# Patient Record
Sex: Male | Born: 1940
Health system: Southern US, Community
[De-identification: ages and names within clinical notes are randomized; demographics above are authoritative.]

## PROBLEM LIST (undated history)

## (undated) DIAGNOSIS — I2510 Atherosclerotic heart disease of native coronary artery without angina pectoris: Secondary | ICD-10-CM

## (undated) DIAGNOSIS — I509 Heart failure, unspecified: Secondary | ICD-10-CM

## (undated) DIAGNOSIS — H35341 Macular cyst, hole, or pseudohole, right eye: Secondary | ICD-10-CM

## (undated) DIAGNOSIS — R519 Headache, unspecified: Secondary | ICD-10-CM

## (undated) DIAGNOSIS — H35342 Macular cyst, hole, or pseudohole, left eye: Secondary | ICD-10-CM

## (undated) DIAGNOSIS — R112 Nausea with vomiting, unspecified: Secondary | ICD-10-CM

## (undated) DIAGNOSIS — J301 Allergic rhinitis due to pollen: Secondary | ICD-10-CM

## (undated) DIAGNOSIS — J45909 Unspecified asthma, uncomplicated: Secondary | ICD-10-CM

## (undated) DIAGNOSIS — R51 Headache: Secondary | ICD-10-CM

## (undated) DIAGNOSIS — I10 Essential (primary) hypertension: Secondary | ICD-10-CM

## (undated) DIAGNOSIS — Z9889 Other specified postprocedural states: Secondary | ICD-10-CM

## (undated) DIAGNOSIS — H33001 Unspecified retinal detachment with retinal break, right eye: Secondary | ICD-10-CM

## (undated) DIAGNOSIS — K429 Umbilical hernia without obstruction or gangrene: Secondary | ICD-10-CM

## (undated) DIAGNOSIS — I219 Acute myocardial infarction, unspecified: Secondary | ICD-10-CM

## (undated) DIAGNOSIS — G373 Acute transverse myelitis in demyelinating disease of central nervous system: Secondary | ICD-10-CM

## (undated) DIAGNOSIS — Z95810 Presence of automatic (implantable) cardiac defibrillator: Secondary | ICD-10-CM

## (undated) DIAGNOSIS — G709 Myoneural disorder, unspecified: Secondary | ICD-10-CM

## (undated) HISTORY — DX: Macular cyst, hole, or pseudohole, right eye: H35.341

## (undated) HISTORY — PX: EYE SURGERY: SHX253

## (undated) HISTORY — DX: Unspecified retinal detachment with retinal break, right eye: H33.001

## (undated) HISTORY — DX: Macular cyst, hole, or pseudohole, left eye: H35.342

---

## 2007-07-06 DIAGNOSIS — G373 Acute transverse myelitis in demyelinating disease of central nervous system: Secondary | ICD-10-CM

## 2007-07-06 HISTORY — DX: Acute transverse myelitis in demyelinating disease of central nervous system: G37.3

## 2007-12-21 ENCOUNTER — Emergency Department (HOSPITAL_COMMUNITY): Admission: EM | Admit: 2007-12-21 | Discharge: 2007-12-21 | Payer: Self-pay | Admitting: Emergency Medicine

## 2008-06-12 ENCOUNTER — Encounter: Admission: RE | Admit: 2008-06-12 | Discharge: 2008-06-12 | Payer: Self-pay | Admitting: Neurology

## 2010-07-27 ENCOUNTER — Encounter: Payer: Self-pay | Admitting: Neurology

## 2011-04-01 LAB — DIFFERENTIAL
Basophils Absolute: 0.1
Basophils Relative: 1
Eosinophils Absolute: 0.3
Eosinophils Relative: 2
Lymphocytes Relative: 22
Lymphs Abs: 3.7
Monocytes Absolute: 1.2 — ABNORMAL HIGH
Monocytes Relative: 7
Neutro Abs: 11.6 — ABNORMAL HIGH
Neutrophils Relative %: 69

## 2011-04-01 LAB — CBC
HCT: 45.8
Hemoglobin: 15.9
MCHC: 34.8
MCV: 91.7
Platelets: 229
RBC: 4.99
RDW: 12.2
WBC: 17 — ABNORMAL HIGH

## 2011-04-01 LAB — POCT I-STAT, CHEM 8
BUN: 17
Calcium, Ion: 1.09 — ABNORMAL LOW
Chloride: 105
Creatinine, Ser: 1.1
Glucose, Bld: 100 — ABNORMAL HIGH
HCT: 47
Hemoglobin: 16
Potassium: 4.4
Sodium: 138
TCO2: 26

## 2011-04-01 LAB — URINALYSIS, ROUTINE W REFLEX MICROSCOPIC
Bilirubin Urine: NEGATIVE
Glucose, UA: NEGATIVE
Hgb urine dipstick: NEGATIVE
Ketones, ur: NEGATIVE
Nitrite: NEGATIVE
Protein, ur: NEGATIVE
Specific Gravity, Urine: 1.029
Urobilinogen, UA: 1
pH: 6

## 2011-04-01 LAB — URINE MICROSCOPIC-ADD ON

## 2011-04-01 LAB — URINE CULTURE: Colony Count: 4000

## 2011-12-22 ENCOUNTER — Encounter (INDEPENDENT_AMBULATORY_CARE_PROVIDER_SITE_OTHER): Payer: Medicare Other | Admitting: Ophthalmology

## 2011-12-22 DIAGNOSIS — H35379 Puckering of macula, unspecified eye: Secondary | ICD-10-CM

## 2011-12-22 DIAGNOSIS — H2510 Age-related nuclear cataract, unspecified eye: Secondary | ICD-10-CM

## 2011-12-22 DIAGNOSIS — H43819 Vitreous degeneration, unspecified eye: Secondary | ICD-10-CM

## 2011-12-22 DIAGNOSIS — H35349 Macular cyst, hole, or pseudohole, unspecified eye: Secondary | ICD-10-CM

## 2011-12-23 DIAGNOSIS — H35342 Macular cyst, hole, or pseudohole, left eye: Secondary | ICD-10-CM

## 2011-12-23 HISTORY — DX: Macular cyst, hole, or pseudohole, left eye: H35.342

## 2011-12-23 NOTE — H&P (Signed)
Robert [name] is an 71 y.o. male.   Chief Complaint:loss of vision left eye HPI: recent vision loss from macular hole  No past medical history on file.  No past surgical history on file.  No family history on file. Social History:  does not have a smoking history on file. He does not have any smokeless tobacco history on file. His alcohol and drug histories not on file.  Allergies: Allergies not on file  No prescriptions prior to admission    Review of systems otherwise negative  There were no vitals taken for this visit.  Physical exam: Mental status: oriented x3. Eyes: See eye exam associated with this date of surgery in media tab.  Scanned in by scanning center Ears, Nose, Throat: within normal limits Neck: Within Normal limits General: within normal limits Chest: Within normal limits Breast: deferred Heart: Within normal limits Abdomen: Within normal limits GU: deferred Extremities: within normal limits Skin: within normal limits  Assessment/Plan Macular hole type II b Plan: To [facility] for pars plana vitretcomy, laser treatment, gas injection, serum patch left eye  Robert [name] 12/23/2011, 8:25 AM

## 2012-01-05 ENCOUNTER — Encounter (HOSPITAL_COMMUNITY): Payer: Self-pay | Admitting: Pharmacy Technician

## 2012-01-14 ENCOUNTER — Encounter (HOSPITAL_COMMUNITY): Payer: Self-pay | Admitting: *Deleted

## 2012-01-17 MED ORDER — TROPICAMIDE 1 % OP SOLN
1.0000 [drp] | OPHTHALMIC | Status: AC | PRN
  Administered 2012-01-18 (×3): 1 [drp] via OPHTHALMIC
  Filled 2012-01-17: qty 3

## 2012-01-17 MED ORDER — GATIFLOXACIN 0.5 % OP SOLN
1.0000 [drp] | OPHTHALMIC | Status: AC | PRN
  Administered 2012-01-18 (×3): 1 [drp] via OPHTHALMIC
  Filled 2012-01-17: qty 2.5

## 2012-01-17 MED ORDER — CYCLOPENTOLATE HCL 1 % OP SOLN
1.0000 [drp] | OPHTHALMIC | Status: AC | PRN
  Administered 2012-01-18 (×3): 1 [drp] via OPHTHALMIC
  Filled 2012-01-17: qty 2

## 2012-01-17 MED ORDER — PHENYLEPHRINE HCL 2.5 % OP SOLN
1.0000 [drp] | OPHTHALMIC | Status: AC | PRN
  Administered 2012-01-18 (×3): 1 [drp] via OPHTHALMIC
  Filled 2012-01-17: qty 3

## 2012-01-17 MED ORDER — CEFAZOLIN SODIUM-DEXTROSE 2-3 GM-% IV SOLR
2.0000 g | INTRAVENOUS | Status: AC
  Administered 2012-01-18: 2 g via INTRAVENOUS
  Filled 2012-01-17: qty 50

## 2012-01-18 ENCOUNTER — Encounter (HOSPITAL_COMMUNITY): Payer: Self-pay | Admitting: Anesthesiology

## 2012-01-18 ENCOUNTER — Ambulatory Visit (HOSPITAL_COMMUNITY): Payer: Medicare Other | Admitting: Anesthesiology

## 2012-01-18 ENCOUNTER — Ambulatory Visit (HOSPITAL_COMMUNITY)
Admission: RE | Admit: 2012-01-18 | Discharge: 2012-01-19 | Disposition: A | Discharge status: Home / Self Care | Payer: Medicare Other | Source: Ambulatory Visit | Attending: Ophthalmology | Admitting: Ophthalmology

## 2012-01-18 ENCOUNTER — Encounter (HOSPITAL_COMMUNITY)
Admission: RE | Disposition: A | Discharge status: Home / Self Care | Payer: Self-pay | Source: Ambulatory Visit | Attending: Ophthalmology

## 2012-01-18 ENCOUNTER — Ambulatory Visit (HOSPITAL_COMMUNITY): Payer: Medicare Other

## 2012-01-18 ENCOUNTER — Encounter (HOSPITAL_COMMUNITY): Payer: Self-pay | Admitting: General Practice

## 2012-01-18 DIAGNOSIS — H35342 Macular cyst, hole, or pseudohole, left eye: Secondary | ICD-10-CM

## 2012-01-18 DIAGNOSIS — H35349 Macular cyst, hole, or pseudohole, unspecified eye: Secondary | ICD-10-CM | POA: Insufficient documentation

## 2012-01-18 DIAGNOSIS — F17200 Nicotine dependence, unspecified, uncomplicated: Secondary | ICD-10-CM | POA: Insufficient documentation

## 2012-01-18 DIAGNOSIS — K219 Gastro-esophageal reflux disease without esophagitis: Secondary | ICD-10-CM | POA: Insufficient documentation

## 2012-01-18 HISTORY — DX: Umbilical hernia without obstruction or gangrene: K42.9

## 2012-01-18 HISTORY — DX: Acute transverse myelitis in demyelinating disease of central nervous system: G37.3

## 2012-01-18 HISTORY — PX: PARS PLANA VITRECTOMY: SHX2166

## 2012-01-18 HISTORY — DX: Allergic rhinitis due to pollen: J30.1

## 2012-01-18 LAB — CBC
HCT: 41.6 % (ref 39.0–52.0)
Hemoglobin: 14.7 g/dL (ref 13.0–17.0)
MCH: 32.5 pg (ref 26.0–34.0)
MCHC: 35.3 g/dL (ref 30.0–36.0)
MCV: 91.8 fL (ref 78.0–100.0)
Platelets: 186 10*3/uL (ref 150–400)
RBC: 4.53 MIL/uL (ref 4.22–5.81)
RDW: 12.6 % (ref 11.5–15.5)
WBC: 13.4 10*3/uL — ABNORMAL HIGH (ref 4.0–10.5)

## 2012-01-18 LAB — SURGICAL PCR SCREEN
MRSA, PCR: NEGATIVE
Staphylococcus aureus: NEGATIVE

## 2012-01-18 LAB — AUTOLOGOUS SERUM PATCH PREP

## 2012-01-18 SURGERY — PARS PLANA VITRECTOMY WITH 25 GAUGE
Anesthesia: General | Site: Eye | Laterality: Left | Wound class: Clean

## 2012-01-18 MED ORDER — DEXAMETHASONE SODIUM PHOSPHATE 10 MG/ML IJ SOLN
INTRAMUSCULAR | Status: AC
  Filled 2012-01-18: qty 1

## 2012-01-18 MED ORDER — LACTATED RINGERS IV SOLN
INTRAVENOUS | Status: DC | PRN
  Administered 2012-01-18 (×2): via INTRAVENOUS

## 2012-01-18 MED ORDER — MUPIROCIN 2 % EX OINT
TOPICAL_OINTMENT | Freq: Two times a day (BID) | CUTANEOUS | Status: DC
  Filled 2012-01-18: qty 22

## 2012-01-18 MED ORDER — GENTAMICIN SULFATE 40 MG/ML IJ SOLN
INTRAMUSCULAR | Status: AC
  Filled 2012-01-18: qty 2

## 2012-01-18 MED ORDER — MAGNESIUM HYDROXIDE 400 MG/5ML PO SUSP: 15.0000 mL | Freq: Four times a day (QID) | ORAL | Status: DC | PRN

## 2012-01-18 MED ORDER — LATANOPROST 0.005 % OP SOLN
1.0000 [drp] | Freq: Every day | OPHTHALMIC | Status: DC
  Filled 2012-01-18: qty 2.5

## 2012-01-18 MED ORDER — HYDROMORPHONE HCL PF 1 MG/ML IJ SOLN
0.2500 mg | INTRAMUSCULAR | Status: DC | PRN
  Administered 2012-01-18: 0.5 mg via INTRAVENOUS

## 2012-01-18 MED ORDER — GATIFLOXACIN 0.5 % OP SOLN
1.0000 [drp] | Freq: Four times a day (QID) | OPHTHALMIC | Status: DC
  Filled 2012-01-18: qty 2.5

## 2012-01-18 MED ORDER — BRIMONIDINE TARTRATE 0.2 % OP SOLN
1.0000 [drp] | Freq: Two times a day (BID) | OPHTHALMIC | Status: DC
  Filled 2012-01-18: qty 5

## 2012-01-18 MED ORDER — PHENYLEPHRINE HCL 10 MG/ML IJ SOLN
INTRAMUSCULAR | Status: DC | PRN
  Administered 2012-01-18 (×2): 80 ug via INTRAVENOUS
  Administered 2012-01-18: 160 ug via INTRAVENOUS

## 2012-01-18 MED ORDER — MINERAL OIL LIGHT 100 % EX OIL
TOPICAL_OIL | CUTANEOUS | Status: AC
  Filled 2012-01-18: qty 25

## 2012-01-18 MED ORDER — PHENYLEPHRINE HCL 10 MG/ML IJ SOLN
20.0000 mg | INTRAVENOUS | Status: DC | PRN
  Administered 2012-01-18: 50 ug/min via INTRAVENOUS

## 2012-01-18 MED ORDER — FENTANYL CITRATE 0.05 MG/ML IJ SOLN
INTRAMUSCULAR | Status: DC | PRN
  Administered 2012-01-18: 100 ug via INTRAVENOUS

## 2012-01-18 MED ORDER — TEMAZEPAM 15 MG PO CAPS: 15.0000 mg | ORAL_CAPSULE | Freq: Every evening | ORAL | Status: DC | PRN

## 2012-01-18 MED ORDER — PROPOFOL 10 MG/ML IV EMUL
INTRAVENOUS | Status: DC | PRN
  Administered 2012-01-18: 170 mg via INTRAVENOUS

## 2012-01-18 MED ORDER — MIDAZOLAM HCL 2 MG/2ML IJ SOLN: 0.5000 mg | Freq: Once | INTRAMUSCULAR | Status: DC | PRN

## 2012-01-18 MED ORDER — LIDOCAINE HCL (CARDIAC) 20 MG/ML IV SOLN
INTRAVENOUS | Status: DC | PRN
  Administered 2012-01-18: 80 mg via INTRAVENOUS

## 2012-01-18 MED ORDER — BSS PLUS IO SOLN
INTRAOCULAR | Status: AC
  Filled 2012-01-18: qty 500

## 2012-01-18 MED ORDER — BSS PLUS IO SOLN
INTRAOCULAR | Status: DC | PRN
  Administered 2012-01-18: 1 via OPHTHALMIC

## 2012-01-18 MED ORDER — MIDAZOLAM HCL 5 MG/5ML IJ SOLN
INTRAMUSCULAR | Status: DC | PRN
  Administered 2012-01-18: 2 mg via INTRAVENOUS

## 2012-01-18 MED ORDER — PREDNISOLONE ACETATE 1 % OP SUSP
1.0000 [drp] | Freq: Four times a day (QID) | OPHTHALMIC | Status: DC
  Filled 2012-01-18: qty 1

## 2012-01-18 MED ORDER — BSS IO SOLN
INTRAOCULAR | Status: AC
  Filled 2012-01-18: qty 15

## 2012-01-18 MED ORDER — BACITRACIN-POLYMYXIN B 500-10000 UNIT/GM OP OINT
TOPICAL_OINTMENT | OPHTHALMIC | Status: DC | PRN
  Administered 2012-01-18: 1 via OPHTHALMIC

## 2012-01-18 MED ORDER — EPINEPHRINE HCL 1 MG/ML IJ SOLN
INTRAMUSCULAR | Status: AC
  Filled 2012-01-18: qty 1

## 2012-01-18 MED ORDER — ACETAZOLAMIDE SODIUM 500 MG IJ SOLR
INTRAMUSCULAR | Status: AC
  Filled 2012-01-18: qty 500

## 2012-01-18 MED ORDER — MEPERIDINE HCL 25 MG/ML IJ SOLN: 6.2500 mg | INTRAMUSCULAR | Status: DC | PRN

## 2012-01-18 MED ORDER — LIDOCAINE HCL 2 % IJ SOLN
INTRAMUSCULAR | Status: AC
  Filled 2012-01-18: qty 1

## 2012-01-18 MED ORDER — HEMOSTATIC AGENTS (NO CHARGE) OPTIME
TOPICAL | Status: DC | PRN
  Administered 2012-01-18: 1 via TOPICAL

## 2012-01-18 MED ORDER — ATROPINE SULFATE 1 % OP SOLN
OPHTHALMIC | Status: AC
  Filled 2012-01-18: qty 2

## 2012-01-18 MED ORDER — ACETAZOLAMIDE SODIUM 500 MG IJ SOLR
1000.0000 mg | Freq: Once | INTRAMUSCULAR | Status: AC
  Administered 2012-01-19: 1000 mg via INTRAVENOUS
  Filled 2012-01-18: qty 1000

## 2012-01-18 MED ORDER — ACETAMINOPHEN 10 MG/ML IV SOLN
INTRAVENOUS | Status: DC | PRN
  Administered 2012-01-18: 1000 mg via INTRAVENOUS

## 2012-01-18 MED ORDER — MORPHINE SULFATE 2 MG/ML IJ SOLN: 1.0000 mg | INTRAMUSCULAR | Status: DC | PRN

## 2012-01-18 MED ORDER — ATROPINE SULFATE 1 % OP SOLN
OPHTHALMIC | Status: DC | PRN
  Administered 2012-01-18: 2 [drp] via OPHTHALMIC

## 2012-01-18 MED ORDER — GENTAMICIN SULFATE 40 MG/ML IJ SOLN
INTRAMUSCULAR | Status: DC | PRN
  Administered 2012-01-18: 80 mg

## 2012-01-18 MED ORDER — PHENYLEPHRINE HCL 10 MG PO TABS: 10.0000 mg | ORAL_TABLET | ORAL | Status: DC | PRN

## 2012-01-18 MED ORDER — BUPIVACAINE HCL 0.75 % IJ SOLN
INTRAMUSCULAR | Status: AC
  Filled 2012-01-18: qty 10

## 2012-01-18 MED ORDER — GLYCOPYRROLATE 0.2 MG/ML IJ SOLN
INTRAMUSCULAR | Status: DC | PRN
  Administered 2012-01-18: 0.2 mg via INTRAVENOUS
  Administered 2012-01-18: 0.4 mg via INTRAVENOUS
  Administered 2012-01-18: 0.2 mg via INTRAVENOUS

## 2012-01-18 MED ORDER — BACITRACIN-POLYMYXIN B 500-10000 UNIT/GM OP OINT
1.0000 "application " | TOPICAL_OINTMENT | Freq: Four times a day (QID) | OPHTHALMIC | Status: DC
  Filled 2012-01-18: qty 3.5

## 2012-01-18 MED ORDER — ACETAMINOPHEN 325 MG PO TABS
325.0000 mg | ORAL_TABLET | ORAL | Status: DC | PRN
  Administered 2012-01-19: 650 mg via ORAL
  Filled 2012-01-18: qty 2

## 2012-01-18 MED ORDER — TETRACAINE HCL 0.5 % OP SOLN
2.0000 [drp] | Freq: Once | OPHTHALMIC | Status: DC
  Filled 2012-01-18: qty 2

## 2012-01-18 MED ORDER — SODIUM HYALURONATE 10 MG/ML IO SOLN
INTRAOCULAR | Status: DC | PRN
  Administered 2012-01-18: 0.85 mL via INTRAOCULAR

## 2012-01-18 MED ORDER — BACITRACIN-POLYMYXIN B 500-10000 UNIT/GM OP OINT
TOPICAL_OINTMENT | OPHTHALMIC | Status: AC
  Filled 2012-01-18: qty 3.5

## 2012-01-18 MED ORDER — POLYMYXIN B SULFATE 500000 UNITS IJ SOLR
INTRAMUSCULAR | Status: DC | PRN
  Administered 2012-01-18: 500000 [IU]

## 2012-01-18 MED ORDER — POLYMYXIN B SULFATE 500000 UNITS IJ SOLR
INTRAMUSCULAR | Status: AC
  Filled 2012-01-18: qty 1

## 2012-01-18 MED ORDER — PSEUDOEPHEDRINE HCL 30 MG/5ML PO SYRP
10.0000 mg | ORAL_SOLUTION | ORAL | Status: DC | PRN
  Filled 2012-01-18: qty 1.7

## 2012-01-18 MED ORDER — ROCURONIUM BROMIDE 100 MG/10ML IV SOLN
INTRAVENOUS | Status: DC | PRN
  Administered 2012-01-18: 50 mg via INTRAVENOUS

## 2012-01-18 MED ORDER — SODIUM CHLORIDE 0.9 % IJ SOLN
INTRAMUSCULAR | Status: DC | PRN
  Administered 2012-01-18: 10 mL

## 2012-01-18 MED ORDER — TRIAMCINOLONE ACETONIDE 40 MG/ML IJ SUSP
INTRAMUSCULAR | Status: AC
  Filled 2012-01-18: qty 1

## 2012-01-18 MED ORDER — OXYCODONE-ACETAMINOPHEN 5-325 MG PO TABS: 1.0000 | ORAL_TABLET | ORAL | Status: DC | PRN

## 2012-01-18 MED ORDER — DEXAMETHASONE SODIUM PHOSPHATE 10 MG/ML IJ SOLN
INTRAMUSCULAR | Status: DC | PRN
  Administered 2012-01-18: 10 mg via INTRAVENOUS

## 2012-01-18 MED ORDER — ACETAMINOPHEN 10 MG/ML IV SOLN
INTRAVENOUS | Status: AC
  Filled 2012-01-18: qty 100

## 2012-01-18 MED ORDER — DEXTROSE 5 % IV SOLN
INTRAVENOUS | Status: DC | PRN
  Administered 2012-01-18: 12:00:00 via INTRAVENOUS

## 2012-01-18 MED ORDER — BUPIVACAINE HCL 0.75 % IJ SOLN
INTRAMUSCULAR | Status: DC | PRN
  Administered 2012-01-18: 10 mL

## 2012-01-18 MED ORDER — MUPIROCIN 2 % EX OINT
TOPICAL_OINTMENT | CUTANEOUS | Status: AC
  Administered 2012-01-18: 10:00:00 via NASAL
  Filled 2012-01-18: qty 22

## 2012-01-18 MED ORDER — NEOSTIGMINE METHYLSULFATE 1 MG/ML IJ SOLN
INTRAMUSCULAR | Status: DC | PRN
  Administered 2012-01-18: 3 mg via INTRAVENOUS

## 2012-01-18 MED ORDER — SODIUM HYALURONATE 10 MG/ML IO SOLN
INTRAOCULAR | Status: AC
  Filled 2012-01-18: qty 0.85

## 2012-01-18 MED ORDER — HYPROMELLOSE (GONIOSCOPIC) 2.5 % OP SOLN
OPHTHALMIC | Status: AC
  Filled 2012-01-18: qty 15

## 2012-01-18 MED ORDER — DOCUSATE SODIUM 100 MG PO CAPS
100.0000 mg | ORAL_CAPSULE | Freq: Two times a day (BID) | ORAL | Status: DC
  Filled 2012-01-18: qty 1

## 2012-01-18 MED ORDER — SODIUM CHLORIDE 0.45 % IV SOLN
INTRAVENOUS | Status: DC
  Administered 2012-01-18: 1000 mL via INTRAVENOUS

## 2012-01-18 MED ORDER — ONDANSETRON HCL 4 MG/2ML IJ SOLN: 4.0000 mg | Freq: Four times a day (QID) | INTRAMUSCULAR | Status: DC | PRN

## 2012-01-18 MED ORDER — ONDANSETRON HCL 4 MG/2ML IJ SOLN
INTRAMUSCULAR | Status: DC | PRN
  Administered 2012-01-18: 4 mg via INTRAVENOUS

## 2012-01-18 MED ORDER — EPINEPHRINE HCL 1 MG/ML IJ SOLN
INTRAMUSCULAR | Status: DC | PRN
  Administered 2012-01-18: 1 mg

## 2012-01-18 MED ORDER — PROMETHAZINE HCL 25 MG/ML IJ SOLN: 6.2500 mg | INTRAMUSCULAR | Status: DC | PRN

## 2012-01-18 MED ORDER — HETASTARCH-ELECTROLYTES 6 % IV SOLN
INTRAVENOUS | Status: DC | PRN
  Administered 2012-01-18: 13:00:00 via INTRAVENOUS

## 2012-01-18 MED ORDER — 0.9 % SODIUM CHLORIDE (POUR BTL) OPTIME
TOPICAL | Status: DC | PRN
  Administered 2012-01-18: 1000 mL

## 2012-01-18 MED ORDER — HYDROMORPHONE HCL PF 1 MG/ML IJ SOLN
INTRAMUSCULAR | Status: AC
  Filled 2012-01-18: qty 1

## 2012-01-18 SURGICAL SUPPLY — 67 items
APPLICATOR DR MATTHEWS STRL (MISCELLANEOUS) IMPLANT
BLADE EYE CATARACT 19 1.4 BEAV (BLADE) IMPLANT
BLADE MVR KNIFE 19G (BLADE) IMPLANT
BLADE MVR KNIFE 20G (BLADE) ×2 IMPLANT
CANNULA DUAL BORE 23G (CANNULA) IMPLANT
CANNULA FLEX TIP 25G (CANNULA) ×2 IMPLANT
CLOTH BEACON ORANGE TIMEOUT ST (SAFETY) ×2 IMPLANT
CORDS BIPOLAR (ELECTRODE) ×2 IMPLANT
COTTONBALL LRG STERILE PKG (GAUZE/BANDAGES/DRESSINGS) ×6 IMPLANT
DRAPE INCISE 51X51 W/FILM STRL (DRAPES) ×2 IMPLANT
DRAPE OPHTHALMIC 77X100 STRL (CUSTOM PROCEDURE TRAY) ×2 IMPLANT
FILTER BLUE MILLIPORE (MISCELLANEOUS) ×4 IMPLANT
FILTER STRAW FLUID ASPIR (MISCELLANEOUS) ×2 IMPLANT
FORCEPS ECKARDT ILM 25G SERR (OPHTHALMIC RELATED) IMPLANT
GAS OPHTHALMIC (MISCELLANEOUS) ×2 IMPLANT
GLOVE SS BIOGEL STRL SZ 6.5 (GLOVE) ×1 IMPLANT
GLOVE SS BIOGEL STRL SZ 7 (GLOVE) ×1 IMPLANT
GLOVE SUPERSENSE BIOGEL SZ 6.5 (GLOVE) ×1
GLOVE SUPERSENSE BIOGEL SZ 7 (GLOVE) ×1
GLOVE SURG 8.5 LATEX PF (GLOVE) ×2 IMPLANT
GOWN STRL NON-REIN LRG LVL3 (GOWN DISPOSABLE) ×12 IMPLANT
ILLUMINATOR CHOW PICK 25GA (MISCELLANEOUS) ×4 IMPLANT
KIT BASIN OR (CUSTOM PROCEDURE TRAY) ×2 IMPLANT
KIT ROOM TURNOVER OR (KITS) ×2 IMPLANT
KNIFE CRESCENT 2.5 55 ANG (BLADE) IMPLANT
LENS BIOM SUPER VIEW SET DISP (OPHTHALMIC RELATED) IMPLANT
MARKER SKIN DUAL TIP RULER LAB (MISCELLANEOUS) IMPLANT
MASK EYE SHIELD (GAUZE/BANDAGES/DRESSINGS) ×2 IMPLANT
MICROPICK 25G (MISCELLANEOUS)
NEEDLE 18GX1X1/2 (RX/OR ONLY) (NEEDLE) ×2 IMPLANT
NEEDLE 25GX 5/8IN NON SAFETY (NEEDLE) ×2 IMPLANT
NEEDLE 27GAX1X1/2 (NEEDLE) IMPLANT
NEEDLE FILTER BLUNT 18X 1/2SAF (NEEDLE) ×1
NEEDLE FILTER BLUNT 18X1 1/2 (NEEDLE) ×1 IMPLANT
NEEDLE HYPO 30X.5 LL (NEEDLE) ×4 IMPLANT
NS IRRIG 1000ML POUR BTL (IV SOLUTION) ×2 IMPLANT
PACK VITRECTOMY CUSTOM (CUSTOM PROCEDURE TRAY) ×2 IMPLANT
PAD ARMBOARD 7.5X6 YLW CONV (MISCELLANEOUS) ×4 IMPLANT
PAD EYE OVAL STERILE LF (GAUZE/BANDAGES/DRESSINGS) ×2 IMPLANT
PAK VITRECTOMY PIK 25 GA (OPHTHALMIC RELATED) ×2 IMPLANT
PENCIL BIPOLAR 25GA STR DISP (OPHTHALMIC RELATED) IMPLANT
PICK MICROPICK 25G (MISCELLANEOUS) IMPLANT
PROBE DIRECTIONAL LASER (MISCELLANEOUS) IMPLANT
REPL STRA BRUSH NEEDLE (NEEDLE) ×2 IMPLANT
RESERVOIR BACK FLUSH (MISCELLANEOUS) ×2 IMPLANT
ROLLS DENTAL (MISCELLANEOUS) ×4 IMPLANT
SCRAPER DIAMOND 25GA (OPHTHALMIC RELATED) ×2 IMPLANT
SCRAPER DIAMOND DUST MEMBRANE (MISCELLANEOUS) IMPLANT
SPONGE SURGIFOAM ABS GEL 12-7 (HEMOSTASIS) ×2 IMPLANT
STOPCOCK 4 WAY LG BORE MALE ST (IV SETS) IMPLANT
SUT CHROMIC 7 0 TG140 8 (SUTURE) IMPLANT
SUT ETHILON 10 0 CS140 6 (SUTURE) IMPLANT
SUT ETHILON 9 0 TG140 8 (SUTURE) ×2 IMPLANT
SUT POLY NON ABSORB 10-0 8 STR (SUTURE) IMPLANT
SUT SILK 4 0 RB 1 (SUTURE) IMPLANT
SYR 20CC LL (SYRINGE) IMPLANT
SYR 50ML LL SCALE MARK (SYRINGE) ×2 IMPLANT
SYR 5ML LL (SYRINGE) IMPLANT
SYR BULB 3OZ (MISCELLANEOUS) ×2 IMPLANT
SYR TB 1ML LUER SLIP (SYRINGE) ×2 IMPLANT
SYRINGE 10CC LL (SYRINGE) ×4 IMPLANT
TAPE SURG TRANSPORE 1 IN (GAUZE/BANDAGES/DRESSINGS) ×1 IMPLANT
TAPE SURGICAL TRANSPORE 1 IN (GAUZE/BANDAGES/DRESSINGS) ×1
TOWEL OR 17X24 6PK STRL BLUE (TOWEL DISPOSABLE) ×6 IMPLANT
TROCAR CANNULA 25GA (CANNULA) IMPLANT
WATER STERILE IRR 1000ML POUR (IV SOLUTION) ×2 IMPLANT
WIPE INSTRUMENT VISIWIPE 73X73 (MISCELLANEOUS) ×2 IMPLANT

## 2012-01-18 NOTE — Brief Op Note (Signed)
Brief Operative note   Preoperative diagnosis:  Pre-Op Diagnosis Codes:    * Macular cyst, hole, or pseudohole of retina [362.54] Postoperative diagnosis  Post-Op Diagnosis Codes:    * Macular cyst, hole, or pseudohole of retina [362.54]  Procedures: Repair of macular hole with pars plana vitrectomy, laser, serum patch, gas injection, left eye  Surgeon:  Sherrie George, MD...  Assistant:  Rosalie Doctor SA   Anesthesia: General  Specimen: none  Estimated blood loss:  1cc  Complications: none  Patient sent to PACU in good condition  Composed by Sherrie George MD  Dictation number: [id number]

## 2012-01-18 NOTE — H&P (Signed)
I examined the patient today and there is no change in the medical status 

## 2012-01-18 NOTE — OR Nursing (Signed)
Gas bubble alert band to patients LEFT wrist and Alcon precaution card to PACU with patient.

## 2012-01-18 NOTE — Transfer of Care (Signed)
Immediate Anesthesia Transfer of Care Note  Patient: Robert [name]  Procedure(s) Performed: Procedure(s) (LRB): PARS PLANA VITRECTOMY WITH 25 GAUGE (Left)  Patient Location: PACU  Anesthesia Type: General  Level of Consciousness: sedated and patient cooperative  Airway & Oxygen Therapy: Patient Spontanous Breathing and Patient connected to nasal cannula oxygen  Post-op Assessment: Report given to PACU RN and Post -op Vital signs reviewed and stable  Post vital signs: Reviewed and stable  Complications: No apparent anesthesia complications

## 2012-01-18 NOTE — Anesthesia Postprocedure Evaluation (Signed)
  Anesthesia Post-op Note  Patient: Robert [name]  Procedure(s) Performed: Procedure(s) (LRB): PARS PLANA VITRECTOMY WITH 25 GAUGE (Left)  Patient Location: PACU  Anesthesia Type: General  Level of Consciousness: awake, alert  and oriented  Airway and Oxygen Therapy: Patient Spontanous Breathing  Post-op Pain: none  Post-op Assessment: Post-op Vital signs reviewed, Patient's Cardiovascular Status Stable, Respiratory Function Stable, Patent Airway, No signs of Nausea or vomiting and Pain level controlled  Post-op Vital Signs: Reviewed and stable  Complications: No apparent anesthesia complications

## 2012-01-18 NOTE — Preoperative (Signed)
Beta Blockers   Reason not to administer Beta Blockers:Not Applicable 

## 2012-01-18 NOTE — Anesthesia Preprocedure Evaluation (Addendum)
Anesthesia Evaluation  Patient identified by MRN, date of birth, ID band Patient awake    Reviewed: Allergy & Precautions, H&P , NPO status , Patient's Chart, lab work & pertinent test results  Airway Mallampati: II TM Distance: >3 FB Neck ROM: Full    Dental No notable dental hx. (+) Caps, Teeth Intact, Dental Advisory Given and Chipped,    Pulmonary Current Smoker,  breath sounds clear to auscultation  Pulmonary exam normal       Cardiovascular negative cardio ROS  Rhythm:Regular Rate:Normal     Neuro/Psych H/o transverse myelitis 2008 negative neurological ROS     GI/Hepatic Neg liver ROS, GERD-  Controlled,  Endo/Other  negative endocrine ROS  Renal/GU negative Renal ROS     Musculoskeletal   Abdominal   Peds  Hematology   Anesthesia Other Findings Results for Robert, [name] (MRN [id number]) as of 01/18/2012 08:23 09 12:33 Related encounter: Admission (Current) from 01/18/2012 in [facility]  Robert [name] is an 71 y.o. male.    Chief Complaint:loss of vision left eye HPI: recent vision loss from macular hole    Teeth with multiple crowns pre-molars, molars  A few chipped teeth  Results for Robert, [name] (MRN [id number]) as of 01/18/2012 11:23  01/18/2012 09:28 WBC: 13.4 (H) RBC: 4.53 Hemoglobin: 14.7 HCT: 41.6 MCV: 91.8 MCH: 32.5 MCHC: 35.3 RDW: 12.6 Platelets: 186   Reproductive/Obstetrics                       Anesthesia Physical Anesthesia Plan  ASA: II  Anesthesia Plan: General   Post-op Pain Management:    Induction: Intravenous  Airway Management Planned: Oral ETT  Additional Equipment:   Intra-op Plan:   Post-operative Plan: Extubation in OR  Informed Consent: I have reviewed the patients History and Physical, chart, labs and discussed the procedure including the risks, benefits and alternatives for the proposed anesthesia  with the patient or authorized representative who has indicated his/her understanding and acceptance.   Dental advisory given  Plan Discussed with: CRNA and Surgeon  Anesthesia Plan Comments: (Plan routine monitors, GETA)        Anesthesia Quick Evaluation

## 2012-01-19 ENCOUNTER — Encounter (HOSPITAL_COMMUNITY): Payer: Self-pay | Admitting: Ophthalmology

## 2012-01-19 ENCOUNTER — Encounter (INDEPENDENT_AMBULATORY_CARE_PROVIDER_SITE_OTHER): Payer: Medicare Other | Admitting: Ophthalmology

## 2012-01-19 MED ORDER — GATIFLOXACIN 0.5 % OP SOLN: 1.0000 [drp] | Freq: Four times a day (QID) | OPHTHALMIC | Status: DC

## 2012-01-19 MED ORDER — BRIMONIDINE TARTRATE 0.2 % OP SOLN: 1.0000 [drp] | Freq: Two times a day (BID) | OPHTHALMIC | Status: AC

## 2012-01-19 MED ORDER — LEVOBUNOLOL HCL 0.5 % OP SOLN
1.0000 [drp] | Freq: Two times a day (BID) | OPHTHALMIC | Status: DC
  Filled 2012-01-19: qty 5

## 2012-01-19 MED ORDER — LEVOBUNOLOL HCL 0.5 % OP SOLN: 1.0000 [drp] | Freq: Two times a day (BID) | OPHTHALMIC | Status: AC

## 2012-01-19 MED ORDER — PREDNISOLONE ACETATE 1 % OP SUSP: 1.0000 [drp] | Freq: Four times a day (QID) | OPHTHALMIC | Status: AC

## 2012-01-19 MED ORDER — LATANOPROST 0.005 % OP SOLN: 1.0000 [drp] | Freq: Every day | OPHTHALMIC | Status: AC

## 2012-01-19 MED ORDER — BACITRACIN-POLYMYXIN B 500-10000 UNIT/GM OP OINT: 1.0000 "application " | TOPICAL_OINTMENT | Freq: Four times a day (QID) | OPHTHALMIC | Status: AC

## 2012-01-19 NOTE — Op Note (Signed)
NAME:  MATHESON, VANDEHEI NO.:  [id number]  MEDICAL RECORD NO.:  [id number]  LOCATION:  6N27C                        FACILITY:  [facility]  PHYSICIAN:  Beulah Gandy. Ashley Royalty, M.D. DATE OF BIRTH:  Aug 27, 1940  DATE OF PROCEDURE:  01/18/2012 DATE OF DISCHARGE:                              OPERATIVE REPORT   ADMISSION DIAGNOSIS:  Macular hole, left eye.  PROCEDURES:  Retinal photocoagulation, pars plana vitrectomy, membrane peel, internal limiting membrane peel, serum patch, perfluoropropane injection, left eye.  SURGEON:  Beulah Gandy. Ashley Royalty, MD  ASSISTANT:  Rosalie Doctor, SA  ANESTHESIA:  General.  DETAILS:  After usual prep and drape, the indirect ophthalmoscope laser was moved into place.  Inspection of the periphery revealed several weak areas, 645 burns were placed around the retinal periphery in 2 rows, the power was 400 mW, 1000 microns each, and 0.1 seconds each.  Attention was then carried to the pars plana area where 25-gauge trocars were placed at 8 and 4 o'clock.  A 20-gauge MVR incision was made at 2 o'clock.  The contact lens ring was anchored into place at 6 and 12 o'clock.  Pars plana vitrectomy was begun just behind the crystalline lens.  The vitrectomy was carried posteriorly in a core fashion down to the macular surface.  Once the entire core vitreous was removed, the silicone tip suction line was drawn down to the macular region and the fish strike sign occurred.  The posterior hyaloid was lifted from the edges of the macular hole and the edge of the disk.  The cutter was used to engage and remove these particles.  The magnifying contact lens was placed on the eye and the diamond-dusted membrane scraper was used to peel the internal limiting membrane around the edge of the macular hole. Once this membrane was removed and peeled, the wide field 30-degree prismatic lens was brought onto the cornea.  The vitrectomy was carried into the mid vitreous and then  into the peripheral vitreous to remove all available vitreous.  Once this was accomplished, a total gas-fluid exchange was carried out.  Sufficient time was allowed for additional fluid to track down the walls of the eye and collect in the posterior segment.  Serum patch was prepared during this time, and the perfluoropropane 15% mixture was prepared during this time.  Additional fluid was then removed with the [employer] brush.  The serum patch was delivered.  The C3F8 15% was exchanged for intravitreal gas.  The instruments were removed from the eye.  A 9-0 nylon was used to close the sclerotomy site at 2 o'clock.  Wet-field cautery was used to close the conjunctiva.  Polymyxin and gentamicin were irrigated into [place] space.  Atropine solution was applied.  Marcaine was injected around the globe for postop pain.  Decadron 10 mg was injected into the lower subconjunctival space.  Closing pressure was 10 with a Barraquer tonometer.  Complications were none. Duration 1 hour. Polysporin, a patch and shield were placed.  The patient was awakened, taken to recovery in satisfactory condition.     Beulah Gandy. Ashley Royalty, M.D.     JDM/MEDQ  D:  01/18/2012  T:  01/19/2012  Job:  [id number]

## 2012-01-19 NOTE — Discharge Summary (Signed)
Discharge summary not needed on [facility] patients per medical records. 

## 2012-01-19 NOTE — Progress Notes (Signed)
01/19/2012, 6:56 AM  Mental Status:  Awake, Alert, Oriented  Anterior segment: Cornea  Clear    Anterior Chamber Clear    Lens:   Cataract  Intra Ocular Pressure 30 mmHg with Tonopen  Vitreous: Clear 95%gas bubble   Retina:  Attached Good laser reaction   Impression: Excellent result Retina attached   Final Diagnosis: Active Problems:  Macular hole of left eye   Plan: start post operative eye drops.  Discharge to home.  Give post operative instructions  Sherrie George 01/19/2012, 6:56 AM

## 2012-01-31 ENCOUNTER — Ambulatory Visit (INDEPENDENT_AMBULATORY_CARE_PROVIDER_SITE_OTHER): Payer: Medicare Other | Admitting: Ophthalmology

## 2012-01-31 DIAGNOSIS — S058X9A Other injuries of unspecified eye and orbit, initial encounter: Secondary | ICD-10-CM

## 2012-01-31 DIAGNOSIS — H35349 Macular cyst, hole, or pseudohole, unspecified eye: Secondary | ICD-10-CM

## 2012-01-31 DIAGNOSIS — H2100 Hyphema, unspecified eye: Secondary | ICD-10-CM

## 2012-02-01 ENCOUNTER — Ambulatory Visit (INDEPENDENT_AMBULATORY_CARE_PROVIDER_SITE_OTHER): Payer: Medicare Other | Admitting: Ophthalmology

## 2012-02-01 DIAGNOSIS — H35349 Macular cyst, hole, or pseudohole, unspecified eye: Secondary | ICD-10-CM

## 2012-02-01 DIAGNOSIS — H2100 Hyphema, unspecified eye: Secondary | ICD-10-CM

## 2012-02-02 ENCOUNTER — Ambulatory Visit (INDEPENDENT_AMBULATORY_CARE_PROVIDER_SITE_OTHER): Payer: Medicare Other | Admitting: Ophthalmology

## 2012-02-02 DIAGNOSIS — H35349 Macular cyst, hole, or pseudohole, unspecified eye: Secondary | ICD-10-CM

## 2012-02-02 DIAGNOSIS — H2100 Hyphema, unspecified eye: Secondary | ICD-10-CM

## 2012-02-04 ENCOUNTER — Encounter (INDEPENDENT_AMBULATORY_CARE_PROVIDER_SITE_OTHER): Payer: Medicare Other | Admitting: Ophthalmology

## 2012-02-04 DIAGNOSIS — H35349 Macular cyst, hole, or pseudohole, unspecified eye: Secondary | ICD-10-CM

## 2012-02-04 DIAGNOSIS — H2100 Hyphema, unspecified eye: Secondary | ICD-10-CM

## 2012-02-07 ENCOUNTER — Encounter (INDEPENDENT_AMBULATORY_CARE_PROVIDER_SITE_OTHER): Payer: Medicare Other | Admitting: Ophthalmology

## 2012-02-07 DIAGNOSIS — H2100 Hyphema, unspecified eye: Secondary | ICD-10-CM

## 2012-02-07 DIAGNOSIS — H35349 Macular cyst, hole, or pseudohole, unspecified eye: Secondary | ICD-10-CM

## 2012-02-09 ENCOUNTER — Encounter (INDEPENDENT_AMBULATORY_CARE_PROVIDER_SITE_OTHER): Payer: Medicare Other | Admitting: Ophthalmology

## 2012-02-09 DIAGNOSIS — H35349 Macular cyst, hole, or pseudohole, unspecified eye: Secondary | ICD-10-CM

## 2012-02-09 DIAGNOSIS — H2100 Hyphema, unspecified eye: Secondary | ICD-10-CM

## 2012-02-10 ENCOUNTER — Encounter (INDEPENDENT_AMBULATORY_CARE_PROVIDER_SITE_OTHER): Payer: Medicare Other | Admitting: Ophthalmology

## 2012-02-10 DIAGNOSIS — H35349 Macular cyst, hole, or pseudohole, unspecified eye: Secondary | ICD-10-CM

## 2012-02-10 DIAGNOSIS — H2100 Hyphema, unspecified eye: Secondary | ICD-10-CM

## 2012-02-14 ENCOUNTER — Encounter (INDEPENDENT_AMBULATORY_CARE_PROVIDER_SITE_OTHER): Payer: Medicare Other | Admitting: Ophthalmology

## 2012-02-14 DIAGNOSIS — H35349 Macular cyst, hole, or pseudohole, unspecified eye: Secondary | ICD-10-CM

## 2012-02-18 ENCOUNTER — Encounter (INDEPENDENT_AMBULATORY_CARE_PROVIDER_SITE_OTHER): Payer: Medicare Other | Admitting: Ophthalmology

## 2012-02-18 DIAGNOSIS — H35349 Macular cyst, hole, or pseudohole, unspecified eye: Secondary | ICD-10-CM

## 2012-02-18 DIAGNOSIS — H2100 Hyphema, unspecified eye: Secondary | ICD-10-CM

## 2012-02-24 ENCOUNTER — Encounter (INDEPENDENT_AMBULATORY_CARE_PROVIDER_SITE_OTHER): Payer: Medicare Other | Admitting: Ophthalmology

## 2012-02-24 DIAGNOSIS — H35349 Macular cyst, hole, or pseudohole, unspecified eye: Secondary | ICD-10-CM

## 2012-03-09 ENCOUNTER — Ambulatory Visit (INDEPENDENT_AMBULATORY_CARE_PROVIDER_SITE_OTHER): Payer: Medicare Other | Admitting: Ophthalmology

## 2012-03-09 DIAGNOSIS — H35349 Macular cyst, hole, or pseudohole, unspecified eye: Secondary | ICD-10-CM

## 2012-04-27 ENCOUNTER — Encounter (INDEPENDENT_AMBULATORY_CARE_PROVIDER_SITE_OTHER): Payer: Medicare Other | Admitting: Ophthalmology

## 2012-04-27 DIAGNOSIS — H33309 Unspecified retinal break, unspecified eye: Secondary | ICD-10-CM

## 2012-04-27 DIAGNOSIS — H35419 Lattice degeneration of retina, unspecified eye: Secondary | ICD-10-CM

## 2012-04-27 DIAGNOSIS — H35349 Macular cyst, hole, or pseudohole, unspecified eye: Secondary | ICD-10-CM

## 2012-04-27 DIAGNOSIS — H2510 Age-related nuclear cataract, unspecified eye: Secondary | ICD-10-CM

## 2012-04-27 DIAGNOSIS — H43819 Vitreous degeneration, unspecified eye: Secondary | ICD-10-CM

## 2012-05-04 ENCOUNTER — Ambulatory Visit (INDEPENDENT_AMBULATORY_CARE_PROVIDER_SITE_OTHER): Payer: Medicare Other | Admitting: Ophthalmology

## 2012-05-04 DIAGNOSIS — H33309 Unspecified retinal break, unspecified eye: Secondary | ICD-10-CM

## 2012-07-05 HISTORY — PX: CATARACT EXTRACTION W/ INTRAOCULAR LENS  IMPLANT, BILATERAL: SHX1307

## 2012-09-01 ENCOUNTER — Ambulatory Visit (INDEPENDENT_AMBULATORY_CARE_PROVIDER_SITE_OTHER): Payer: Medicare Other | Admitting: Ophthalmology

## 2012-09-01 DIAGNOSIS — H2510 Age-related nuclear cataract, unspecified eye: Secondary | ICD-10-CM

## 2012-09-01 DIAGNOSIS — H35349 Macular cyst, hole, or pseudohole, unspecified eye: Secondary | ICD-10-CM

## 2012-09-01 DIAGNOSIS — H33309 Unspecified retinal break, unspecified eye: Secondary | ICD-10-CM

## 2012-09-01 DIAGNOSIS — H43819 Vitreous degeneration, unspecified eye: Secondary | ICD-10-CM

## 2013-03-01 ENCOUNTER — Encounter (HOSPITAL_COMMUNITY): Payer: Self-pay | Admitting: Pharmacy Technician

## 2013-03-01 ENCOUNTER — Ambulatory Visit (INDEPENDENT_AMBULATORY_CARE_PROVIDER_SITE_OTHER): Payer: Medicare Other | Admitting: Ophthalmology

## 2013-03-01 DIAGNOSIS — H43819 Vitreous degeneration, unspecified eye: Secondary | ICD-10-CM

## 2013-03-01 DIAGNOSIS — H33309 Unspecified retinal break, unspecified eye: Secondary | ICD-10-CM

## 2013-03-01 DIAGNOSIS — H35349 Macular cyst, hole, or pseudohole, unspecified eye: Secondary | ICD-10-CM

## 2013-03-01 NOTE — H&P (Signed)
Robert [name] is an 72 y.o. male.   Chief Complaint:loss of vision after cataract surgery left eye HPI: History of macular hole closure.  Then cataract surgery. Now recurrence of macular hole left eye  Past Medical History  Diagnosis Date  . Asthma     as a child  . GERD (gastroesophageal reflux disease)   . Umbilical hernia   . Transverse myelitis     was treated by Dr Sandria Manly  . Hay fever     Past Surgical History  Procedure Laterality Date  . Pars plana vitrectomy w/ repair of macular hole  01/18/12    left  . Pars plana vitrectomy  01/18/2012    Procedure: PARS PLANA VITRECTOMY WITH 25 GAUGE;  Surgeon: Sherrie George, MD;  Location: [facility] OR;  Service: Ophthalmology;  Laterality: Left;  25g. ppv for macular hole ; Laser treatment;Serum patch and Gas Injection(C3F8)    No family history on file. Social History:  reports that he has been smoking Cigarettes.  He has a 96 pack-year smoking history. He has never used smokeless tobacco. He reports that he does not drink alcohol or use illicit drugs.  Allergies:  Allergies  Allergen Reactions  . Typhoid Vaccines Swelling  . Codeine     Puts me to sleep    No prescriptions prior to admission    Review of systems otherwise negative  There were no vitals taken for this visit.  Physical exam: Mental status: oriented x3. Eyes: See eye exam associated with this date of surgery in media tab.  Scanned in by scanning center Ears, Nose, Throat: within normal limits Neck: Within Normal limits General: within normal limits Chest: Within normal limits Breast: deferred Heart: Within normal limits Abdomen: Within normal limits GU: deferred Extremities: within normal limits Skin: within normal limits  Assessment/Plan Macular hole, recurrent after cataract surgery left eye Plan: To [facility] for Pars plana vitrectomy, membrane peel, serum patch left eye.  Sherrie George 03/01/2013, 12:30 PM

## 2013-03-07 ENCOUNTER — Encounter (HOSPITAL_COMMUNITY): Payer: Self-pay | Admitting: *Deleted

## 2013-03-07 ENCOUNTER — Other Ambulatory Visit (HOSPITAL_COMMUNITY): Payer: Self-pay | Admitting: *Deleted

## 2013-03-07 MED ORDER — DEXTROSE 5 % IV SOLN
2.0000 g | INTRAVENOUS | Status: AC
  Administered 2013-03-08: 2 g via INTRAVENOUS
  Filled 2013-03-07: qty 20

## 2013-03-08 ENCOUNTER — Ambulatory Visit (HOSPITAL_COMMUNITY): Payer: Medicare Other

## 2013-03-08 ENCOUNTER — Encounter (HOSPITAL_COMMUNITY): Payer: Self-pay | Admitting: Certified Registered"

## 2013-03-08 ENCOUNTER — Ambulatory Visit (HOSPITAL_COMMUNITY)
Admission: AD | Admit: 2013-03-08 | Discharge: 2013-03-09 | Disposition: A | Discharge status: Home / Self Care | Payer: Medicare Other | Source: Ambulatory Visit | Attending: Ophthalmology | Admitting: Ophthalmology

## 2013-03-08 ENCOUNTER — Encounter (HOSPITAL_COMMUNITY): Payer: Self-pay | Admitting: *Deleted

## 2013-03-08 ENCOUNTER — Ambulatory Visit (HOSPITAL_COMMUNITY): Payer: Medicare Other | Admitting: Certified Registered"

## 2013-03-08 ENCOUNTER — Encounter (HOSPITAL_COMMUNITY)
Admission: AD | Disposition: A | Discharge status: Home / Self Care | Payer: Self-pay | Source: Ambulatory Visit | Attending: Ophthalmology

## 2013-03-08 DIAGNOSIS — F17200 Nicotine dependence, unspecified, uncomplicated: Secondary | ICD-10-CM | POA: Insufficient documentation

## 2013-03-08 DIAGNOSIS — H35342 Macular cyst, hole, or pseudohole, left eye: Secondary | ICD-10-CM

## 2013-03-08 DIAGNOSIS — H35349 Macular cyst, hole, or pseudohole, unspecified eye: Secondary | ICD-10-CM

## 2013-03-08 DIAGNOSIS — H547 Unspecified visual loss: Secondary | ICD-10-CM | POA: Insufficient documentation

## 2013-03-08 HISTORY — PX: 25 GAUGE PARS PLANA VITRECTOMY WITH 20 GAUGE MVR PORT FOR MACULAR HOLE: SHX6096

## 2013-03-08 HISTORY — PX: GAS INSERTION: SHX5336

## 2013-03-08 HISTORY — DX: Myoneural disorder, unspecified: G70.9

## 2013-03-08 HISTORY — PX: PHOTOCOAGULATION WITH LASER: SHX6027

## 2013-03-08 LAB — CBC
HCT: 41.5 % (ref 39.0–52.0)
Hemoglobin: 15.2 g/dL (ref 13.0–17.0)
MCH: 33.2 pg (ref 26.0–34.0)
MCHC: 36.6 g/dL — ABNORMAL HIGH (ref 30.0–36.0)
MCV: 90.6 fL (ref 78.0–100.0)
Platelets: 153 10*3/uL (ref 150–400)
RBC: 4.58 MIL/uL (ref 4.22–5.81)
RDW: 12.7 % (ref 11.5–15.5)
WBC: 11.3 10*3/uL — ABNORMAL HIGH (ref 4.0–10.5)

## 2013-03-08 LAB — BASIC METABOLIC PANEL
BUN: 17 mg/dL (ref 6–23)
CO2: 23 mEq/L (ref 19–32)
Calcium: 8.9 mg/dL (ref 8.4–10.5)
Chloride: 104 mEq/L (ref 96–112)
Creatinine, Ser: 0.95 mg/dL (ref 0.50–1.35)
GFR calc Af Amer: >90 mL/min (ref >90)
GFR calc non Af Amer: 83 mL/min — ABNORMAL LOW (ref >90)
Glucose, Bld: 107 mg/dL — ABNORMAL HIGH (ref 70–99)
Potassium: 3.9 mEq/L (ref 3.5–5.1)
Sodium: 139 mEq/L (ref 135–145)

## 2013-03-08 LAB — AUTOLOGOUS SERUM PATCH PREP

## 2013-03-08 SURGERY — 25 GAUGE PARS PLANA VITRECTOMY WITH 20 GAUGE MVR PORT FOR MACULAR HOLE
Anesthesia: General | Site: Eye | Laterality: Left | Wound class: Clean

## 2013-03-08 MED ORDER — ONDANSETRON HCL 4 MG/2ML IJ SOLN
INTRAMUSCULAR | Status: DC | PRN
  Administered 2013-03-08: 4 mg via INTRAVENOUS

## 2013-03-08 MED ORDER — SODIUM CHLORIDE 0.9 % IV SOLN
INTRAVENOUS | Status: DC
  Administered 2013-03-08: 10:00:00 via INTRAVENOUS

## 2013-03-08 MED ORDER — PREDNISOLONE ACETATE 1 % OP SUSP
1.0000 [drp] | Freq: Four times a day (QID) | OPHTHALMIC | Status: DC
  Filled 2013-03-08: qty 1

## 2013-03-08 MED ORDER — MIDAZOLAM HCL 5 MG/5ML IJ SOLN
INTRAMUSCULAR | Status: DC | PRN
  Administered 2013-03-08: 2 mg via INTRAVENOUS

## 2013-03-08 MED ORDER — NEOSTIGMINE METHYLSULFATE 1 MG/ML IJ SOLN
INTRAMUSCULAR | Status: DC | PRN
  Administered 2013-03-08: 4 mg via INTRAVENOUS

## 2013-03-08 MED ORDER — FENTANYL CITRATE 0.05 MG/ML IJ SOLN
INTRAMUSCULAR | Status: DC | PRN
  Administered 2013-03-08: 100 ug via INTRAVENOUS

## 2013-03-08 MED ORDER — ACETAZOLAMIDE SODIUM 500 MG IJ SOLR
500.0000 mg | Freq: Once | INTRAMUSCULAR | Status: AC
  Administered 2013-03-09: 500 mg via INTRAVENOUS
  Filled 2013-03-08: qty 500

## 2013-03-08 MED ORDER — HEMOSTATIC AGENTS (NO CHARGE) OPTIME
TOPICAL | Status: DC | PRN
  Administered 2013-03-08: 1

## 2013-03-08 MED ORDER — ROCURONIUM BROMIDE 100 MG/10ML IV SOLN
INTRAVENOUS | Status: DC | PRN
  Administered 2013-03-08: 50 mg via INTRAVENOUS

## 2013-03-08 MED ORDER — SODIUM CHLORIDE 0.9 % IV SOLN
10.0000 mg | INTRAVENOUS | Status: DC | PRN
  Administered 2013-03-08: 5 ug/min via INTRAVENOUS

## 2013-03-08 MED ORDER — SODIUM CHLORIDE 0.9 % IJ SOLN
INTRAMUSCULAR | Status: DC | PRN
  Administered 2013-03-08: 15:00:00

## 2013-03-08 MED ORDER — HYDROCODONE-ACETAMINOPHEN 5-325 MG PO TABS: 1.0000 | ORAL_TABLET | ORAL | Status: DC | PRN

## 2013-03-08 MED ORDER — BRIMONIDINE TARTRATE 0.2 % OP SOLN
1.0000 [drp] | Freq: Two times a day (BID) | OPHTHALMIC | Status: DC
  Filled 2013-03-08: qty 5

## 2013-03-08 MED ORDER — SODIUM HYALURONATE 10 MG/ML IO SOLN
INTRAOCULAR | Status: DC | PRN
  Administered 2013-03-08: 0.85 mL via INTRAOCULAR

## 2013-03-08 MED ORDER — TRIAMCINOLONE ACETONIDE 40 MG/ML IJ SUSP
INTRAMUSCULAR | Status: DC | PRN
  Administered 2013-03-08: 40 mg

## 2013-03-08 MED ORDER — BACITRACIN-POLYMYXIN B 500-10000 UNIT/GM OP OINT
TOPICAL_OINTMENT | OPHTHALMIC | Status: DC | PRN
  Administered 2013-03-08: 1 via OPHTHALMIC

## 2013-03-08 MED ORDER — GATIFLOXACIN 0.5 % OP SOLN
1.0000 [drp] | OPHTHALMIC | Status: AC
  Administered 2013-03-08 (×3): 1 [drp] via OPHTHALMIC
  Filled 2013-03-08: qty 2.5

## 2013-03-08 MED ORDER — HYDROMORPHONE HCL PF 1 MG/ML IJ SOLN: 0.2500 mg | INTRAMUSCULAR | Status: DC | PRN

## 2013-03-08 MED ORDER — DOCUSATE SODIUM 100 MG PO CAPS
100.0000 mg | ORAL_CAPSULE | Freq: Two times a day (BID) | ORAL | Status: DC
Start: 2013-03-08 — End: 2013-03-09
  Administered 2013-03-08: 100 mg via ORAL
  Filled 2013-03-08: qty 1

## 2013-03-08 MED ORDER — SODIUM CHLORIDE 0.45 % IV SOLN
INTRAVENOUS | Status: DC
  Administered 2013-03-08: 17:00:00 via INTRAVENOUS

## 2013-03-08 MED ORDER — EPINEPHRINE HCL 1 MG/ML IJ SOLN
INTRAOCULAR | Status: DC | PRN
  Administered 2013-03-08: 15:00:00

## 2013-03-08 MED ORDER — BACITRACIN-POLYMYXIN B 500-10000 UNIT/GM OP OINT
1.0000 "application " | TOPICAL_OINTMENT | Freq: Four times a day (QID) | OPHTHALMIC | Status: DC
  Filled 2013-03-08: qty 3.5

## 2013-03-08 MED ORDER — OXYCODONE HCL 5 MG/5ML PO SOLN: 5.0000 mg | Freq: Once | ORAL | Status: DC | PRN

## 2013-03-08 MED ORDER — CYCLOPENTOLATE HCL 1 % OP SOLN
1.0000 [drp] | OPHTHALMIC | Status: AC
  Administered 2013-03-08 (×3): 1 [drp] via OPHTHALMIC
  Filled 2013-03-08: qty 2

## 2013-03-08 MED ORDER — BSS IO SOLN
INTRAOCULAR | Status: DC | PRN
  Administered 2013-03-08: 15 mL via INTRAOCULAR

## 2013-03-08 MED ORDER — MAGNESIUM HYDROXIDE 400 MG/5ML PO SUSP: 15.0000 mL | Freq: Four times a day (QID) | ORAL | Status: DC | PRN

## 2013-03-08 MED ORDER — LACTATED RINGERS IV SOLN
INTRAVENOUS | Status: DC | PRN
  Administered 2013-03-08 (×2): via INTRAVENOUS

## 2013-03-08 MED ORDER — PHENYLEPHRINE HCL 2.5 % OP SOLN
1.0000 [drp] | OPHTHALMIC | Status: AC | PRN
  Administered 2013-03-08 (×3): 1 [drp] via OPHTHALMIC
  Filled 2013-03-08: qty 2

## 2013-03-08 MED ORDER — ACETAMINOPHEN 325 MG PO TABS: 325.0000 mg | ORAL_TABLET | ORAL | Status: DC | PRN

## 2013-03-08 MED ORDER — GATIFLOXACIN 0.5 % OP SOLN
1.0000 [drp] | Freq: Four times a day (QID) | OPHTHALMIC | Status: DC
  Filled 2013-03-08: qty 2.5

## 2013-03-08 MED ORDER — GLYCOPYRROLATE 0.2 MG/ML IJ SOLN
INTRAMUSCULAR | Status: DC | PRN
  Administered 2013-03-08: .8 mg via INTRAVENOUS

## 2013-03-08 MED ORDER — ONDANSETRON HCL 4 MG/2ML IJ SOLN: 4.0000 mg | Freq: Four times a day (QID) | INTRAMUSCULAR | Status: DC | PRN

## 2013-03-08 MED ORDER — TETRACAINE HCL 0.5 % OP SOLN
2.0000 [drp] | Freq: Once | OPHTHALMIC | Status: DC
  Filled 2013-03-08: qty 2

## 2013-03-08 MED ORDER — METOCLOPRAMIDE HCL 5 MG/ML IJ SOLN: 10.0000 mg | Freq: Once | INTRAMUSCULAR | Status: DC | PRN

## 2013-03-08 MED ORDER — BUPIVACAINE HCL (PF) 0.75 % IJ SOLN
INTRAMUSCULAR | Status: DC | PRN
  Administered 2013-03-08: 10 mL

## 2013-03-08 MED ORDER — TEMAZEPAM 15 MG PO CAPS: 15.0000 mg | ORAL_CAPSULE | Freq: Every evening | ORAL | Status: DC | PRN

## 2013-03-08 MED ORDER — OXYCODONE HCL 5 MG PO TABS: 5.0000 mg | ORAL_TABLET | Freq: Once | ORAL | Status: DC | PRN

## 2013-03-08 MED ORDER — TROPICAMIDE 1 % OP SOLN
1.0000 [drp] | OPHTHALMIC | Status: AC
  Administered 2013-03-08 (×3): 1 [drp] via OPHTHALMIC
  Filled 2013-03-08: qty 3

## 2013-03-08 MED ORDER — DEXAMETHASONE SODIUM PHOSPHATE 10 MG/ML IJ SOLN
INTRAMUSCULAR | Status: DC | PRN
  Administered 2013-03-08: 10 mg

## 2013-03-08 MED ORDER — HYPROMELLOSE (GONIOSCOPIC) 2.5 % OP SOLN
OPHTHALMIC | Status: DC | PRN
  Administered 2013-03-08: 2 [drp] via OPHTHALMIC

## 2013-03-08 MED ORDER — MORPHINE SULFATE 2 MG/ML IJ SOLN: 1.0000 mg | INTRAMUSCULAR | Status: DC | PRN

## 2013-03-08 MED ORDER — LIDOCAINE HCL (CARDIAC) 20 MG/ML IV SOLN
INTRAVENOUS | Status: DC | PRN
  Administered 2013-03-08: 40 mg via INTRAVENOUS

## 2013-03-08 MED ORDER — PROPOFOL 10 MG/ML IV BOLUS
INTRAVENOUS | Status: DC | PRN
  Administered 2013-03-08: 130 mg via INTRAVENOUS

## 2013-03-08 MED ORDER — LATANOPROST 0.005 % OP SOLN
1.0000 [drp] | Freq: Every day | OPHTHALMIC | Status: DC
  Filled 2013-03-08: qty 2.5

## 2013-03-08 SURGICAL SUPPLY — 71 items
APPLICATOR DR MATTHEWS STRL (MISCELLANEOUS) IMPLANT
BLADE EYE CATARACT 19 1.4 BEAV (BLADE) IMPLANT
BLADE MVR KNIFE 19G (BLADE) IMPLANT
BLADE MVR KNIFE 20G (BLADE) ×2 IMPLANT
CANNULA ANT CHAM MAIN (OPHTHALMIC RELATED) IMPLANT
CANNULA FLEX TIP 25G (CANNULA) ×2 IMPLANT
CANNULA VLV SOFT TIP 25GA (OPHTHALMIC) ×2 IMPLANT
CLOTH BEACON ORANGE TIMEOUT ST (SAFETY) ×2 IMPLANT
CORDS BIPOLAR (ELECTRODE) ×2 IMPLANT
COTTONBALL LRG STERILE PKG (GAUZE/BANDAGES/DRESSINGS) ×6 IMPLANT
COVER MAYO STAND STRL (DRAPES) ×2 IMPLANT
DRAPE INCISE 51X51 W/FILM STRL (DRAPES) IMPLANT
DRAPE OPHTHALMIC 77X100 STRL (CUSTOM PROCEDURE TRAY) ×2 IMPLANT
EAGLE VIT/RET MICRO PIC 168 25 (MISCELLANEOUS) ×2 IMPLANT
ERASER HMR WETFIELD 23G BP (MISCELLANEOUS) IMPLANT
FILTER BLUE MILLIPORE (MISCELLANEOUS) IMPLANT
FILTER STRAW FLUID ASPIR (MISCELLANEOUS) ×2 IMPLANT
GAS AUTO FILL CONSTEL (OPHTHALMIC)
GAS AUTO FILL CONSTELLATION (OPHTHALMIC) IMPLANT
GAS OPHTHALMIC (MISCELLANEOUS) ×2 IMPLANT
GLOVE SS BIOGEL STRL SZ 6.5 (GLOVE) ×1 IMPLANT
GLOVE SS BIOGEL STRL SZ 7 (GLOVE) ×1 IMPLANT
GLOVE SUPERSENSE BIOGEL SZ 6.5 (GLOVE) ×1
GLOVE SUPERSENSE BIOGEL SZ 7 (GLOVE) ×1
GLOVE SURG 8.5 LATEX PF (GLOVE) ×2 IMPLANT
GLOVE SURG SS PI 6.0 STRL IVOR (GLOVE) ×2 IMPLANT
GOWN STRL NON-REIN LRG LVL3 (GOWN DISPOSABLE) ×8 IMPLANT
HANDLE PNEUMATIC FOR CONSTEL (OPHTHALMIC) IMPLANT
KIT BASIN OR (CUSTOM PROCEDURE TRAY) ×2 IMPLANT
KIT ROOM TURNOVER OR (KITS) ×2 IMPLANT
KNIFE CRESCENT 1.75 EDGEAHEAD (BLADE) IMPLANT
KNIFE GRIESHABER SHARP 2.5MM (MISCELLANEOUS) ×2 IMPLANT
MASK EYE SHIELD (GAUZE/BANDAGES/DRESSINGS) ×2 IMPLANT
MICROPICK 25G (MISCELLANEOUS)
NEEDLE 18GX1X1/2 (RX/OR ONLY) (NEEDLE) ×2 IMPLANT
NEEDLE 25GX 5/8IN NON SAFETY (NEEDLE) ×2 IMPLANT
NEEDLE 27GAX1X1/2 (NEEDLE) IMPLANT
NEEDLE FILTER BLUNT 18X 1/2SAF (NEEDLE) ×1
NEEDLE FILTER BLUNT 18X1 1/2 (NEEDLE) ×1 IMPLANT
NEEDLE HYPO 30X.5 LL (NEEDLE) ×6 IMPLANT
NS IRRIG 1000ML POUR BTL (IV SOLUTION) ×2 IMPLANT
PACK FRAGMATOME (OPHTHALMIC) IMPLANT
PACK VITRECTOMY CUSTOM (CUSTOM PROCEDURE TRAY) ×2 IMPLANT
PAD ARMBOARD 7.5X6 YLW CONV (MISCELLANEOUS) ×4 IMPLANT
PAD EYE OVAL STERILE LF (GAUZE/BANDAGES/DRESSINGS) ×2 IMPLANT
PAK PIK VITRECTOMY CVS 25GA (OPHTHALMIC) ×2 IMPLANT
PIC ILLUMINATED 25G (OPHTHALMIC) ×2
PICK MICROPICK 25G (MISCELLANEOUS) IMPLANT
PIK ILLUMINATED 25G (OPHTHALMIC) ×1 IMPLANT
PROBE LASER ILLUM FLEX CVD 25G (OPHTHALMIC) IMPLANT
REPL STRA BRUSH NEEDLE (NEEDLE) ×2 IMPLANT
RESERVOIR BACK FLUSH (MISCELLANEOUS) ×2 IMPLANT
ROLLS DENTAL (MISCELLANEOUS) ×4 IMPLANT
SCRAPER DIAMOND 25GA (OPHTHALMIC RELATED) ×2 IMPLANT
SCRAPER DIAMOND DUST MEMBRANE (MISCELLANEOUS) ×2 IMPLANT
SPONGE SURGIFOAM ABS GEL 12-7 (HEMOSTASIS) ×2 IMPLANT
STOPCOCK 4 WAY LG BORE MALE ST (IV SETS) ×2 IMPLANT
SUT CHROMIC 7 0 TG140 8 (SUTURE) IMPLANT
SUT ETHILON 9 0 TG140 8 (SUTURE) ×2 IMPLANT
SUT POLY NON ABSORB 10-0 8 STR (SUTURE) IMPLANT
SUT SILK 4 0 RB 1 (SUTURE) IMPLANT
SYR 20CC LL (SYRINGE) ×2 IMPLANT
SYR 5ML LL (SYRINGE) IMPLANT
SYR BULB 3OZ (MISCELLANEOUS) ×2 IMPLANT
SYR TB 1ML LUER SLIP (SYRINGE) ×2 IMPLANT
SYRINGE 10CC LL (SYRINGE) ×4 IMPLANT
TAPE SURG TRANSPORE 1 IN (GAUZE/BANDAGES/DRESSINGS) ×1 IMPLANT
TAPE SURGICAL TRANSPORE 1 IN (GAUZE/BANDAGES/DRESSINGS) ×1
TOWEL OR 17X24 6PK STRL BLUE (TOWEL DISPOSABLE) ×6 IMPLANT
WATER STERILE IRR 1000ML POUR (IV SOLUTION) ×2 IMPLANT
WIPE INSTRUMENT VISIWIPE 73X73 (MISCELLANEOUS) ×2 IMPLANT

## 2013-03-08 NOTE — Brief Op Note (Signed)
03/08/2013  3:05 PM  PATIENT:  Robert [name]  72 y.o. male  PRE-OPERATIVE DIAGNOSIS:  Recurrent Macular Hole Left Eye  POST-OPERATIVE DIAGNOSIS:  Recurrent Macular Hole Left Eye  PROCEDURE:  Procedure(s) with comments: 25 GAUGE PARS PLANA VITRECTOMY WITH 20 GAUGE MVR PORT FOR MACULAR HOLE (Left) INSERTION OF GAS (Left) - C3F8 PHOTOCOAGULATION WITH LASER (Left) - Headscope   SURGEON:  Surgeon(s) and Role:    * Sherrie George, MD - Primary  Brief Operative note   Preoperative diagnosis:  Pre-Op Diagnosis Codes:    * Macular cyst, hole, or pseudohole of retina [362.54] Postoperative diagnosis  Post-Op Diagnosis Codes:    * Macular cyst, hole, or pseudohole of retina [362.54]  Procedures:  Pars plana vitrectomy, laser, serum patch, gas injection  Left eye  Surgeon:  Sherrie George, MD...  Assistant:  Rosalie Doctor SA    Anesthesia: General  Specimen: none  Estimated blood loss:  1cc  Complications: none  Patient sent to PACU in good condition  Composed by Sherrie George MD  Dictation number: [id number]

## 2013-03-08 NOTE — Anesthesia Preprocedure Evaluation (Addendum)
Anesthesia Evaluation  Patient identified by MRN, date of birth, ID band Patient awake    Reviewed: Allergy & Precautions, H&P , NPO status , Patient's Chart, lab work & pertinent test results  Airway Mallampati: II      Dental  (+) Teeth Intact and Dental Advisory Given   Pulmonary  breath sounds clear to auscultation        Cardiovascular Rhythm:Regular Rate:Normal     Neuro/Psych    GI/Hepatic   Endo/Other    Renal/GU      Musculoskeletal   Abdominal (+) + obese,   Peds  Hematology   Anesthesia Other Findings   Reproductive/Obstetrics                           Anesthesia Physical Anesthesia Plan  ASA: II  Anesthesia Plan: General   Post-op Pain Management:    Induction: Intravenous  Airway Management Planned: Oral ETT  Additional Equipment:   Intra-op Plan:   Post-operative Plan: Extubation in OR  Informed Consent: I have reviewed the patients History and Physical, chart, labs and discussed the procedure including the risks, benefits and alternatives for the proposed anesthesia with the patient or authorized representative who has indicated his/her understanding and acceptance.   Dental advisory given  Plan Discussed with: CRNA and Anesthesiologist  Anesthesia Plan Comments: (L. Macular Hole Smoker/COPD GERD  Plan GA with oral ETT  Kipp Brood, MD)        Anesthesia Quick Evaluation

## 2013-03-08 NOTE — H&P (Signed)
I examined the patient today and there is no change in the medical status 

## 2013-03-08 NOTE — Anesthesia Procedure Notes (Signed)
Procedure Name: Intubation Date/Time: 03/08/2013 1:51 PM Performed by: Armandina Gemma Pre-anesthesia Checklist: Patient identified, Timeout performed, Emergency Drugs available, Suction available and Patient being monitored Patient Re-evaluated:Patient Re-evaluated prior to inductionOxygen Delivery Method: Circle system utilized Preoxygenation: Pre-oxygenation with 100% oxygen Intubation Type: IV induction Ventilation: Mask ventilation without difficulty and Oral airway inserted - appropriate to patient size Laryngoscope Size: Miller and 2 Tube type: Oral Tube size: 7.5 mm Number of attempts: 1 Airway Equipment and Method: LTA kit utilized and Stylet Placement Confirmation: positive ETCO2,  breath sounds checked- equal and bilateral and ETT inserted through vocal cords under direct vision Secured at: 22 cm Tube secured with: Tape Dental Injury: Teeth and Oropharynx as per pre-operative assessment  Comments: IV induction Fitzgerald- intubation AM CRNA- atraumatic teeth and mouth as preop

## 2013-03-08 NOTE — OR Nursing (Signed)
Relocated to recliner and placed in face down posn

## 2013-03-08 NOTE — Preoperative (Signed)
Beta Blockers   Reason not to administer Beta Blockers:Not Applicable 

## 2013-03-08 NOTE — Anesthesia Postprocedure Evaluation (Signed)
  Anesthesia Post-op Note  Patient: Robert [name]  Procedure(s) Performed: Procedure(s) with comments: 25 GAUGE PARS PLANA VITRECTOMY WITH 20 GAUGE MVR PORT FOR MACULAR HOLE (Left) INSERTION OF GAS (Left) - C3F8 PHOTOCOAGULATION WITH LASER (Left) - Headscope   Patient Location: PACU  Anesthesia Type:General  Level of Consciousness: awake, alert  and oriented  Airway and Oxygen Therapy: Patient Spontanous Breathing and Patient connected to nasal cannula oxygen  Post-op Pain: mild  Post-op Assessment: Post-op Vital signs reviewed, Patient's Cardiovascular Status Stable, Respiratory Function Stable, Patent Airway and Pain level controlled  Post-op Vital Signs: stable  Complications: No apparent anesthesia complications

## 2013-03-08 NOTE — Transfer of Care (Signed)
Immediate Anesthesia Transfer of Care Note  Patient: Robert [name]  Procedure(s) Performed: Procedure(s) with comments: 25 GAUGE PARS PLANA VITRECTOMY WITH 20 GAUGE MVR PORT FOR MACULAR HOLE (Left) INSERTION OF GAS (Left) - C3F8 PHOTOCOAGULATION WITH LASER (Left) - Headscope   Patient Location: PACU  Anesthesia Type:General  Level of Consciousness: awake  Airway & Oxygen Therapy: Patient Spontanous Breathing and Patient connected to nasal cannula oxygen  Post-op Assessment: Report given to PACU RN and Post -op Vital signs reviewed and stable  Post vital signs: Reviewed and stable  Complications: No apparent anesthesia complications

## 2013-03-09 ENCOUNTER — Encounter (HOSPITAL_COMMUNITY): Payer: Self-pay | Admitting: Ophthalmology

## 2013-03-09 MED ORDER — PREDNISOLONE ACETATE 1 % OP SUSP: 1.0000 [drp] | Freq: Four times a day (QID) | OPHTHALMIC | Status: DC

## 2013-03-09 MED ORDER — BRIMONIDINE TARTRATE 0.2 % OP SOLN: 1.0000 [drp] | Freq: Two times a day (BID) | OPHTHALMIC | Status: DC

## 2013-03-09 MED ORDER — GATIFLOXACIN 0.5 % OP SOLN: 1.0000 [drp] | Freq: Four times a day (QID) | OPHTHALMIC | Status: DC

## 2013-03-09 MED ORDER — BACITRACIN-POLYMYXIN B 500-10000 UNIT/GM OP OINT: 1.0000 "application " | TOPICAL_OINTMENT | Freq: Four times a day (QID) | OPHTHALMIC | Status: DC

## 2013-03-09 MED ORDER — LATANOPROST 0.005 % OP SOLN: 1.0000 [drp] | Freq: Every day | OPHTHALMIC | Status: DC

## 2013-03-09 NOTE — Discharge Summary (Signed)
Discharge summary not needed on [facility] patients per medical records. 

## 2013-03-09 NOTE — Op Note (Signed)
NAME:  Robert [name], Robert [name] NO.:  [id number]  MEDICAL RECORD NO.:  [id number]  LOCATION:  6N17C                        FACILITY:  [facility]  PHYSICIAN:  Beulah Gandy. Ashley Royalty, M.D. DATE OF BIRTH:  1941-01-21  DATE OF PROCEDURE:  03/08/2013 DATE OF DISCHARGE:                              OPERATIVE REPORT   ADMISSION DIAGNOSIS:  Recurrent macular hole, left eye.  PROCEDURES:  Pars plana vitrectomy, membrane peel, retinal photocoagulation, iridectomy, gas-fluid exchange, serum patch, all in the left eye.  SURGEON:  Beulah Gandy. Ashley Royalty, M.D.  ASSISTANT:  Rosalie Doctor, SA.  ANESTHESIA:  General.  DETAILS:  Usual prep and drape, the indirect ophthalmoscope laser was moved into place, 845 burns placed around the retinal periphery and weak areas of the retina.  The power was 460 mW, 1000 microns each and 0.1 seconds each.  The pars plana vitrectomy was begun just behind the pupillary axis and some membranes and vitreous strands were encountered. These were carefully removed under low suction and rapid cutting down to the macular surface.  A core vitrectomy was finished.  The diamond- dusted membrane scraper was used to attack the surface of the retina and the internal limiting membrane.  The membrane was peeled from around the edges of the macular hole toward the center of the hole.  The edges of the membrane lifted up and were removed with the vitreous cutter. Kenalog was injected over the posterior pole to mark the internal limiting membrane.  This allowed additional dissection of the internal limiting membrane with the diamond-dusted membrane scraper.  All of the adherent to Kenalog was removed from the retinal surface along with the internal limiting membrane.  The edges of the hole was massaged with the diamond-dusted membrane scraper.  Once this was accomplished, the vitrectomy was carried into the midperiphery where additional membranes were removed and into the far  periphery down to the vitreous base where it was trimmed.  A total gas-fluid exchange was carried out at this point.  Sufficient time was allowed to pass for fluid to track down the walls of the eye and collect in the posterior segment.  The serum patch and the C3F8 17% concentration was prepared.  Additional fluid was removed with a [employer] brush.  The serum patch was delivered. Additional fluid and excess serum patch were removed with a [employer] brush.  C3F8 17% was exchanged for intravitreal gas. Peripheral iridectomy at 12 o'clock was made rather large to accommodate free flow of fluid and gas into the anterior chamber because there was an anterior chamber intraocular lens in place.  The instruments were removed from the eye.  A sclerotomy site at 2 o'clock was closed with 9- 0 nylon.  25-gauge trocars were removed and the wounds were tested, they were found to be secure.  Wet-field cautery was used for conjunctival closure at 2 o'clock.  Polymyxin and gentamicin were irrigated into Tenon's space.  Atropine solution was applied.  Marcaine was injected around the globe for postop pain.  Decadron 10 mg was injected to the lower subconjunctival space.  Closing pressure was 10 with a Barraquer tonometer.  COMPLICATIONS:  None.  DURATION:  1 hour.  Polysporin ophthalmic ointment, and patch  and shield were placed.  The patient was awakened and taken to recovery in satisfactory condition.     Beulah Gandy. Ashley Royalty, M.D.     JDM/MEDQ  D:  03/08/2013  T:  03/09/2013  Job:  [id number]

## 2013-03-09 NOTE — Progress Notes (Signed)
03/09/2013, 6:39 AM  Mental Status:  Awake, Alert, Oriented  Anterior segment: Cornea  Clear    Anterior Chamber Clear    Lens:   IOL  Intra Ocular Pressure 25 mmHg with Tonopen  Vitreous: Clear 90%gas bubble   Retina:  Attached Good laser reaction   Impression: Excellent result Retina attached   Final Diagnosis: Active Problems: Macular hole left eye   Plan: start post operative eye drops.  Discharge to home.  Give post operative instructions  Robert [name] 03/09/2013, 6:39 AM

## 2013-03-15 ENCOUNTER — Ambulatory Visit (INDEPENDENT_AMBULATORY_CARE_PROVIDER_SITE_OTHER): Payer: Medicare Other | Admitting: Ophthalmology

## 2013-03-15 DIAGNOSIS — H35349 Macular cyst, hole, or pseudohole, unspecified eye: Secondary | ICD-10-CM

## 2013-04-06 ENCOUNTER — Encounter (INDEPENDENT_AMBULATORY_CARE_PROVIDER_SITE_OTHER): Payer: Medicare Other | Admitting: Ophthalmology

## 2013-04-06 DIAGNOSIS — H35349 Macular cyst, hole, or pseudohole, unspecified eye: Secondary | ICD-10-CM

## 2013-06-15 ENCOUNTER — Encounter (INDEPENDENT_AMBULATORY_CARE_PROVIDER_SITE_OTHER): Payer: Medicare Other | Admitting: Ophthalmology

## 2013-06-15 DIAGNOSIS — H43819 Vitreous degeneration, unspecified eye: Secondary | ICD-10-CM

## 2013-06-15 DIAGNOSIS — H33309 Unspecified retinal break, unspecified eye: Secondary | ICD-10-CM

## 2013-06-15 DIAGNOSIS — H35349 Macular cyst, hole, or pseudohole, unspecified eye: Secondary | ICD-10-CM

## 2013-10-15 ENCOUNTER — Ambulatory Visit (INDEPENDENT_AMBULATORY_CARE_PROVIDER_SITE_OTHER): Payer: Medicare Other | Admitting: Ophthalmology

## 2013-10-15 DIAGNOSIS — H33309 Unspecified retinal break, unspecified eye: Secondary | ICD-10-CM

## 2013-10-15 DIAGNOSIS — H43819 Vitreous degeneration, unspecified eye: Secondary | ICD-10-CM

## 2013-10-15 DIAGNOSIS — H35349 Macular cyst, hole, or pseudohole, unspecified eye: Secondary | ICD-10-CM

## 2014-02-14 ENCOUNTER — Ambulatory Visit (INDEPENDENT_AMBULATORY_CARE_PROVIDER_SITE_OTHER): Payer: Medicare Other | Admitting: Ophthalmology

## 2014-02-14 DIAGNOSIS — H33309 Unspecified retinal break, unspecified eye: Secondary | ICD-10-CM

## 2014-02-14 DIAGNOSIS — H43819 Vitreous degeneration, unspecified eye: Secondary | ICD-10-CM

## 2014-02-14 DIAGNOSIS — H35349 Macular cyst, hole, or pseudohole, unspecified eye: Secondary | ICD-10-CM

## 2014-03-05 DIAGNOSIS — H35341 Macular cyst, hole, or pseudohole, right eye: Secondary | ICD-10-CM

## 2014-03-05 HISTORY — DX: Macular cyst, hole, or pseudohole, right eye: H35.341

## 2014-03-05 NOTE — H&P (Signed)
ZAYLON BOSSIER is an 73 y.o. male.   Chief Complaint:Loss of vision over 4 months  Right eye HPI: History of macular hole left eye, repaired, now has macular hole right eye  Past Medical History  Diagnosis Date  . Asthma     as a child  . Umbilical hernia   . Transverse myelitis     was treated by Dr Erling Cruz  . Hay fever   . Neuromuscular disorder     numbness in feet    Past Surgical History  Procedure Laterality Date  . Pars plana vitrectomy  01/18/2012    Procedure: PARS PLANA VITRECTOMY WITH 25 GAUGE;  Surgeon: Hayden Pedro, MD;  Location: [place];  Service: Ophthalmology;  Laterality: Left;  25g. ppv for macular hole ; Laser treatment;Serum patch and Gas Injection(C3F8)  . Pars plana vitrectomy w/ repair of macular hole Left 03/08/2013  . Eye surgery    . Cataract extraction w/ intraocular lens  implant, bilateral Bilateral 2014  . 25 gauge pars plana vitrectomy with 20 gauge mvr port for macular hole Left 03/08/2013    Procedure: 21 GAUGE PARS PLANA VITRECTOMY WITH 20 GAUGE MVR PORT FOR MACULAR HOLE;  Surgeon: Hayden Pedro, MD;  Location: [place];  Service: Ophthalmology;  Laterality: Left;  . Gas insertion Left 03/08/2013    Procedure: INSERTION OF GAS;  Surgeon: Hayden Pedro, MD;  Location: [place];  Service: Ophthalmology;  Laterality: Left;  C3F8  . Photocoagulation with laser Left 03/08/2013    Procedure: PHOTOCOAGULATION WITH LASER;  Surgeon: Hayden Pedro, MD;  Location: [place];  Service: Ophthalmology;  Laterality: Left;  Headscope     No family history on file. Social History:  reports that he has been smoking Cigarettes.  He has a 100 pack-year smoking history. He has never used smokeless tobacco. He reports that he does not drink alcohol or use illicit drugs.  Allergies:  Allergies  Allergen Reactions  . Typhoid Vaccines Swelling  . Codeine     Puts me to sleep  . Ivp Dye [Iodinated Diagnostic Agents] Itching    Face got splotchy.      No prescriptions  prior to admission    Review of systems otherwise negative  There were no vitals taken for this visit.  Physical exam: Mental status: oriented x3. Eyes: See eye exam associated with this date of surgery in media tab.  Scanned in by scanning center Ears, Nose, Throat: within normal limits Neck: Within Normal limits General: within normal limits Chest: Within normal limits Breast: deferred Heart: Within normal limits Abdomen: Within normal limits GU: deferred Extremities: within normal limits Skin: within normal limits  Assessment/Plan Macular hole right eye Plan: To [facility] for Pars plana vitrectomy, membrane peel, serum patch, gas injection, laser right eye  Hayden Pedro 03/05/2014, 7:34 AM

## 2014-04-01 ENCOUNTER — Encounter (HOSPITAL_COMMUNITY): Payer: Self-pay | Admitting: *Deleted

## 2014-04-01 ENCOUNTER — Encounter (HOSPITAL_COMMUNITY): Payer: Self-pay | Admitting: Pharmacy Technician

## 2014-04-01 MED ORDER — CEFAZOLIN SODIUM-DEXTROSE 2-3 GM-% IV SOLR
2.0000 g | INTRAVENOUS | Status: AC
  Administered 2014-04-02: 2 g via INTRAVENOUS
  Filled 2014-04-01: qty 50

## 2014-04-01 MED ORDER — CYCLOPENTOLATE HCL 1 % OP SOLN
1.0000 [drp] | OPHTHALMIC | Status: AC | PRN
  Administered 2014-04-02 (×3): 1 [drp] via OPHTHALMIC
  Filled 2014-04-01: qty 2

## 2014-04-01 MED ORDER — GATIFLOXACIN 0.5 % OP SOLN
1.0000 [drp] | OPHTHALMIC | Status: AC | PRN
  Administered 2014-04-02 (×3): 1 [drp] via OPHTHALMIC
  Filled 2014-04-01: qty 2.5

## 2014-04-01 MED ORDER — TROPICAMIDE 1 % OP SOLN
1.0000 [drp] | OPHTHALMIC | Status: AC | PRN
Start: 2014-04-01 — End: 2014-04-02
  Administered 2014-04-02 (×3): 1 [drp] via OPHTHALMIC
  Filled 2014-04-01: qty 3

## 2014-04-01 MED ORDER — PHENYLEPHRINE HCL 2.5 % OP SOLN
1.0000 [drp] | OPHTHALMIC | Status: AC | PRN
Start: 2014-04-01 — End: 2014-04-02
  Administered 2014-04-02 (×3): 1 [drp] via OPHTHALMIC
  Filled 2014-04-01: qty 2

## 2014-04-02 ENCOUNTER — Encounter (HOSPITAL_COMMUNITY): Payer: Medicare Other | Admitting: Anesthesiology

## 2014-04-02 ENCOUNTER — Ambulatory Visit (HOSPITAL_COMMUNITY): Payer: Medicare Other

## 2014-04-02 ENCOUNTER — Ambulatory Visit (HOSPITAL_COMMUNITY): Payer: Medicare Other | Admitting: Anesthesiology

## 2014-04-02 ENCOUNTER — Encounter (INDEPENDENT_AMBULATORY_CARE_PROVIDER_SITE_OTHER): Payer: Medicare Other | Admitting: Ophthalmology

## 2014-04-02 ENCOUNTER — Encounter (HOSPITAL_COMMUNITY)
Admission: RE | Disposition: A | Discharge status: Home / Self Care | Payer: Self-pay | Source: Ambulatory Visit | Attending: Ophthalmology

## 2014-04-02 ENCOUNTER — Encounter (HOSPITAL_COMMUNITY): Payer: Self-pay | Admitting: *Deleted

## 2014-04-02 ENCOUNTER — Ambulatory Visit (HOSPITAL_COMMUNITY)
Admission: RE | Admit: 2014-04-02 | Discharge: 2014-04-03 | Disposition: A | Discharge status: Home / Self Care | Payer: Medicare Other | Source: Ambulatory Visit | Attending: Ophthalmology | Admitting: Ophthalmology

## 2014-04-02 DIAGNOSIS — H43819 Vitreous degeneration, unspecified eye: Secondary | ICD-10-CM

## 2014-04-02 DIAGNOSIS — F17200 Nicotine dependence, unspecified, uncomplicated: Secondary | ICD-10-CM | POA: Diagnosis not present

## 2014-04-02 DIAGNOSIS — J45909 Unspecified asthma, uncomplicated: Secondary | ICD-10-CM | POA: Insufficient documentation

## 2014-04-02 DIAGNOSIS — H35341 Macular cyst, hole, or pseudohole, right eye: Secondary | ICD-10-CM | POA: Diagnosis present

## 2014-04-02 DIAGNOSIS — H35349 Macular cyst, hole, or pseudohole, unspecified eye: Secondary | ICD-10-CM | POA: Diagnosis not present

## 2014-04-02 DIAGNOSIS — H35419 Lattice degeneration of retina, unspecified eye: Secondary | ICD-10-CM | POA: Diagnosis not present

## 2014-04-02 HISTORY — PX: PARS PLANA VITRECTOMY W/ REPAIR OF MACULAR HOLE: SHX2170

## 2014-04-02 HISTORY — PX: 25 GAUGE PARS PLANA VITRECTOMY WITH 20 GAUGE MVR PORT FOR MACULAR HOLE: SHX6096

## 2014-04-02 HISTORY — DX: Headache, unspecified: R51.9

## 2014-04-02 HISTORY — DX: Other specified postprocedural states: R11.2

## 2014-04-02 HISTORY — DX: Unspecified asthma, uncomplicated: J45.909

## 2014-04-02 HISTORY — DX: Headache: R51

## 2014-04-02 HISTORY — DX: Other specified postprocedural states: Z98.890

## 2014-04-02 LAB — CBC
HCT: 43.8 % (ref 39.0–52.0)
Hemoglobin: 15.4 g/dL (ref 13.0–17.0)
MCH: 31.6 pg (ref 26.0–34.0)
MCHC: 35.2 g/dL (ref 30.0–36.0)
MCV: 89.9 fL (ref 78.0–100.0)
Platelets: 179 10*3/uL (ref 150–400)
RBC: 4.87 MIL/uL (ref 4.22–5.81)
RDW: 12.7 % (ref 11.5–15.5)
WBC: 11.6 10*3/uL — ABNORMAL HIGH (ref 4.0–10.5)

## 2014-04-02 LAB — AUTOLOGOUS SERUM PATCH PREP

## 2014-04-02 SURGERY — 25 GAUGE PARS PLANA VITRECTOMY WITH 20 GAUGE MVR PORT FOR MACULAR HOLE
Anesthesia: General | Site: Eye | Laterality: Right

## 2014-04-02 MED ORDER — ROCURONIUM BROMIDE 50 MG/5ML IV SOLN
INTRAVENOUS | Status: AC
  Filled 2014-04-02: qty 1

## 2014-04-02 MED ORDER — SODIUM HYALURONATE 10 MG/ML IO SOLN
INTRAOCULAR | Status: AC
  Filled 2014-04-02: qty 0.85

## 2014-04-02 MED ORDER — PHENYLEPHRINE 40 MCG/ML (10ML) SYRINGE FOR IV PUSH (FOR BLOOD PRESSURE SUPPORT)
PREFILLED_SYRINGE | INTRAVENOUS | Status: AC
  Filled 2014-04-02: qty 10

## 2014-04-02 MED ORDER — BSS IO SOLN
INTRAOCULAR | Status: AC
  Filled 2014-04-02: qty 15

## 2014-04-02 MED ORDER — EPHEDRINE SULFATE 50 MG/ML IJ SOLN
INTRAMUSCULAR | Status: DC | PRN
  Administered 2014-04-02 (×2): 15 mg via INTRAVENOUS
  Administered 2014-04-02 (×2): 10 mg via INTRAVENOUS

## 2014-04-02 MED ORDER — MIDAZOLAM HCL 2 MG/2ML IJ SOLN
INTRAMUSCULAR | Status: AC
  Filled 2014-04-02: qty 2

## 2014-04-02 MED ORDER — BACITRACIN-POLYMYXIN B 500-10000 UNIT/GM OP OINT
TOPICAL_OINTMENT | OPHTHALMIC | Status: AC
  Filled 2014-04-02: qty 3.5

## 2014-04-02 MED ORDER — DOCUSATE SODIUM 100 MG PO CAPS
100.0000 mg | ORAL_CAPSULE | Freq: Two times a day (BID) | ORAL | Status: DC
  Administered 2014-04-02: 100 mg via ORAL
  Filled 2014-04-02: qty 1

## 2014-04-02 MED ORDER — PREDNISOLONE ACETATE 1 % OP SUSP
1.0000 [drp] | Freq: Four times a day (QID) | OPHTHALMIC | Status: DC
  Filled 2014-04-02: qty 1

## 2014-04-02 MED ORDER — MAGNESIUM HYDROXIDE 400 MG/5ML PO SUSP: 15.0000 mL | Freq: Four times a day (QID) | ORAL | Status: DC | PRN

## 2014-04-02 MED ORDER — PROPOFOL 10 MG/ML IV BOLUS
INTRAVENOUS | Status: AC
  Filled 2014-04-02: qty 20

## 2014-04-02 MED ORDER — BACITRACIN-POLYMYXIN B 500-10000 UNIT/GM OP OINT
TOPICAL_OINTMENT | OPHTHALMIC | Status: DC | PRN
  Administered 2014-04-02: 1 via OPHTHALMIC

## 2014-04-02 MED ORDER — NEOSTIGMINE METHYLSULFATE 10 MG/10ML IV SOLN
INTRAVENOUS | Status: AC
  Filled 2014-04-02: qty 1

## 2014-04-02 MED ORDER — SUCCINYLCHOLINE CHLORIDE 20 MG/ML IJ SOLN
INTRAMUSCULAR | Status: DC | PRN
  Administered 2014-04-02: 12 mg via INTRAVENOUS

## 2014-04-02 MED ORDER — TRIAMCINOLONE ACETONIDE 40 MG/ML IJ SUSP
INTRAMUSCULAR | Status: AC
  Filled 2014-04-02: qty 5

## 2014-04-02 MED ORDER — LATANOPROST 0.005 % OP SOLN
1.0000 [drp] | Freq: Every day | OPHTHALMIC | Status: DC
  Filled 2014-04-02: qty 2.5

## 2014-04-02 MED ORDER — BRIMONIDINE TARTRATE 0.2 % OP SOLN
1.0000 [drp] | Freq: Two times a day (BID) | OPHTHALMIC | Status: DC
  Administered 2014-04-02: 1 [drp] via OPHTHALMIC
  Filled 2014-04-02: qty 5

## 2014-04-02 MED ORDER — TETRACAINE HCL 0.5 % OP SOLN
2.0000 [drp] | Freq: Once | OPHTHALMIC | Status: DC
  Filled 2014-04-02: qty 2

## 2014-04-02 MED ORDER — GATIFLOXACIN 0.5 % OP SOLN
1.0000 [drp] | Freq: Four times a day (QID) | OPHTHALMIC | Status: DC
  Filled 2014-04-02: qty 2.5

## 2014-04-02 MED ORDER — BUPIVACAINE HCL (PF) 0.75 % IJ SOLN
INTRAMUSCULAR | Status: AC
  Filled 2014-04-02: qty 10

## 2014-04-02 MED ORDER — BUPIVACAINE HCL (PF) 0.75 % IJ SOLN
INTRAMUSCULAR | Status: DC | PRN
  Administered 2014-04-02: 10 mL

## 2014-04-02 MED ORDER — PROPOFOL 10 MG/ML IV BOLUS
INTRAVENOUS | Status: DC | PRN
  Administered 2014-04-02: 200 mg via INTRAVENOUS

## 2014-04-02 MED ORDER — MORPHINE SULFATE 2 MG/ML IJ SOLN: 1.0000 mg | INTRAMUSCULAR | Status: DC | PRN

## 2014-04-02 MED ORDER — LIDOCAINE HCL (CARDIAC) 20 MG/ML IV SOLN
INTRAVENOUS | Status: AC
  Filled 2014-04-02: qty 5

## 2014-04-02 MED ORDER — FENTANYL CITRATE 0.05 MG/ML IJ SOLN
INTRAMUSCULAR | Status: AC
  Filled 2014-04-02: qty 5

## 2014-04-02 MED ORDER — SODIUM CHLORIDE 0.9 % IV SOLN
INTRAVENOUS | Status: DC
  Administered 2014-04-02 (×2): via INTRAVENOUS

## 2014-04-02 MED ORDER — PHENYLEPHRINE HCL 10 MG/ML IJ SOLN
INTRAMUSCULAR | Status: DC | PRN
  Administered 2014-04-02: 160 ug via INTRAVENOUS
  Administered 2014-04-02: 80 ug via INTRAVENOUS
  Administered 2014-04-02: 160 ug via INTRAVENOUS
  Administered 2014-04-02 (×3): 80 ug via INTRAVENOUS

## 2014-04-02 MED ORDER — BACITRACIN-POLYMYXIN B 500-10000 UNIT/GM OP OINT
1.0000 "application " | TOPICAL_OINTMENT | Freq: Four times a day (QID) | OPHTHALMIC | Status: DC
  Filled 2014-04-02: qty 3.5

## 2014-04-02 MED ORDER — STERILE WATER FOR IRRIGATION IR SOLN
Status: DC | PRN
  Administered 2014-04-02: 200 mL

## 2014-04-02 MED ORDER — ONDANSETRON HCL 4 MG/2ML IJ SOLN
INTRAMUSCULAR | Status: AC
  Filled 2014-04-02: qty 2

## 2014-04-02 MED ORDER — NEOSTIGMINE METHYLSULFATE 10 MG/10ML IV SOLN
INTRAVENOUS | Status: DC | PRN
  Administered 2014-04-02: 5 mg via INTRAVENOUS

## 2014-04-02 MED ORDER — SODIUM CHLORIDE 0.45 % IV SOLN
INTRAVENOUS | Status: DC
  Administered 2014-04-02: 16:00:00 via INTRAVENOUS

## 2014-04-02 MED ORDER — DEXAMETHASONE SODIUM PHOSPHATE 10 MG/ML IJ SOLN
INTRAMUSCULAR | Status: AC
  Filled 2014-04-02: qty 1

## 2014-04-02 MED ORDER — HYDROCODONE-ACETAMINOPHEN 5-325 MG PO TABS: 1.0000 | ORAL_TABLET | ORAL | Status: DC | PRN

## 2014-04-02 MED ORDER — ATROPINE SULFATE 1 % OP OINT
TOPICAL_OINTMENT | OPHTHALMIC | Status: DC | PRN
  Administered 2014-04-02: 1 via OPHTHALMIC

## 2014-04-02 MED ORDER — KETOROLAC TROMETHAMINE 0.5 % OP SOLN
1.0000 [drp] | Freq: Four times a day (QID) | OPHTHALMIC | Status: DC
Start: 2014-04-02 — End: 2014-04-03
  Administered 2014-04-02: 1 [drp] via OPHTHALMIC
  Filled 2014-04-02: qty 3

## 2014-04-02 MED ORDER — ONDANSETRON HCL 4 MG/2ML IJ SOLN: 4.0000 mg | Freq: Four times a day (QID) | INTRAMUSCULAR | Status: DC | PRN

## 2014-04-02 MED ORDER — GLYCOPYRROLATE 0.2 MG/ML IJ SOLN
INTRAMUSCULAR | Status: AC
  Filled 2014-04-02: qty 4

## 2014-04-02 MED ORDER — ACETAZOLAMIDE SODIUM 500 MG IJ SOLR
500.0000 mg | Freq: Once | INTRAMUSCULAR | Status: AC
  Administered 2014-04-03: 500 mg via INTRAVENOUS
  Filled 2014-04-02: qty 500

## 2014-04-02 MED ORDER — HEMOSTATIC AGENTS (NO CHARGE) OPTIME
TOPICAL | Status: DC | PRN
  Administered 2014-04-02: 1 via TOPICAL

## 2014-04-02 MED ORDER — POLYMYXIN B SULFATE 500000 UNITS IJ SOLR
INTRAMUSCULAR | Status: DC | PRN
  Administered 2014-04-02: 13:00:00

## 2014-04-02 MED ORDER — ONDANSETRON HCL 4 MG/2ML IJ SOLN
INTRAMUSCULAR | Status: DC | PRN
  Administered 2014-04-02: 4 mg via INTRAVENOUS

## 2014-04-02 MED ORDER — EPINEPHRINE HCL 1 MG/ML IJ SOLN
INTRAMUSCULAR | Status: AC
  Filled 2014-04-02: qty 1

## 2014-04-02 MED ORDER — SODIUM CHLORIDE 0.9 % IJ SOLN
INTRAMUSCULAR | Status: AC
  Filled 2014-04-02: qty 10

## 2014-04-02 MED ORDER — GLYCOPYRROLATE 0.2 MG/ML IJ SOLN
INTRAMUSCULAR | Status: DC | PRN
  Administered 2014-04-02: .8 mg via INTRAVENOUS

## 2014-04-02 MED ORDER — EPINEPHRINE HCL 1 MG/ML IJ SOLN
INTRAMUSCULAR | Status: DC | PRN
  Administered 2014-04-02: 13:00:00

## 2014-04-02 MED ORDER — SODIUM HYALURONATE 10 MG/ML IO SOLN
INTRAOCULAR | Status: DC | PRN
  Administered 2014-04-02: 0.85 mL via INTRAOCULAR

## 2014-04-02 MED ORDER — BRIMONIDINE TARTRATE 0.2 % OP SOLN: 1.0000 [drp] | Freq: Two times a day (BID) | OPHTHALMIC | Status: DC

## 2014-04-02 MED ORDER — ATROPINE SULFATE 1 % OP SOLN
OPHTHALMIC | Status: AC
  Filled 2014-04-02: qty 2

## 2014-04-02 MED ORDER — BSS PLUS IO SOLN
INTRAOCULAR | Status: AC
  Filled 2014-04-02: qty 500

## 2014-04-02 MED ORDER — LIDOCAINE HCL (CARDIAC) 20 MG/ML IV SOLN
INTRAVENOUS | Status: DC | PRN
  Administered 2014-04-02: 80 mg via INTRAVENOUS

## 2014-04-02 MED ORDER — ROCURONIUM BROMIDE 100 MG/10ML IV SOLN
INTRAVENOUS | Status: DC | PRN
  Administered 2014-04-02: 50 mg via INTRAVENOUS

## 2014-04-02 MED ORDER — ARTIFICIAL TEARS OP OINT
TOPICAL_OINTMENT | OPHTHALMIC | Status: AC
  Filled 2014-04-02: qty 3.5

## 2014-04-02 MED ORDER — ACETAMINOPHEN 325 MG PO TABS: 325.0000 mg | ORAL_TABLET | ORAL | Status: DC | PRN

## 2014-04-02 MED ORDER — TEMAZEPAM 15 MG PO CAPS
15.0000 mg | ORAL_CAPSULE | Freq: Every evening | ORAL | Status: DC | PRN
Start: 2014-04-02 — End: 2014-04-03

## 2014-04-02 MED ORDER — FENTANYL CITRATE 0.05 MG/ML IJ SOLN
INTRAMUSCULAR | Status: DC | PRN
  Administered 2014-04-02: 100 ug via INTRAVENOUS

## 2014-04-02 MED ORDER — GENTAMICIN SULFATE 40 MG/ML IJ SOLN
INTRAMUSCULAR | Status: AC
  Filled 2014-04-02: qty 2

## 2014-04-02 MED ORDER — HYPROMELLOSE (GONIOSCOPIC) 2.5 % OP SOLN
OPHTHALMIC | Status: AC
  Filled 2014-04-02: qty 15

## 2014-04-02 MED ORDER — DEXAMETHASONE SODIUM PHOSPHATE 10 MG/ML IJ SOLN
INTRAMUSCULAR | Status: DC | PRN
  Administered 2014-04-02: 10 mg

## 2014-04-02 MED ORDER — MIDAZOLAM HCL 5 MG/5ML IJ SOLN
INTRAMUSCULAR | Status: DC | PRN
  Administered 2014-04-02: 2 mg via INTRAVENOUS

## 2014-04-02 MED ORDER — 0.9 % SODIUM CHLORIDE (POUR BTL) OPTIME
TOPICAL | Status: DC | PRN
  Administered 2014-04-02: 200 mL

## 2014-04-02 MED ORDER — POLYMYXIN B SULFATE 500000 UNITS IJ SOLR
INTRAMUSCULAR | Status: AC
  Filled 2014-04-02: qty 1

## 2014-04-02 SURGICAL SUPPLY — 64 items
BLADE EYE CATARACT 19 1.4 BEAV (BLADE) IMPLANT
BLADE MVR KNIFE 19G (BLADE) IMPLANT
BLADE MVR KNIFE 20G (BLADE) ×2 IMPLANT
CANNULA ANT CHAM MAIN (OPHTHALMIC RELATED) IMPLANT
CANNULA VLV SOFT TIP 25GA (OPHTHALMIC) ×2 IMPLANT
CORDS BIPOLAR (ELECTRODE) ×2 IMPLANT
COTTONBALL LRG STERILE PKG (GAUZE/BANDAGES/DRESSINGS) ×6 IMPLANT
COVER MAYO STAND STRL (DRAPES) ×2 IMPLANT
DRAPE INCISE 51X51 W/FILM STRL (DRAPES) ×2 IMPLANT
DRAPE OPHTHALMIC 77X100 STRL (CUSTOM PROCEDURE TRAY) ×2 IMPLANT
ERASER HMR WETFIELD 23G BP (MISCELLANEOUS) IMPLANT
FILTER BLUE MILLIPORE (MISCELLANEOUS) IMPLANT
FILTER STRAW FLUID ASPIR (MISCELLANEOUS) ×2 IMPLANT
FORCEPS ILM 25G DSP TIP (MISCELLANEOUS) IMPLANT
GAS AUTO FILL CONSTEL (OPHTHALMIC) ×2
GAS AUTO FILL CONSTELLATION (OPHTHALMIC) ×1 IMPLANT
GAS OPHTHALMIC (MISCELLANEOUS) ×2 IMPLANT
GLOVE BIO SURGEON STRL SZ7 (GLOVE) ×2 IMPLANT
GLOVE SS BIOGEL STRL SZ 6.5 (GLOVE) ×1 IMPLANT
GLOVE SS BIOGEL STRL SZ 7 (GLOVE) ×1 IMPLANT
GLOVE SUPERSENSE BIOGEL SZ 6.5 (GLOVE) ×1
GLOVE SUPERSENSE BIOGEL SZ 7 (GLOVE) ×1
GLOVE SURG 8.5 LATEX PF (GLOVE) ×2 IMPLANT
GOWN STRL REUS W/ TWL LRG LVL3 (GOWN DISPOSABLE) ×3 IMPLANT
GOWN STRL REUS W/TWL LRG LVL3 (GOWN DISPOSABLE) ×3
HANDLE PNEUMATIC FOR CONSTEL (OPHTHALMIC) IMPLANT
KIT BASIN OR (CUSTOM PROCEDURE TRAY) ×2 IMPLANT
KIT ROOM TURNOVER OR (KITS) ×2 IMPLANT
KNIFE GRIESHABER SHARP 2.5MM (MISCELLANEOUS) IMPLANT
MICROPICK 25G (MISCELLANEOUS)
NEEDLE 18GX1X1/2 (RX/OR ONLY) (NEEDLE) ×2 IMPLANT
NEEDLE 25GX 5/8IN NON SAFETY (NEEDLE) ×2 IMPLANT
NEEDLE 27GAX1X1/2 (NEEDLE) IMPLANT
NEEDLE FILTER BLUNT 18X 1/2SAF (NEEDLE) ×1
NEEDLE FILTER BLUNT 18X1 1/2 (NEEDLE) ×1 IMPLANT
NEEDLE HYPO 30X.5 LL (NEEDLE) ×6 IMPLANT
NS IRRIG 1000ML POUR BTL (IV SOLUTION) ×2 IMPLANT
PACK FRAGMATOME (OPHTHALMIC) IMPLANT
PACK VITRECTOMY CUSTOM (CUSTOM PROCEDURE TRAY) ×2 IMPLANT
PAD ARMBOARD 7.5X6 YLW CONV (MISCELLANEOUS) ×4 IMPLANT
PAK PIK VITRECTOMY CVS 25GA (OPHTHALMIC) ×2 IMPLANT
PIC ILLUMINATED 25G (OPHTHALMIC) ×2
PICK MICROPICK 25G (MISCELLANEOUS) IMPLANT
PIK ILLUMINATED 25G (OPHTHALMIC) ×1 IMPLANT
PROBE LASER ILLUM FLEX CVD 25G (OPHTHALMIC) ×2 IMPLANT
REPL STRA BRUSH NEEDLE (NEEDLE) ×4 IMPLANT
RESERVOIR BACK FLUSH (MISCELLANEOUS) ×4 IMPLANT
ROLLS DENTAL (MISCELLANEOUS) ×4 IMPLANT
SCRAPER DIAMOND 25GA (OPHTHALMIC RELATED) IMPLANT
SCRAPER DIAMOND DUST MEMBRANE (MISCELLANEOUS) ×2 IMPLANT
SPONGE SURGIFOAM ABS GEL 12-7 (HEMOSTASIS) ×2 IMPLANT
STOPCOCK 4 WAY LG BORE MALE ST (IV SETS) IMPLANT
SUT CHROMIC 7 0 TG140 8 (SUTURE) IMPLANT
SUT ETHILON 9 0 TG140 8 (SUTURE) ×2 IMPLANT
SUT POLY NON ABSORB 10-0 8 STR (SUTURE) IMPLANT
SUT SILK 4 0 RB 1 (SUTURE) IMPLANT
SYR 20CC LL (SYRINGE) ×2 IMPLANT
SYR 5ML LL (SYRINGE) IMPLANT
SYR BULB 3OZ (MISCELLANEOUS) ×2 IMPLANT
SYR TB 1ML LUER SLIP (SYRINGE) ×2 IMPLANT
SYRINGE 10CC LL (SYRINGE) ×2 IMPLANT
TOWEL OR 17X24 6PK STRL BLUE (TOWEL DISPOSABLE) ×6 IMPLANT
WATER STERILE IRR 1000ML POUR (IV SOLUTION) ×2 IMPLANT
WIPE INSTRUMENT VISIWIPE 73X73 (MISCELLANEOUS) ×2 IMPLANT

## 2014-04-02 NOTE — Anesthesia Preprocedure Evaluation (Addendum)
Anesthesia Evaluation  Patient identified by MRN, date of birth, ID band Patient awake    Reviewed: Allergy & Precautions, H&P , NPO status   History of Anesthesia Complications (+) PONV  Airway Mallampati: II  Neck ROM: Full  Mouth opening: Limited Mouth Opening  Dental  (+) Teeth Intact, Poor Dentition   Pulmonary asthma , Current Smoker,  breath sounds clear to auscultation        Cardiovascular negative cardio ROS  Rhythm:Regular Rate:Normal     Neuro/Psych    GI/Hepatic Neg liver ROS,   Endo/Other  negative endocrine ROS  Renal/GU negative Renal ROS     Musculoskeletal   Abdominal   Peds  Hematology negative hematology ROS (+)   Anesthesia Other Findings   Reproductive/Obstetrics                          Anesthesia Physical Anesthesia Plan  ASA: II  Anesthesia Plan: General   Post-op Pain Management:    Induction: Intravenous  Airway Management Planned: Oral ETT and Video Laryngoscope Planned  Additional Equipment:   Intra-op Plan:   Post-operative Plan: Extubation in OR  Informed Consent: I have reviewed the patients History and Physical, chart, labs and discussed the procedure including the risks, benefits and alternatives for the proposed anesthesia with the patient or authorized representative who has indicated his/her understanding and acceptance.   Dental advisory given  Plan Discussed with: CRNA and Surgeon  Anesthesia Plan Comments:        Anesthesia Quick Evaluation

## 2014-04-02 NOTE — H&P (Signed)
I examined the patient today and there is no change in the medical status 

## 2014-04-02 NOTE — OR Nursing (Signed)
1435 : Green gas alert bracelet applied to LEFT wrist.

## 2014-04-02 NOTE — Progress Notes (Signed)
Report given to angel rn as caregiver 

## 2014-04-02 NOTE — Op Note (Signed)
NAME:  Robert [name], MARKIE NO.:  [id number]  MEDICAL RECORD NO.:  [id number]  LOCATION:  6N05C                        FACILITY:  [facility]  PHYSICIAN:  Chrystie Nose. Zigmund Daniel, M.D. DATE OF BIRTH:  [birth year]  DATE OF PROCEDURE:  04/02/2014 DATE OF DISCHARGE:                              OPERATIVE REPORT   ADMISSION DIAGNOSIS:  Macular hole, right eye.  PROCEDURE:  Pars plana vitrectomy, right eye; retinal photocoagulation, right eye; membrane peel, right eye; gas fluid exchange, right eye; serum patch, right eye.  SURGEON:  Chrystie Nose. Zigmund Daniel, M.D.  ASSISTANT:  Deatra Ina, SA.  ANESTHESIA:  General.  DETAILS:  Usual prep and drape, indirect ophthalmoscopy with the indirect ophthalmoscope laser was moved into place.  The retinal break was seen at 12 o'clock.  This was reinforced with laser as a power was 400 mW 1000 microns each and 0.1 seconds each.  A 750 burns were placed around the retinal break and around the retinal periphery with 2 rows of laser.  Once this was accomplished, the attention was carried to the pars plana area 25-gauge trocars at 8 and 10 o'clock.  A 20-gauge MVR 3 layered scleral incision at 2 o'clock.  The contact lens ring was anchored into place at 6 and 12 o'clock.  Pars plana vitrectomy was begun just behind the pseudophakias.  The vitrectomy was carried posteriorly and white membranes were encountered.  The vitrectomy was carried into the mid peripheral vitreous space and vitreous was removed for 360 degrees.  The silicone tip suction line was drawn down to the macular surface and the fish strike sign occurred several times.  The lighted pick and the suction line were used to engage posterior vitreous and pull it from its attachments to the macula and the edge of the macular hole.  Once this was accomplished, the 30-degree prismatic lens was moved into place.  The vitrectomy was carried out into the mid periphery and the far periphery down to  the vitreous base.  The magnifying contact lens was then placed into the contact lens ring for a maximum viewing of the macular region.  The internal limiting membrane was removed with the 20-gauge diamond dusted membrane scraper and the vitreous cutter.  The edges of the macular hole were freed and allowed to lie flat.  The double wide viewing system was moved into place with the double wide lens.  Gas fluid exchange was carried out.  Additional fluid was removed from the vitreous cavity, so until it was totally dry. Sufficient time was allowed for additional fluid to track down the walls of the eye in the posterior segment.  Serum patch was prepared during this time, and C3 F8 was prepared at a 14% concentration.  Additional fluid was removed with a [place] [facility] brush and the silicone tip suction line.  Serum patch was delivered.  Excess serum was then removed and C3 F8 14% was exchanged for intravitreal gas.  The instruments were removed from the eye and the 25-gauge trocars were removed.  The scleral wound was closed with 190 nylon suture.  The conjunctiva was closed with wet-field cautery.  Polymyxin and gentamicin were irrigated into tenon space.  Atropine solution was applied.  The closing tension was 10 with a Barraquer tonometer.  A 10 mg of Decadron was injected into the lower subconjunctival space.  Polysporin ophthalmic ointment a patch and shield were placed.  The patient was awakened and taken to recovery in satisfactory condition.     Chrystie Nose. Zigmund Daniel, M.D.     JDM/MEDQ  D:  04/02/2014  T:  04/02/2014  Job:  [id number]

## 2014-04-02 NOTE — Transfer of Care (Signed)
Immediate Anesthesia Transfer of Care Note  Patient: Robert [name]  Procedure(s) Performed: Procedure(s): 20-25 GAUGE PARS PLANA VITRECTOMY ; MEMBRANE PEEL; HEADSCOPE LASER; SERUM PATCH; GAS/FLUID EXCHANGE ;C3F8 (Right)  Patient Location: PACU  Anesthesia Type:General  Level of Consciousness: lethargic and responds to stimulation  Airway & Oxygen Therapy: Patient Spontanous Breathing and Patient connected to nasal cannula oxygen  Post-op Assessment: Report given to PACU RN  Post vital signs: Reviewed and stable  Complications: No apparent anesthesia complications

## 2014-04-02 NOTE — Anesthesia Procedure Notes (Signed)
Procedure Name: Intubation Date/Time: 04/02/2014 1:28 PM Performed by: Sampson Si E Pre-anesthesia Checklist: Patient identified, Emergency Drugs available, Suction available and Patient being monitored Patient Re-evaluated:Patient Re-evaluated prior to inductionOxygen Delivery Method: Circle system utilized Preoxygenation: Pre-oxygenation with 100% oxygen Intubation Type: IV induction and Rapid sequence Ventilation: Mask ventilation with difficulty, Two handed mask ventilation required and Oral airway inserted - appropriate to patient size Grade View: Grade I Tube size: 7.5 mm Number of attempts: 1 Airway Equipment and Method: Stylet and Video-laryngoscopy Placement Confirmation: ETT inserted through vocal cords under direct vision,  positive ETCO2 and breath sounds checked- equal and bilateral Secured at: 22 cm Tube secured with: Tape Dental Injury: Teeth and Oropharynx as per pre-operative assessment

## 2014-04-02 NOTE — Brief Op Note (Signed)
04/02/2014  2:43 PM  PATIENT:  Robert [name]  73 y.o. male  PRE-OPERATIVE DIAGNOSIS:  macular hole right eye  POST-OPERATIVE DIAGNOSIS:  Macular hole right eye  PROCEDURE:  Procedure(s): 20-25 GAUGE PARS PLANA VITRECTOMY ; MEMBRANE PEEL; HEADSCOPE LASER; SERUM PATCH; GAS/FLUID EXCHANGE ;C3F8 (Right)  SURGEON:  Surgeon(s) and Role:    * Hayden Pedro, MD - Primary  Brief Operative note   Preoperative diagnosis:  macular hole right eye Postoperative diagnosis  Post-Op Diagnosis Codes:    * Macular cyst, hole, or pseudohole of retina [362.54]   Surgeon:  Hayden Pedro, MD...  Assistant:  Deatra Ina SA    Anesthesia: General  Specimen: none  Estimated blood loss:  1cc  Complications: none  Patient sent to PACU in good condition  Composed by Hayden Pedro MD  Dictation number: [phone]

## 2014-04-03 ENCOUNTER — Encounter (HOSPITAL_COMMUNITY): Payer: Self-pay | Admitting: Ophthalmology

## 2014-04-03 DIAGNOSIS — H35349 Macular cyst, hole, or pseudohole, unspecified eye: Secondary | ICD-10-CM | POA: Diagnosis not present

## 2014-04-03 MED ORDER — PREDNISOLONE ACETATE 1 % OP SUSP: 1.0000 [drp] | Freq: Four times a day (QID) | OPHTHALMIC | Status: DC

## 2014-04-03 MED ORDER — LATANOPROST 0.005 % OP SOLN: 1.0000 [drp] | Freq: Every day | OPHTHALMIC | Status: DC

## 2014-04-03 MED ORDER — BACITRACIN-POLYMYXIN B 500-10000 UNIT/GM OP OINT: 1.0000 "application " | TOPICAL_OINTMENT | Freq: Four times a day (QID) | OPHTHALMIC | Status: DC

## 2014-04-03 MED ORDER — GATIFLOXACIN 0.5 % OP SOLN: 1.0000 [drp] | Freq: Four times a day (QID) | OPHTHALMIC | Status: DC

## 2014-04-03 NOTE — Progress Notes (Signed)
04/03/2014, 6:33 AM  Mental Status:  Awake, Alert, Oriented  Anterior segment: Cornea  Clear    Anterior Chamber Clear    Lens:    IOL  Intra Ocular Pressure 15 mmHg with Tonopen  Vitreous: Clear 95%gas bubble   Retina:  Attached Good laser reaction   Impression: Excellent result Retina attached   Final Diagnosis: Principal Problem:   Macular hole, right eye   Plan: start post operative eye drops.  Discharge to home.  Give post operative instructions  Robert [name] 04/03/2014, 6:33 AM

## 2014-04-03 NOTE — Discharge Summary (Signed)
Discharge summary not needed on [facility] patients per medical records. 

## 2014-04-03 NOTE — Progress Notes (Signed)
Discharge home. Home discharge instruction given, no questions verbalized. 

## 2014-04-04 NOTE — Anesthesia Postprocedure Evaluation (Signed)
  Anesthesia Post-op Note  Patient: Robert [name]  Procedure(s) Performed: Procedure(s): 20-25 GAUGE PARS PLANA VITRECTOMY ; MEMBRANE PEEL; HEADSCOPE LASER; SERUM PATCH; GAS/FLUID EXCHANGE ;C3F8 (Right)  Patient Location: PACU  Anesthesia Type:MAC  Level of Consciousness: awake and alert   Airway and Oxygen Therapy: Patient Spontanous Breathing  Post-op Pain: mild  Post-op Assessment: Post-op Vital signs reviewed  Post-op Vital Signs: stable  Last Vitals:  Filed Vitals:   04/03/14 0654  BP: 133/84  Pulse: 79  Temp: 36.6 C  Resp: 17    Complications: No apparent anesthesia complications

## 2014-04-09 ENCOUNTER — Inpatient Hospital Stay (INDEPENDENT_AMBULATORY_CARE_PROVIDER_SITE_OTHER): Payer: Medicare Other | Admitting: Ophthalmology

## 2014-04-09 DIAGNOSIS — H35341 Macular cyst, hole, or pseudohole, right eye: Secondary | ICD-10-CM

## 2014-05-01 ENCOUNTER — Encounter (INDEPENDENT_AMBULATORY_CARE_PROVIDER_SITE_OTHER): Payer: Medicare Other | Admitting: Ophthalmology

## 2014-05-01 ENCOUNTER — Encounter (HOSPITAL_COMMUNITY): Payer: Self-pay | Admitting: Pharmacy Technician

## 2014-05-01 DIAGNOSIS — H33021 Retinal detachment with multiple breaks, right eye: Secondary | ICD-10-CM

## 2014-05-01 DIAGNOSIS — H35341 Macular cyst, hole, or pseudohole, right eye: Secondary | ICD-10-CM

## 2014-05-02 DIAGNOSIS — H33001 Unspecified retinal detachment with retinal break, right eye: Secondary | ICD-10-CM | POA: Diagnosis present

## 2014-05-02 HISTORY — DX: Unspecified retinal detachment with retinal break, right eye: H33.001

## 2014-05-02 NOTE — H&P (Signed)
Robert [name] is an 73 y.o. male.   Chief Complaint:loss of vision right eye HPI: Patient had macular hole surgery one week ago.  Now Rhegmatogenous retinal detachment right eye  Past Medical History  Diagnosis Date  . Umbilical hernia   . Transverse myelitis 2009    was treated by Dr Erling Cruz  . Hay fever   . Neuromuscular disorder     numbness in feet  . PONV (postoperative nausea and vomiting)   . Childhood asthma   . Sinus headache     "hay fever season; not that often"    Past Surgical History  Procedure Laterality Date  . Pars plana vitrectomy  01/18/2012    Procedure: PARS PLANA VITRECTOMY WITH 25 GAUGE;  Surgeon: Hayden Pedro, MD;  Location: [facility];  Service: Ophthalmology;  Laterality: Left;  25g. ppv for macular hole ; Laser treatment;Serum patch and Gas Injection(C3F8)  . Eye surgery    . Cataract extraction w/ intraocular lens  implant, bilateral Bilateral 2014  . 25 gauge pars plana vitrectomy with 20 gauge mvr port for macular hole Left 03/08/2013    Procedure: 63 GAUGE PARS PLANA VITRECTOMY WITH 20 GAUGE MVR PORT FOR MACULAR HOLE;  Surgeon: Hayden Pedro, MD;  Location: [place];  Service: Ophthalmology;  Laterality: Left;  . Gas insertion Left 03/08/2013    Procedure: INSERTION OF GAS;  Surgeon: Hayden Pedro, MD;  Location: [place];  Service: Ophthalmology;  Laterality: Left;  C3F8  . Photocoagulation with laser Left 03/08/2013    Procedure: PHOTOCOAGULATION WITH LASER;  Surgeon: Hayden Pedro, MD;  Location: [place];  Service: Ophthalmology;  Laterality: Left;  Headscope   . Pars plana vitrectomy w/ repair of macular hole Right 04/02/2014  . 25 gauge pars plana vitrectomy with 20 gauge mvr port for macular hole Right 04/02/2014    Procedure: 20-25 GAUGE PARS PLANA VITRECTOMY ; MEMBRANE PEEL; HEADSCOPE LASER; SERUM PATCH; GAS/FLUID EXCHANGE ;C3F8;  Surgeon: Hayden Pedro, MD;  Location: [facility];  Service: Ophthalmology;  Laterality: Right;    Family History  Problem  Relation Age of Onset  . Heart disease Mother   . Cancer Mother   . Heart attack Father    Social History:  reports that he has been smoking Cigarettes.  He has a 104 pack-year smoking history. He has never used smokeless tobacco. He reports that he does not drink alcohol or use illicit drugs.  Allergies:  Allergies  Allergen Reactions  . Typhoid Vaccines Swelling  . Codeine     Puts me to sleep  . Ivp Dye [Iodinated Diagnostic Agents] Hives and Itching    Face got splotchy.      No prescriptions prior to admission    Review of systems otherwise negative  There were no vitals taken for this visit.  Physical exam: Mental status: oriented x3. Eyes: See eye exam associated with this date of surgery in media tab.  Scanned in by scanning center Ears, Nose, Throat: within normal limits Neck: Within Normal limits General: within normal limits Chest: Within normal limits Breast: deferred Heart: Within normal limits Abdomen: Within normal limits GU: deferred Extremities: within normal limits Skin: within normal limits  Assessment/Plan Rhegmatogenous retinal detachment right eye Plan: To [facility] for Scleral buckle, possible pars plana vitrectomy, laser, gas injection right eye  Hayden Pedro 05/02/2014, 7:33 AM

## 2014-05-06 ENCOUNTER — Encounter (HOSPITAL_COMMUNITY): Payer: Self-pay | Admitting: *Deleted

## 2014-05-06 MED ORDER — PHENYLEPHRINE HCL 2.5 % OP SOLN
1.0000 [drp] | OPHTHALMIC | Status: AC | PRN
  Administered 2014-05-07: 1 [drp] via OPHTHALMIC
  Filled 2014-05-06: qty 2

## 2014-05-06 MED ORDER — CYCLOPENTOLATE HCL 1 % OP SOLN
1.0000 [drp] | OPHTHALMIC | Status: AC | PRN
  Administered 2014-05-07: 1 [drp] via OPHTHALMIC
  Filled 2014-05-06: qty 2

## 2014-05-06 MED ORDER — CEFAZOLIN SODIUM-DEXTROSE 2-3 GM-% IV SOLR
2.0000 g | INTRAVENOUS | Status: DC
  Filled 2014-05-06: qty 50

## 2014-05-06 MED ORDER — TROPICAMIDE 1 % OP SOLN
1.0000 [drp] | OPHTHALMIC | Status: AC | PRN
  Administered 2014-05-07: 1 [drp] via OPHTHALMIC
  Filled 2014-05-06: qty 3

## 2014-05-06 MED ORDER — GATIFLOXACIN 0.5 % OP SOLN
1.0000 [drp] | OPHTHALMIC | Status: AC | PRN
  Administered 2014-05-07: 1 [drp] via OPHTHALMIC
  Filled 2014-05-06: qty 2.5

## 2014-05-07 ENCOUNTER — Encounter (HOSPITAL_COMMUNITY): Payer: Self-pay | Admitting: *Deleted

## 2014-05-07 ENCOUNTER — Encounter (HOSPITAL_COMMUNITY)
Admission: RE | Disposition: A | Discharge status: Home / Self Care | Payer: Self-pay | Source: Ambulatory Visit | Attending: Ophthalmology

## 2014-05-07 ENCOUNTER — Ambulatory Visit (HOSPITAL_COMMUNITY): Payer: Medicare Other | Admitting: Certified Registered Nurse Anesthetist

## 2014-05-07 ENCOUNTER — Ambulatory Visit (HOSPITAL_COMMUNITY)
Admission: RE | Admit: 2014-05-07 | Discharge: 2014-05-08 | Disposition: A | Discharge status: Home / Self Care | Payer: Medicare Other | Source: Ambulatory Visit | Attending: Ophthalmology | Admitting: Ophthalmology

## 2014-05-07 DIAGNOSIS — J449 Chronic obstructive pulmonary disease, unspecified: Secondary | ICD-10-CM | POA: Insufficient documentation

## 2014-05-07 DIAGNOSIS — H35341 Macular cyst, hole, or pseudohole, right eye: Secondary | ICD-10-CM

## 2014-05-07 DIAGNOSIS — H33001 Unspecified retinal detachment with retinal break, right eye: Secondary | ICD-10-CM | POA: Diagnosis not present

## 2014-05-07 DIAGNOSIS — F17210 Nicotine dependence, cigarettes, uncomplicated: Secondary | ICD-10-CM | POA: Diagnosis not present

## 2014-05-07 HISTORY — PX: PARS PLANA VITRECTOMY: SHX2166

## 2014-05-07 HISTORY — PX: GAS/FLUID EXCHANGE: SHX5334

## 2014-05-07 HISTORY — PX: SCLERAL BUCKLE: SHX5340

## 2014-05-07 HISTORY — PX: REPAIR OF COMPLEX TRACTION RETINAL DETACHMENT: SHX6217

## 2014-05-07 HISTORY — PX: GAS INSERTION: SHX5336

## 2014-05-07 HISTORY — PX: LASER PHOTO ABLATION: SHX5942

## 2014-05-07 LAB — CBC
HCT: 44.1 % (ref 39.0–52.0)
Hemoglobin: 15.9 g/dL (ref 13.0–17.0)
MCH: 31.9 pg (ref 26.0–34.0)
MCHC: 36.1 g/dL — ABNORMAL HIGH (ref 30.0–36.0)
MCV: 88.4 fL (ref 78.0–100.0)
Platelets: 175 10*3/uL (ref 150–400)
RBC: 4.99 MIL/uL (ref 4.22–5.81)
RDW: 12.3 % (ref 11.5–15.5)
WBC: 13.5 10*3/uL — ABNORMAL HIGH (ref 4.0–10.5)

## 2014-05-07 SURGERY — SCLERAL BUCKLE
Anesthesia: General | Site: Eye | Laterality: Right

## 2014-05-07 MED ORDER — FENTANYL CITRATE 0.05 MG/ML IJ SOLN
INTRAMUSCULAR | Status: AC
  Filled 2014-05-07: qty 2

## 2014-05-07 MED ORDER — GATIFLOXACIN 0.5 % OP SOLN
1.0000 [drp] | OPHTHALMIC | Status: AC | PRN
  Administered 2014-05-07 (×2): 1 [drp] via OPHTHALMIC

## 2014-05-07 MED ORDER — PROMETHAZINE HCL 25 MG/ML IJ SOLN: 6.2500 mg | INTRAMUSCULAR | Status: DC | PRN

## 2014-05-07 MED ORDER — KETOROLAC TROMETHAMINE 0.5 % OP SOLN
1.0000 [drp] | Freq: Four times a day (QID) | OPHTHALMIC | Status: DC
  Filled 2014-05-07: qty 3

## 2014-05-07 MED ORDER — DEXAMETHASONE SODIUM PHOSPHATE 4 MG/ML IJ SOLN
INTRAMUSCULAR | Status: DC | PRN
  Administered 2014-05-07: 8 mg via INTRAVENOUS

## 2014-05-07 MED ORDER — MIDAZOLAM HCL 2 MG/2ML IJ SOLN: 0.5000 mg | Freq: Once | INTRAMUSCULAR | Status: DC | PRN

## 2014-05-07 MED ORDER — LIDOCAINE HCL (CARDIAC) 20 MG/ML IV SOLN
INTRAVENOUS | Status: AC
  Filled 2014-05-07: qty 5

## 2014-05-07 MED ORDER — FENTANYL CITRATE 0.05 MG/ML IJ SOLN
INTRAMUSCULAR | Status: AC
  Filled 2014-05-07: qty 5

## 2014-05-07 MED ORDER — PHENYLEPHRINE HCL 10 MG/ML IJ SOLN
INTRAMUSCULAR | Status: DC | PRN
  Administered 2014-05-07: 80 ug via INTRAVENOUS
  Administered 2014-05-07: 40 ug via INTRAVENOUS
  Administered 2014-05-07: 80 ug via INTRAVENOUS
  Administered 2014-05-07: 120 ug via INTRAVENOUS
  Administered 2014-05-07 (×2): 80 ug via INTRAVENOUS
  Administered 2014-05-07: 120 ug via INTRAVENOUS
  Administered 2014-05-07: 80 ug via INTRAVENOUS
  Administered 2014-05-07: 40 ug via INTRAVENOUS
  Administered 2014-05-07: 80 ug via INTRAVENOUS

## 2014-05-07 MED ORDER — ONDANSETRON HCL 4 MG/2ML IJ SOLN
INTRAMUSCULAR | Status: DC | PRN
  Administered 2014-05-07: 4 mg via INTRAVENOUS

## 2014-05-07 MED ORDER — ONDANSETRON HCL 4 MG/2ML IJ SOLN: 4.0000 mg | Freq: Four times a day (QID) | INTRAMUSCULAR | Status: DC | PRN

## 2014-05-07 MED ORDER — DOCUSATE SODIUM 100 MG PO CAPS
100.0000 mg | ORAL_CAPSULE | Freq: Two times a day (BID) | ORAL | Status: DC
  Administered 2014-05-07: 100 mg via ORAL
  Filled 2014-05-07: qty 1

## 2014-05-07 MED ORDER — TEMAZEPAM 15 MG PO CAPS: 15.0000 mg | ORAL_CAPSULE | Freq: Every evening | ORAL | Status: DC | PRN

## 2014-05-07 MED ORDER — NEOSTIGMINE METHYLSULFATE 10 MG/10ML IV SOLN
INTRAVENOUS | Status: DC | PRN
  Administered 2014-05-07: 1 mg via INTRAVENOUS
  Administered 2014-05-07: 3 mg via INTRAVENOUS

## 2014-05-07 MED ORDER — BUPIVACAINE HCL (PF) 0.75 % IJ SOLN
INTRAMUSCULAR | Status: DC | PRN
  Administered 2014-05-07: 10 mL

## 2014-05-07 MED ORDER — SODIUM HYALURONATE 10 MG/ML IO SOLN
INTRAOCULAR | Status: DC | PRN
  Administered 2014-05-07: 0.85 mL via INTRAOCULAR

## 2014-05-07 MED ORDER — TETRACAINE HCL 0.5 % OP SOLN
2.0000 [drp] | Freq: Once | OPHTHALMIC | Status: DC
  Filled 2014-05-07: qty 2

## 2014-05-07 MED ORDER — MIDAZOLAM HCL 2 MG/2ML IJ SOLN
INTRAMUSCULAR | Status: AC
  Filled 2014-05-07: qty 2

## 2014-05-07 MED ORDER — PHENYLEPHRINE HCL 2.5 % OP SOLN
1.0000 [drp] | OPHTHALMIC | Status: AC | PRN
  Administered 2014-05-07 (×2): 1 [drp] via OPHTHALMIC

## 2014-05-07 MED ORDER — BRIMONIDINE TARTRATE 0.2 % OP SOLN
1.0000 [drp] | Freq: Two times a day (BID) | OPHTHALMIC | Status: DC
  Filled 2014-05-07: qty 5

## 2014-05-07 MED ORDER — PREDNISOLONE ACETATE 1 % OP SUSP
1.0000 [drp] | Freq: Four times a day (QID) | OPHTHALMIC | Status: DC
  Filled 2014-05-07: qty 1

## 2014-05-07 MED ORDER — HYDROCODONE-ACETAMINOPHEN 5-325 MG PO TABS
ORAL_TABLET | ORAL | Status: AC
  Filled 2014-05-07: qty 2

## 2014-05-07 MED ORDER — HYDROCODONE-ACETAMINOPHEN 5-325 MG PO TABS
1.0000 | ORAL_TABLET | ORAL | Status: DC | PRN
  Administered 2014-05-07: 2 via ORAL

## 2014-05-07 MED ORDER — ZOLPIDEM TARTRATE 5 MG PO TABS: 5.0000 mg | ORAL_TABLET | Freq: Every evening | ORAL | Status: DC | PRN

## 2014-05-07 MED ORDER — GENTAMICIN SULFATE 40 MG/ML IJ SOLN
INTRAMUSCULAR | Status: AC
  Filled 2014-05-07: qty 2

## 2014-05-07 MED ORDER — FENTANYL CITRATE 0.05 MG/ML IJ SOLN
INTRAMUSCULAR | Status: DC | PRN
  Administered 2014-05-07: 100 ug via INTRAVENOUS

## 2014-05-07 MED ORDER — EPHEDRINE SULFATE 50 MG/ML IJ SOLN
INTRAMUSCULAR | Status: DC | PRN
  Administered 2014-05-07: 5 mg via INTRAVENOUS
  Administered 2014-05-07: 10 mg via INTRAVENOUS

## 2014-05-07 MED ORDER — LATANOPROST 0.005 % OP SOLN
1.0000 [drp] | Freq: Every day | OPHTHALMIC | Status: DC
  Filled 2014-05-07: qty 2.5

## 2014-05-07 MED ORDER — GLYCOPYRROLATE 0.2 MG/ML IJ SOLN
INTRAMUSCULAR | Status: AC
  Filled 2014-05-07: qty 5

## 2014-05-07 MED ORDER — BSS PLUS IO SOLN
INTRAOCULAR | Status: AC
  Filled 2014-05-07: qty 500

## 2014-05-07 MED ORDER — ROCURONIUM BROMIDE 50 MG/5ML IV SOLN
INTRAVENOUS | Status: AC
  Filled 2014-05-07: qty 1

## 2014-05-07 MED ORDER — EPINEPHRINE HCL 1 MG/ML IJ SOLN
INTRAOCULAR | Status: DC | PRN
  Administered 2014-05-07: 16:00:00

## 2014-05-07 MED ORDER — PREDNISOLONE ACETATE 1 % OP SUSP
1.0000 [drp] | Freq: Four times a day (QID) | OPHTHALMIC | Status: DC
  Filled 2014-05-07: qty 5
  Filled 2014-05-07: qty 1

## 2014-05-07 MED ORDER — MORPHINE SULFATE 2 MG/ML IJ SOLN
1.0000 mg | INTRAMUSCULAR | Status: DC | PRN
  Administered 2014-05-07: 2 mg via INTRAVENOUS
  Filled 2014-05-07: qty 1

## 2014-05-07 MED ORDER — DEXAMETHASONE SODIUM PHOSPHATE 10 MG/ML IJ SOLN
INTRAMUSCULAR | Status: DC | PRN
  Administered 2014-05-07: 1 mL

## 2014-05-07 MED ORDER — MIDAZOLAM HCL 5 MG/5ML IJ SOLN
INTRAMUSCULAR | Status: DC | PRN
  Administered 2014-05-07: 1 mg via INTRAVENOUS

## 2014-05-07 MED ORDER — MEPERIDINE HCL 25 MG/ML IJ SOLN: 6.2500 mg | INTRAMUSCULAR | Status: DC | PRN

## 2014-05-07 MED ORDER — DEXAMETHASONE SODIUM PHOSPHATE 4 MG/ML IJ SOLN
INTRAMUSCULAR | Status: AC
  Filled 2014-05-07: qty 2

## 2014-05-07 MED ORDER — GATIFLOXACIN 0.5 % OP SOLN
1.0000 [drp] | Freq: Four times a day (QID) | OPHTHALMIC | Status: DC
  Filled 2014-05-07: qty 2.5

## 2014-05-07 MED ORDER — SODIUM HYALURONATE 10 MG/ML IO SOLN
INTRAOCULAR | Status: AC
  Filled 2014-05-07: qty 0.85

## 2014-05-07 MED ORDER — OXYCODONE HCL 5 MG/5ML PO SOLN: 5.0000 mg | Freq: Once | ORAL | Status: DC | PRN

## 2014-05-07 MED ORDER — FENTANYL CITRATE 0.05 MG/ML IJ SOLN
25.0000 ug | INTRAMUSCULAR | Status: DC | PRN
  Administered 2014-05-07: 50 ug via INTRAVENOUS

## 2014-05-07 MED ORDER — CEFAZOLIN SODIUM-DEXTROSE 2-3 GM-% IV SOLR
INTRAVENOUS | Status: DC | PRN
  Administered 2014-05-07: 2 g via INTRAVENOUS

## 2014-05-07 MED ORDER — EPINEPHRINE HCL 1 MG/ML IJ SOLN
INTRAMUSCULAR | Status: AC
  Filled 2014-05-07: qty 1

## 2014-05-07 MED ORDER — PHENYLEPHRINE 40 MCG/ML (10ML) SYRINGE FOR IV PUSH (FOR BLOOD PRESSURE SUPPORT)
PREFILLED_SYRINGE | INTRAVENOUS | Status: AC
  Filled 2014-05-07: qty 20

## 2014-05-07 MED ORDER — BUPIVACAINE HCL (PF) 0.75 % IJ SOLN
INTRAMUSCULAR | Status: AC
  Filled 2014-05-07: qty 10

## 2014-05-07 MED ORDER — OXYCODONE HCL 5 MG PO TABS: 5.0000 mg | ORAL_TABLET | Freq: Once | ORAL | Status: DC | PRN

## 2014-05-07 MED ORDER — PROPOFOL 10 MG/ML IV BOLUS
INTRAVENOUS | Status: DC | PRN
  Administered 2014-05-07: 200 mg via INTRAVENOUS

## 2014-05-07 MED ORDER — GLYCOPYRROLATE 0.2 MG/ML IJ SOLN
INTRAMUSCULAR | Status: DC | PRN
  Administered 2014-05-07: 0.2 mg via INTRAVENOUS
  Administered 2014-05-07: 0.4 mg via INTRAVENOUS

## 2014-05-07 MED ORDER — LACTATED RINGERS IV SOLN
INTRAVENOUS | Status: DC | PRN
  Administered 2014-05-07: 14:00:00 via INTRAVENOUS

## 2014-05-07 MED ORDER — CYCLOPENTOLATE HCL 1 % OP SOLN
1.0000 [drp] | OPHTHALMIC | Status: AC | PRN
  Administered 2014-05-07 (×2): 1 [drp] via OPHTHALMIC

## 2014-05-07 MED ORDER — BACITRACIN-POLYMYXIN B 500-10000 UNIT/GM OP OINT
TOPICAL_OINTMENT | OPHTHALMIC | Status: DC | PRN
  Administered 2014-05-07: 1 via OPHTHALMIC

## 2014-05-07 MED ORDER — DEXAMETHASONE SODIUM PHOSPHATE 10 MG/ML IJ SOLN
INTRAMUSCULAR | Status: AC
  Filled 2014-05-07: qty 1

## 2014-05-07 MED ORDER — ONDANSETRON HCL 4 MG/2ML IJ SOLN
INTRAMUSCULAR | Status: AC
  Filled 2014-05-07: qty 4

## 2014-05-07 MED ORDER — LATANOPROST 0.005 % OP SOLN
1.0000 [drp] | Freq: Every day | OPHTHALMIC | Status: DC
Start: 2014-05-07 — End: 2014-05-07
  Filled 2014-05-07: qty 2.5

## 2014-05-07 MED ORDER — ROCURONIUM BROMIDE 100 MG/10ML IV SOLN
INTRAVENOUS | Status: DC | PRN
  Administered 2014-05-07: 10 mg via INTRAVENOUS
  Administered 2014-05-07: 40 mg via INTRAVENOUS

## 2014-05-07 MED ORDER — PREDNISOLONE ACETATE 1 % OP SUSP
1.0000 [drp] | Freq: Four times a day (QID) | OPHTHALMIC | Status: DC
  Filled 2014-05-07 (×2): qty 5

## 2014-05-07 MED ORDER — PROPOFOL 10 MG/ML IV BOLUS
INTRAVENOUS | Status: AC
  Filled 2014-05-07: qty 20

## 2014-05-07 MED ORDER — TROPICAMIDE 1 % OP SOLN
1.0000 [drp] | OPHTHALMIC | Status: AC | PRN
  Administered 2014-05-07 (×2): 1 [drp] via OPHTHALMIC

## 2014-05-07 MED ORDER — ACETAZOLAMIDE SODIUM 500 MG IJ SOLR
500.0000 mg | Freq: Once | INTRAMUSCULAR | Status: AC
  Administered 2014-05-08: 500 mg via INTRAVENOUS
  Filled 2014-05-07: qty 500

## 2014-05-07 MED ORDER — ACETAMINOPHEN 325 MG PO TABS: 325.0000 mg | ORAL_TABLET | ORAL | Status: DC | PRN

## 2014-05-07 MED ORDER — SODIUM CHLORIDE 0.9 % IJ SOLN
INTRAMUSCULAR | Status: DC | PRN
  Administered 2014-05-07: 14:00:00

## 2014-05-07 MED ORDER — MAGNESIUM HYDROXIDE 400 MG/5ML PO SUSP: 15.0000 mL | Freq: Four times a day (QID) | ORAL | Status: DC | PRN

## 2014-05-07 MED ORDER — BACITRACIN-POLYMYXIN B 500-10000 UNIT/GM OP OINT
TOPICAL_OINTMENT | OPHTHALMIC | Status: AC
  Filled 2014-05-07: qty 3.5

## 2014-05-07 MED ORDER — SODIUM CHLORIDE 0.45 % IV SOLN
INTRAVENOUS | Status: DC
  Administered 2014-05-07: 17:00:00 via INTRAVENOUS

## 2014-05-07 MED ORDER — SODIUM CHLORIDE 0.9 % IJ SOLN
INTRAMUSCULAR | Status: AC
  Filled 2014-05-07: qty 10

## 2014-05-07 MED ORDER — BACITRACIN-POLYMYXIN B 500-10000 UNIT/GM OP OINT
1.0000 "application " | TOPICAL_OINTMENT | Freq: Four times a day (QID) | OPHTHALMIC | Status: DC
  Filled 2014-05-07: qty 3.5

## 2014-05-07 MED ORDER — SODIUM CHLORIDE 0.9 % IV SOLN
INTRAVENOUS | Status: DC
  Administered 2014-05-07: 11:00:00 via INTRAVENOUS

## 2014-05-07 MED ORDER — POLYMYXIN B SULFATE 500000 UNITS IJ SOLR
INTRAMUSCULAR | Status: AC
  Filled 2014-05-07: qty 1

## 2014-05-07 SURGICAL SUPPLY — 67 items
APPLICATOR DR MATTHEWS STRL (MISCELLANEOUS) ×12 IMPLANT
BLADE EYE CATARACT 19 1.4 BEAV (BLADE) IMPLANT
BLADE MVR KNIFE 19G (BLADE) IMPLANT
CANNULA ANT CHAM MAIN (OPHTHALMIC RELATED) IMPLANT
CANNULA DUAL BORE 23G (CANNULA) IMPLANT
CORDS BIPOLAR (ELECTRODE) IMPLANT
COTTONBALL LRG STERILE PKG (GAUZE/BANDAGES/DRESSINGS) ×6 IMPLANT
COVER MAYO STAND STRL (DRAPES) ×2 IMPLANT
COVER SURGICAL LIGHT HANDLE (MISCELLANEOUS) ×2 IMPLANT
DRAPE INCISE 51X51 W/FILM STRL (DRAPES) ×2 IMPLANT
DRAPE OPHTHALMIC 77X100 STRL (CUSTOM PROCEDURE TRAY) ×2 IMPLANT
ERASER HMR WETFIELD 23G BP (MISCELLANEOUS) IMPLANT
FILTER BLUE MILLIPORE (MISCELLANEOUS) IMPLANT
FILTER STRAW FLUID ASPIR (MISCELLANEOUS) IMPLANT
GAS AUTO FILL CONSTEL (OPHTHALMIC) ×2
GAS AUTO FILL CONSTELLATION (OPHTHALMIC) ×1 IMPLANT
GLOVE SS BIOGEL STRL SZ 6.5 (GLOVE) ×1 IMPLANT
GLOVE SS BIOGEL STRL SZ 7 (GLOVE) ×1 IMPLANT
GLOVE SUPERSENSE BIOGEL SZ 6.5 (GLOVE) ×1
GLOVE SUPERSENSE BIOGEL SZ 7 (GLOVE) ×1
GLOVE SURG 8.5 LATEX PF (GLOVE) ×2 IMPLANT
GLOVE SURG SS PI 6.5 STRL IVOR (GLOVE) ×2 IMPLANT
GOWN STRL REUS W/ TWL LRG LVL3 (GOWN DISPOSABLE) ×3 IMPLANT
GOWN STRL REUS W/TWL LRG LVL3 (GOWN DISPOSABLE) ×3
IMPL SILICONE (Ophthalmic Related) ×1 IMPLANT
IMPLANT SILICONE (Ophthalmic Related) ×3 IMPLANT
KIT BASIN OR (CUSTOM PROCEDURE TRAY) ×2 IMPLANT
KIT PERFLUORON PROCEDURE 5ML (MISCELLANEOUS) IMPLANT
KIT ROOM TURNOVER OR (KITS) ×2 IMPLANT
KNIFE CRESCENT 1.75 EDGEAHEAD (BLADE) IMPLANT
KNIFE GRIESHABER SHARP 2.5MM (MISCELLANEOUS) ×8 IMPLANT
MICROPICK 25G (MISCELLANEOUS)
NEEDLE 18GX1X1/2 (RX/OR ONLY) (NEEDLE) ×2 IMPLANT
NEEDLE 25GX 5/8IN NON SAFETY (NEEDLE) IMPLANT
NEEDLE 27GAX1X1/2 (NEEDLE) ×2 IMPLANT
NEEDLE HYPO 30X.5 LL (NEEDLE) ×4 IMPLANT
NS IRRIG 1000ML POUR BTL (IV SOLUTION) ×2 IMPLANT
PACK VITRECTOMY CUSTOM (CUSTOM PROCEDURE TRAY) ×2 IMPLANT
PAD ARMBOARD 7.5X6 YLW CONV (MISCELLANEOUS) ×4 IMPLANT
PAK PIK VITRECTOMY CVS 25GA (OPHTHALMIC) ×2 IMPLANT
PIC ILLUMINATED 25G (OPHTHALMIC)
PICK MICROPICK 25G (MISCELLANEOUS) IMPLANT
PIK ILLUMINATED 25G (OPHTHALMIC) IMPLANT
REPL STRA BRUSH NEEDLE (NEEDLE) IMPLANT
RESERVOIR BACK FLUSH (MISCELLANEOUS) IMPLANT
ROLLS DENTAL (MISCELLANEOUS) ×4 IMPLANT
SLEEVE SCLERAL TYPE 270 (Ophthalmic Related) ×2 IMPLANT
SPEAR EYE SURG WECK-CEL (MISCELLANEOUS) ×8 IMPLANT
SPONGE SURGIFOAM ABS GEL 12-7 (HEMOSTASIS) ×2 IMPLANT
STOPCOCK 4 WAY LG BORE MALE ST (IV SETS) IMPLANT
SUT CHROMIC 7 0 TG140 8 (SUTURE) ×2 IMPLANT
SUT ETHILON 9 0 TG140 8 (SUTURE) IMPLANT
SUT MERSILENE 4 0 RV 2 (SUTURE) ×6 IMPLANT
SUT SILK 2 0 (SUTURE) ×1
SUT SILK 2-0 18XBRD TIE 12 (SUTURE) ×1 IMPLANT
SUT SILK 4 0 RB 1 (SUTURE) ×2 IMPLANT
SYR 20CC LL (SYRINGE) ×2 IMPLANT
SYR 50ML LL SCALE MARK (SYRINGE) ×2 IMPLANT
SYR 5ML LL (SYRINGE) ×2 IMPLANT
SYR BULB 3OZ (MISCELLANEOUS) ×2 IMPLANT
SYR TB 1ML LUER SLIP (SYRINGE) ×2 IMPLANT
SYRINGE 10CC LL (SYRINGE) ×2 IMPLANT
TIRE RTNL 2.5XGRV CNCV 9X (Ophthalmic Related) ×1 IMPLANT
TOWEL OR 17X24 6PK STRL BLUE (TOWEL DISPOSABLE) ×6 IMPLANT
TUBING ART PRESS 12 MALE/MALE (MISCELLANEOUS) IMPLANT
WATER STERILE IRR 1000ML POUR (IV SOLUTION) ×2 IMPLANT
WIPE INSTRUMENT VISIWIPE 73X73 (MISCELLANEOUS) ×2 IMPLANT

## 2014-05-07 NOTE — Anesthesia Procedure Notes (Signed)
Procedure Name: Intubation Date/Time: 05/07/2014 1:59 PM Performed by: Shirlyn Goltz Pre-anesthesia Checklist: Patient identified, Emergency Drugs available, Suction available and Patient being monitored Patient Re-evaluated:Patient Re-evaluated prior to inductionOxygen Delivery Method: Circle system utilized Preoxygenation: Pre-oxygenation with 100% oxygen Intubation Type: IV induction Ventilation: Mask ventilation without difficulty and Oral airway inserted - appropriate to patient size Laryngoscope size: elective glidescope due to previous difficult intubation/ventilation comments. Grade View: Grade I Tube type: Oral Tube size: 7.0 mm Number of attempts: 1 Airway Equipment and Method: Video-laryngoscopy and Stylet Placement Confirmation: ETT inserted through vocal cords under direct vision,  positive ETCO2 and breath sounds checked- equal and bilateral Secured at: 22 cm Tube secured with: Tape Dental Injury: Teeth and Oropharynx as per pre-operative assessment

## 2014-05-07 NOTE — Brief Op Note (Signed)
Brief Operative note   Preoperative diagnosis:  retinal detachment right eye Postoperative diagnosis  Rhegmatogenous retinal detachment with macular hole right eye  Procedures: Repair of complex retinal detachment with pars plana vitrectomy, scleral buckle, gas fluid exchange, laser right eye  Surgeon:  Hayden Pedro, MD...  Assistant:  Deatra Ina SA    Anesthesia: General  Specimen: none  Estimated blood loss:  1cc  Complications: none  Patient sent to PACU in good condition  Composed by Hayden Pedro MD  Dictation number: [id number]

## 2014-05-07 NOTE — Transfer of Care (Signed)
Immediate Anesthesia Transfer of Care Note  Patient: Robert [name]  Procedure(s) Performed: Procedure(s): SCLERAL BUCKLE (Right) INSERTION OF GAS (C3F8) (Right) LASER PHOTO ABLATION (Right) PARS PLANA VITRECTOMY WITH 25 GAUGE (Right) REPAIR OF COMPLEX TRACTION RETINAL DETACHMENT (Right) GAS/FLUID EXCHANGE (Right)  Patient Location: PACU  Anesthesia Type:General  Level of Consciousness: patient cooperative and responds to stimulation  Airway & Oxygen Therapy: Patient Spontanous Breathing and Patient connected to nasal cannula oxygen  Post-op Assessment: Report given to PACU RN, Post -op Vital signs reviewed and stable and Patient moving all extremities X 4  Post vital signs: Reviewed and stable  Complications: No apparent anesthesia complications

## 2014-05-07 NOTE — H&P (Signed)
I examined the patient today and there is no change in the medical status 

## 2014-05-07 NOTE — Anesthesia Preprocedure Evaluation (Addendum)
Anesthesia Evaluation  Patient identified by MRN, date of birth, ID band Patient awake    Reviewed: Allergy & Precautions, H&P , NPO status , Patient's Chart, lab work & pertinent test results  History of Anesthesia Complications (+) PONV and history of anesthetic complications  Airway Mallampati: II  TM Distance: >3 FB Neck ROM: Full    Dental  (+) Dental Advisory Given, Poor Dentition   Pulmonary COPDCurrent Smoker,  + rhonchi         Cardiovascular - anginanegative cardio ROS  Rhythm:Regular Rate:Normal     Neuro/Psych H/o transverse myelitis    GI/Hepatic negative GI ROS, Neg liver ROS,   Endo/Other  negative endocrine ROS  Renal/GU negative Renal ROS     Musculoskeletal   Abdominal   Peds  Hematology negative hematology ROS (+)   Anesthesia Other Findings   Reproductive/Obstetrics                            Anesthesia Physical Anesthesia Plan  ASA: III  Anesthesia Plan: General   Post-op Pain Management:    Induction: Intravenous  Airway Management Planned: Oral ETT and Video Laryngoscope Planned  Additional Equipment:   Intra-op Plan:   Post-operative Plan: Extubation in OR  Informed Consent: I have reviewed the patients History and Physical, chart, labs and discussed the procedure including the risks, benefits and alternatives for the proposed anesthesia with the patient or authorized representative who has indicated his/her understanding and acceptance.   Dental advisory given  Plan Discussed with: Surgeon and CRNA  Anesthesia Plan Comments: (Plan routine monitors, GETA with VideoGlide intubation )        Anesthesia Quick Evaluation

## 2014-05-07 NOTE — Anesthesia Postprocedure Evaluation (Signed)
  Anesthesia Post-op Note  Patient: Robert [name]  Procedure(s) Performed: Procedure(s): SCLERAL BUCKLE (Right) INSERTION OF GAS (C3F8) (Right) LASER PHOTO ABLATION (Right) PARS PLANA VITRECTOMY WITH 25 GAUGE (Right) REPAIR OF COMPLEX TRACTION RETINAL DETACHMENT (Right) GAS/FLUID EXCHANGE (Right)  Patient Location: PACU  Anesthesia Type:General  Level of Consciousness: awake, alert  and oriented  Airway and Oxygen Therapy: Patient Spontanous Breathing  Post-op Pain: none  Post-op Assessment: Post-op Vital signs reviewed  Post-op Vital Signs: Reviewed  Last Vitals:  Filed Vitals:   05/07/14 1711  BP: 157/72  Pulse: 74  Temp: 36.6 C  Resp: 16    Complications: No apparent anesthesia complications

## 2014-05-08 ENCOUNTER — Encounter (HOSPITAL_COMMUNITY): Payer: Self-pay | Admitting: Ophthalmology

## 2014-05-08 DIAGNOSIS — H33001 Unspecified retinal detachment with retinal break, right eye: Secondary | ICD-10-CM | POA: Diagnosis not present

## 2014-05-08 MED ORDER — PREDNISOLONE ACETATE 1 % OP SUSP: 1.0000 [drp] | Freq: Four times a day (QID) | OPHTHALMIC | Status: DC

## 2014-05-08 MED ORDER — ACETAZOLAMIDE ER 500 MG PO CP12: 500.0000 mg | ORAL_CAPSULE | Freq: Two times a day (BID) | ORAL | Status: DC

## 2014-05-08 MED ORDER — GATIFLOXACIN 0.5 % OP SOLN: 1.0000 [drp] | Freq: Four times a day (QID) | OPHTHALMIC | Status: DC

## 2014-05-08 MED ORDER — LATANOPROST 0.005 % OP SOLN: 1.0000 [drp] | Freq: Every day | OPHTHALMIC | Status: DC

## 2014-05-08 MED ORDER — HYDROCODONE-ACETAMINOPHEN 5-325 MG PO TABS: 1.0000 | ORAL_TABLET | ORAL | Status: DC | PRN

## 2014-05-08 MED ORDER — BACITRACIN-POLYMYXIN B 500-10000 UNIT/GM OP OINT: 1.0000 "application " | TOPICAL_OINTMENT | Freq: Three times a day (TID) | OPHTHALMIC | Status: DC

## 2014-05-08 MED ORDER — BRIMONIDINE TARTRATE 0.2 % OP SOLN: 1.0000 [drp] | Freq: Two times a day (BID) | OPHTHALMIC | Status: DC

## 2014-05-08 NOTE — Op Note (Signed)
NAME:  GASTON, DASE NO.:  [id number]  MEDICAL RECORD NO.:  [id number]  LOCATION:  6N06C                        [facility]  PHYSICIAN:  Chrystie Nose. Zigmund Daniel, M.D. DATE OF BIRTH:  09-Oct-1940  DATE OF PROCEDURE:  05/07/2014 DATE OF DISCHARGE:                              OPERATIVE REPORT   ADMISSION DIAGNOSIS:  Rhegmatogenous retinal detachment in the right eye.  PROCEDURES:  Repair of complex retinal detachment with scleral buckle, pars plana vitrectomy, retinal photocoagulation, gas-fluid exchange in the right eye.  SURGEON:  Chrystie Nose. Zigmund Daniel, M.D.  ASSISTANT:  Deatra Ina, SA.  ANESTHESIA:  General.  DESCRIPTION OF PROCEDURE:  Usual prep and drape, 360 degree limbal peritomy isolation of 4 rectus muscles on 2-0 silk, scleral dissection for 360 degrees to admit a #279 intrascleral implant.  Diathermy placed in the bed.  279 implant was placed around the globe with 1-mm trim from the posterior edge between 3 o'clock and 9 o'clock.  A 240 band was placed around the eye with 270 sleeve at 2 o'clock.  The joint of the 279 band was placed at 2 o'clock.  Perforation site chosen at 7 o'clock. A large amount of clear colorless subretinal fluid came forth in a controlled fashion with the external drainage.  These scleral flaps were closed as the fluid egressed.  The band was adjusted and trimmed.  The buckle was adjusted and trimmed.  The indirect ophthalmoscopy showed the retina to be lying nicely in this place on the scleral buckle with some subretinal fluid remaining and vitreous fluid remaining.  The decision was made for vitrectomy.  25-gauge trocars were placed at 8, 10, and 2 o'clock.  Contact lens ring was anchored into place at 6 and 12 o'clock. Provisc was placed on the corneal surface and the double wide contact lens was placed.  Pars plana vitrectomy was begun and gas-fluid exchange was performed quickly.  Vitreous fluid was removed with the  vitreous cutter and with the silicone tip suction line.  Sufficient time was allowed for additional fluid to track down the walls of the eye and collect.  Additional fluid was removed with silicone tip suction line. C3F8 in a 14% concentration was exchanged for intravitreal gas.  Minimal subretinal fluid was remaining and no vitreous fluid was remaining.  The instruments were removed from the eye and the 25-gauge trocars were removed from the eye.  The buckle was adjusted again and trimmed.  The band was adjusted again and trimmed.  The scleral sutures were knotted and the free ends removed.  The indirect ophthalmoscope laser was moved into place.  950 burns were placed around the retinal periphery.  The power of 700 mW, 1000 microns each and 0.1 seconds each.  The conjunctiva was reposited with 7-0 chromic suture.  Polymyxin and gentamicin were irrigated into tenon space.  Decadron 10 mg was injected into the lower subconjunctival space.  Polysporin ophthalmic ointment patch and shield were placed.  The patient was awakened and taken to recovery in satisfactory condition.  COMPLICATIONS:  None.  DURATION:  2 hours.     Chrystie Nose. Zigmund Daniel, M.D.     JDM/MEDQ  D:  05/07/2014  T:  05/08/2014  Job:  [id number] 

## 2014-05-08 NOTE — Progress Notes (Signed)
0653 Patient given discharge instructions and wife and patient verbalize understanding of in instructions. Patient via w/c to car

## 2014-05-08 NOTE — Discharge Summary (Signed)
Discharge summary not needed on [facility] patients per medical records. 

## 2014-05-08 NOTE — Plan of Care (Signed)
Problem: Consults Goal: Skin Care Protocol Initiated - if Braden Score 18 or less If consults are not indicated, leave blank or document N/A Outcome: Not Applicable Date Met:  84/03/97 Goal: Nutrition Consult-if indicated Outcome: Not Applicable Date Met:  95/36/92 Goal: Diabetes Guidelines if Diabetic/Glucose > 140 If diabetic or lab glucose is > 140 mg/dl - Initiate Diabetes/Hyperglycemia Guidelines & Document Interventions  Outcome: Not Applicable Date Met:  23/00/97

## 2014-05-08 NOTE — Plan of Care (Signed)
Problem: Discharge Progression Outcomes Goal: Discharge plan in place and appropriate Outcome: Completed/Met Date Met:  05/08/14 Goal: Pain controlled with appropriate interventions Outcome: Completed/Met Date Met:  05/08/14 Goal: Hemodynamically stable Outcome: Completed/Met Date Met:  05/08/14 Goal: Tolerating diet Outcome: Completed/Met Date Met:  05/08/14 Goal: Activity appropriate for discharge plan Outcome: Completed/Met Date Met:  05/08/14

## 2014-05-08 NOTE — Progress Notes (Signed)
05/08/2014, 6:26 AM  Mental Status:  Awake, Alert, Oriented  Anterior segment: Cornea  Clear    Anterior Chamber Clear    Lens:    IOL  Intra Ocular Pressure 35 mmHg with Tonopen  Vitreous: Clear 100%gas bubble   Retina:  Attached Good laser reaction   Impression: Excellent result Retina attached   Final Diagnosis: Principal Problem:   Rhegmatogenous retinal detachment of right eye Macular hole right eye  Plan: start post operative eye drops.   Add alphagan, xalatan and diamox.  Discharge to home. Return to office in 24 hours.  Give post operative instructions  Robert [name] 05/08/2014, 6:26 AM

## 2014-05-09 ENCOUNTER — Encounter (INDEPENDENT_AMBULATORY_CARE_PROVIDER_SITE_OTHER): Payer: Medicare Other | Admitting: Ophthalmology

## 2014-05-09 DIAGNOSIS — H338 Other retinal detachments: Secondary | ICD-10-CM

## 2014-05-13 ENCOUNTER — Inpatient Hospital Stay (INDEPENDENT_AMBULATORY_CARE_PROVIDER_SITE_OTHER): Payer: Medicare Other | Admitting: Ophthalmology

## 2014-05-13 DIAGNOSIS — H338 Other retinal detachments: Secondary | ICD-10-CM

## 2014-05-24 ENCOUNTER — Encounter (INDEPENDENT_AMBULATORY_CARE_PROVIDER_SITE_OTHER): Payer: Medicare Other | Admitting: Ophthalmology

## 2014-05-24 DIAGNOSIS — H338 Other retinal detachments: Secondary | ICD-10-CM

## 2014-06-14 ENCOUNTER — Encounter (INDEPENDENT_AMBULATORY_CARE_PROVIDER_SITE_OTHER): Payer: Medicare Other | Admitting: Ophthalmology

## 2014-06-14 DIAGNOSIS — H33051 Total retinal detachment, right eye: Secondary | ICD-10-CM

## 2014-08-26 ENCOUNTER — Encounter (INDEPENDENT_AMBULATORY_CARE_PROVIDER_SITE_OTHER): Payer: Medicare Other | Admitting: Ophthalmology

## 2014-08-26 DIAGNOSIS — H35343 Macular cyst, hole, or pseudohole, bilateral: Secondary | ICD-10-CM | POA: Diagnosis not present

## 2014-08-26 DIAGNOSIS — H338 Other retinal detachments: Secondary | ICD-10-CM | POA: Diagnosis not present

## 2015-02-24 ENCOUNTER — Ambulatory Visit (INDEPENDENT_AMBULATORY_CARE_PROVIDER_SITE_OTHER): Payer: Medicare Other | Admitting: Ophthalmology

## 2015-02-24 DIAGNOSIS — H35371 Puckering of macula, right eye: Secondary | ICD-10-CM

## 2015-02-24 DIAGNOSIS — H338 Other retinal detachments: Secondary | ICD-10-CM | POA: Diagnosis not present

## 2015-02-24 DIAGNOSIS — H35343 Macular cyst, hole, or pseudohole, bilateral: Secondary | ICD-10-CM | POA: Diagnosis not present

## 2016-02-25 ENCOUNTER — Ambulatory Visit (INDEPENDENT_AMBULATORY_CARE_PROVIDER_SITE_OTHER): Payer: Medicare Other | Admitting: Ophthalmology

## 2016-02-25 DIAGNOSIS — H35371 Puckering of macula, right eye: Secondary | ICD-10-CM

## 2016-02-25 DIAGNOSIS — H338 Other retinal detachments: Secondary | ICD-10-CM

## 2016-02-25 DIAGNOSIS — H35343 Macular cyst, hole, or pseudohole, bilateral: Secondary | ICD-10-CM | POA: Diagnosis not present

## 2016-02-25 DIAGNOSIS — H33301 Unspecified retinal break, right eye: Secondary | ICD-10-CM | POA: Diagnosis not present

## 2016-10-26 DIAGNOSIS — H524 Presbyopia: Secondary | ICD-10-CM | POA: Diagnosis not present

## 2016-10-26 DIAGNOSIS — H35343 Macular cyst, hole, or pseudohole, bilateral: Secondary | ICD-10-CM | POA: Diagnosis not present

## 2016-10-26 DIAGNOSIS — H26491 Other secondary cataract, right eye: Secondary | ICD-10-CM | POA: Diagnosis not present

## 2017-02-24 ENCOUNTER — Ambulatory Visit (INDEPENDENT_AMBULATORY_CARE_PROVIDER_SITE_OTHER): Payer: Medicare Other | Admitting: Ophthalmology

## 2017-02-24 DIAGNOSIS — H35343 Macular cyst, hole, or pseudohole, bilateral: Secondary | ICD-10-CM

## 2017-02-24 DIAGNOSIS — H338 Other retinal detachments: Secondary | ICD-10-CM

## 2017-07-05 DIAGNOSIS — Z95810 Presence of automatic (implantable) cardiac defibrillator: Secondary | ICD-10-CM

## 2017-07-05 HISTORY — DX: Presence of automatic (implantable) cardiac defibrillator: Z95.810

## 2017-11-01 DIAGNOSIS — H524 Presbyopia: Secondary | ICD-10-CM | POA: Diagnosis not present

## 2017-11-01 DIAGNOSIS — H35343 Macular cyst, hole, or pseudohole, bilateral: Secondary | ICD-10-CM | POA: Diagnosis not present

## 2017-11-01 DIAGNOSIS — H26491 Other secondary cataract, right eye: Secondary | ICD-10-CM | POA: Diagnosis not present

## 2017-12-18 DIAGNOSIS — I2510 Atherosclerotic heart disease of native coronary artery without angina pectoris: Secondary | ICD-10-CM | POA: Diagnosis present

## 2017-12-18 DIAGNOSIS — J811 Chronic pulmonary edema: Secondary | ICD-10-CM | POA: Diagnosis not present

## 2017-12-18 DIAGNOSIS — I10 Essential (primary) hypertension: Secondary | ICD-10-CM | POA: Diagnosis not present

## 2017-12-18 DIAGNOSIS — J810 Acute pulmonary edema: Secondary | ICD-10-CM | POA: Diagnosis not present

## 2017-12-18 DIAGNOSIS — I214 Non-ST elevation (NSTEMI) myocardial infarction: Secondary | ICD-10-CM | POA: Diagnosis not present

## 2017-12-18 DIAGNOSIS — I517 Cardiomegaly: Secondary | ICD-10-CM | POA: Diagnosis not present

## 2017-12-18 DIAGNOSIS — R0902 Hypoxemia: Secondary | ICD-10-CM | POA: Diagnosis not present

## 2017-12-18 DIAGNOSIS — R0602 Shortness of breath: Secondary | ICD-10-CM | POA: Diagnosis not present

## 2017-12-18 DIAGNOSIS — I21A1 Myocardial infarction type 2: Secondary | ICD-10-CM | POA: Diagnosis present

## 2017-12-18 DIAGNOSIS — I493 Ventricular premature depolarization: Secondary | ICD-10-CM | POA: Diagnosis not present

## 2017-12-18 DIAGNOSIS — R739 Hyperglycemia, unspecified: Secondary | ICD-10-CM | POA: Diagnosis present

## 2017-12-18 DIAGNOSIS — I5041 Acute combined systolic (congestive) and diastolic (congestive) heart failure: Secondary | ICD-10-CM | POA: Diagnosis present

## 2017-12-18 DIAGNOSIS — J9602 Acute respiratory failure with hypercapnia: Secondary | ICD-10-CM | POA: Diagnosis present

## 2017-12-18 DIAGNOSIS — I110 Hypertensive heart disease with heart failure: Secondary | ICD-10-CM | POA: Diagnosis not present

## 2017-12-18 DIAGNOSIS — Z8249 Family history of ischemic heart disease and other diseases of the circulatory system: Secondary | ICD-10-CM | POA: Diagnosis not present

## 2017-12-18 DIAGNOSIS — I5021 Acute systolic (congestive) heart failure: Secondary | ICD-10-CM | POA: Diagnosis not present

## 2017-12-18 DIAGNOSIS — E872 Acidosis: Secondary | ICD-10-CM | POA: Diagnosis present

## 2017-12-18 DIAGNOSIS — I161 Hypertensive emergency: Secondary | ICD-10-CM | POA: Diagnosis not present

## 2017-12-18 DIAGNOSIS — I429 Cardiomyopathy, unspecified: Secondary | ICD-10-CM | POA: Diagnosis not present

## 2017-12-18 DIAGNOSIS — F17210 Nicotine dependence, cigarettes, uncomplicated: Secondary | ICD-10-CM | POA: Diagnosis present

## 2017-12-18 DIAGNOSIS — F17200 Nicotine dependence, unspecified, uncomplicated: Secondary | ICD-10-CM | POA: Diagnosis not present

## 2017-12-18 DIAGNOSIS — N289 Disorder of kidney and ureter, unspecified: Secondary | ICD-10-CM | POA: Diagnosis not present

## 2017-12-18 DIAGNOSIS — J90 Pleural effusion, not elsewhere classified: Secondary | ICD-10-CM | POA: Diagnosis not present

## 2017-12-18 DIAGNOSIS — I447 Left bundle-branch block, unspecified: Secondary | ICD-10-CM | POA: Diagnosis present

## 2017-12-18 DIAGNOSIS — R0989 Other specified symptoms and signs involving the circulatory and respiratory systems: Secondary | ICD-10-CM | POA: Diagnosis not present

## 2017-12-18 DIAGNOSIS — I255 Ischemic cardiomyopathy: Secondary | ICD-10-CM | POA: Diagnosis present

## 2017-12-18 DIAGNOSIS — R9431 Abnormal electrocardiogram [ECG] [EKG]: Secondary | ICD-10-CM | POA: Diagnosis not present

## 2017-12-18 DIAGNOSIS — N179 Acute kidney failure, unspecified: Secondary | ICD-10-CM | POA: Diagnosis not present

## 2017-12-18 DIAGNOSIS — J9601 Acute respiratory failure with hypoxia: Secondary | ICD-10-CM | POA: Diagnosis present

## 2017-12-18 DIAGNOSIS — J9690 Respiratory failure, unspecified, unspecified whether with hypoxia or hypercapnia: Secondary | ICD-10-CM | POA: Diagnosis not present

## 2017-12-26 ENCOUNTER — Ambulatory Visit (INDEPENDENT_AMBULATORY_CARE_PROVIDER_SITE_OTHER): Payer: Medicare Other | Admitting: Interventional Cardiology

## 2017-12-26 ENCOUNTER — Encounter: Payer: Self-pay | Admitting: Interventional Cardiology

## 2017-12-26 ENCOUNTER — Encounter (INDEPENDENT_AMBULATORY_CARE_PROVIDER_SITE_OTHER): Payer: Self-pay

## 2017-12-26 VITALS — BP 128/60 | HR 70 | Ht 69.5 in | Wt 199.0 lb

## 2017-12-26 DIAGNOSIS — I2510 Atherosclerotic heart disease of native coronary artery without angina pectoris: Secondary | ICD-10-CM | POA: Diagnosis not present

## 2017-12-26 DIAGNOSIS — I255 Ischemic cardiomyopathy: Secondary | ICD-10-CM | POA: Diagnosis not present

## 2017-12-26 DIAGNOSIS — I5023 Acute on chronic systolic (congestive) heart failure: Secondary | ICD-10-CM | POA: Diagnosis not present

## 2017-12-26 DIAGNOSIS — I25118 Atherosclerotic heart disease of native coronary artery with other forms of angina pectoris: Secondary | ICD-10-CM | POA: Diagnosis not present

## 2017-12-26 DIAGNOSIS — R0602 Shortness of breath: Secondary | ICD-10-CM

## 2017-12-26 NOTE — Progress Notes (Signed)
Cardiology Office Note   Date:  12/26/2017   ID:  Robert [name], DOB 01/02/1941, MRN [id number]  PCP:  System, Pcp Not In    No chief complaint on file.  HEart failure  Wt Readings from Last 3 Encounters:  12/26/17 199 lb (90.3 kg)  05/07/14 194 lb (88 kg)  04/02/14 190 lb (86.2 kg)       History of Present Illness: Robert [name] is a 77 y.o. male who is being seen today for the evaluation of CAD at the request of No ref. provider found.  He had flash pulmonary edema several weeks ago while out of town in [place].  He went to the local hospital.  He had CTOs of the LAD and RCA and EF 25%.  Plan was for cardiac MRI and possible CABG.  He was discharged with a LifeVest.  Started on meds CHF.   His father and uncle had early CAD.    Denies : Chest pain. Dizziness. Leg edema. Nitroglycerin use. Orthopnea. Palpitations. Paroxysmal nocturnal dyspnea. Shortness of breath. Syncope.   Tolerating his meds.     Past Medical History:  Diagnosis Date  . Childhood asthma   . Hay fever   . Macular hole of left eye 12/23/2011  . Macular hole, right eye 03/05/2014  . Neuromuscular disorder (HCC)    numbness in feet  . PONV (postoperative nausea and vomiting)   . 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 Sinus headache    "hay fever season; not that often"  . Transverse myelitis ([place]) 2009   was treated by Dr Erling Cruz  . Umbilical hernia     Past Surgical History:  Procedure Laterality Date  . [place] VITRECTOMY WITH 20 GAUGE MVR PORT FOR MACULAR HOLE Left 03/08/2013   Procedure: 25 GAUGE PARS PLANA VITRECTOMY WITH 20 GAUGE MVR PORT FOR MACULAR HOLE;  Surgeon: Hayden Pedro, MD;  Location: [place];  Service: Ophthalmology;  Laterality: Left;  . 25 GAUGE PARS PLANA VITRECTOMY WITH 20 GAUGE MVR PORT FOR MACULAR HOLE Right 04/02/2014   Procedure: 20-25 GAUGE PARS PLANA VITRECTOMY ; MEMBRANE PEEL; HEADSCOPE LASER; SERUM PATCH; GAS/FLUID  EXCHANGE ;C3F8;  Surgeon: Hayden Pedro, MD;  Location: [place];  Service: Ophthalmology;  Laterality: Right;  . CATARACT EXTRACTION W/ INTRAOCULAR LENS  IMPLANT, BILATERAL Bilateral 2014  . EYE SURGERY    . GAS INSERTION Left 03/08/2013   Procedure: INSERTION OF GAS;  Surgeon: Hayden Pedro, MD;  Location: [place];  Service: Ophthalmology;  Laterality: Left;  C3F8  . GAS INSERTION Right 05/07/2014   Procedure: INSERTION OF GAS (C3F8);  Surgeon: Hayden Pedro, MD;  Location: [place];  Service: Ophthalmology;  Laterality: Right;  . GAS/FLUID EXCHANGE Right 05/07/2014   Procedure: GAS/FLUID EXCHANGE;  Surgeon: Hayden Pedro, MD;  Location: [place];  Service: Ophthalmology;  Laterality: Right;  . LASER PHOTO ABLATION Right 05/07/2014   Procedure: LASER PHOTO ABLATION;  Surgeon: Hayden Pedro, MD;  Location: [place];  Service: Ophthalmology;  Laterality: Right;  . PARS PLANA VITRECTOMY  01/18/2012   Procedure: PARS PLANA VITRECTOMY WITH 25 GAUGE;  Surgeon: Hayden Pedro, MD;  Location: [place];  Service: Ophthalmology;  Laterality: Left;  25g. ppv for macular hole ; Laser treatment;Serum patch and Gas Injection(C3F8)  . PARS PLANA VITRECTOMY Right 05/07/2014   Procedure: PARS PLANA VITRECTOMY WITH 25 GAUGE;  Surgeon: Hayden Pedro, MD;  Location: [place];  Service: Ophthalmology;  Laterality: Right;  . [place] VITRECTOMY W/ REPAIR OF MACULAR HOLE Right 04/02/2014  . PHOTOCOAGULATION WITH LASER Left 03/08/2013   Procedure: PHOTOCOAGULATION WITH LASER;  Surgeon: Hayden Pedro, MD;  Location: [place];  Service: Ophthalmology;  Laterality: Left;  Headscope   . REPAIR OF COMPLEX TRACTION RETINAL DETACHMENT Right 05/07/2014   Procedure: REPAIR OF COMPLEX TRACTION RETINAL DETACHMENT;  Surgeon: Hayden Pedro, MD;  Location: [place];  Service: Ophthalmology;  Laterality: Right;  . SCLERAL BUCKLE Right 05/07/2014   Procedure: SCLERAL BUCKLE;  Surgeon: Hayden Pedro, MD;  Location: [place];  Service: Ophthalmology;   Laterality: Right;     Current Outpatient Medications  Medication Sig Dispense Refill  . aspirin 81 MG chewable tablet Chew 81 mg by mouth daily.  0  . atorvastatin (LIPITOR) 40 MG tablet Take 40 mg by mouth daily.    . carvedilol (COREG) 6.25 MG tablet Take 6.25 mg by mouth 2 (two) times daily.    . furosemide (LASIX) 40 MG tablet Take 40 mg by mouth daily.    . isosorbide mononitrate (IMDUR) 30 MG 24 hr tablet Take 30 mg by mouth daily.    Marland Kitchen lisinopril (PRINIVIL,ZESTRIL) 2.5 MG tablet Take 2.5 mg by mouth daily.    Marland Kitchen acetaZOLAMIDE (DIAMOX SEQUELS) 500 MG capsule Take 1 capsule (500 mg total) by mouth 2 (two) times daily. (Patient not taking: Reported on 12/26/2017) 60 capsule 1   No current facility-administered medications for this visit.     Allergies:   Typhoid vaccines; Codeine; and Ivp dye [iodinated diagnostic agents]    Social History:  The patient  reports that he quit smoking 8 days ago. His smoking use included cigarettes. He has a 104.00 pack-year smoking history. He has never used smokeless tobacco. He reports that he does not drink alcohol or use drugs.   Family History:  The patient's family history includes Cancer in his mother; Heart attack in his father; Heart disease in his mother.    ROS:  Please see the history of present illness.   Otherwise, review of systems are positive for breathing improved.   All other systems are reviewed and negative.    PHYSICAL EXAM: VS:  BP 128/60   Pulse 70   Ht 5' 9.5" (1.765 m)   Wt 199 lb (90.3 kg)   SpO2 96%   BMI 28.97 kg/m  , BMI Body mass index is 28.97 kg/m. GEN: Well nourished, well developed, in no acute distress , life vest in place HEENT: normal  Neck: no JVD, carotid bruits, or masses Cardiac: RRR; no murmurs, rubs, or gallops,no edema  Respiratory:  clear to auscultation bilaterally, normal work of breathing GI: soft, nontender, nondistended, + BS, obese MS: no deformity or atrophy  Skin: warm and dry, no  rash Neuro:  Strength and sensation are intact Psych: euthymic mood, full affect   EKG:   The ekg ordered today demonstrates NSR, PVC, LVH   Recent Labs: No results found for requested labs within last 8760 hours.   Lipid Panel No results found for: CHOL, TRIG, HDL, CHOLHDL, VLDL, LDLCALC, LDLDIRECT   Other studies Reviewed: Additional studies/ records that were reviewed today with results demonstrating: Catheter results from [facility] reviewed.  Patient has a CTO of the LAD and RCA.  There is severe diagonal disease as well.  Severely decreased left ventricular ejection fraction.  Nuclear study showed only mild reversible ischemia and predominantly fixed defects.   ASSESSMENT AND PLAN:  1. Acute systolic heart failure: Likely from ischemic cardiomyopathy.  Cath results as noted above.  Need cardiac MRI to assess viability.  If myocardium is viable, would refer for bypass surgery if not, he will need EP referral for defibrillator and continued heart failure management.  Continue ACE inhibitor, beta-blocker, diuretic 2. CAD: No angina.  Continue high-dose statin. 3. Shortness of breath: Improved.  .      Current medicines are reviewed at length with the patient today.  The patient concerns regarding his medicines were addressed.  The following changes have been made:  No change  Labs/ tests ordered today include: Check be met today in anticipation of MRI. No orders of the defined types were placed in this encounter.   Recommend 150 minutes/week of aerobic exercise Low fat, low carb, high fiber diet recommended  Disposition:   FU in post cardiac MRI   Signed, Larae Grooms, MD  12/26/2017 3:04 PM    [facility] [address], [place], [place]  [address] Phone: [phone]; Fax: [phone]

## 2017-12-26 NOTE — Addendum Note (Signed)
Addended by: Drue Novel I on: 12/26/2017 04:21 PM   Modules accepted: Orders

## 2017-12-26 NOTE — Patient Instructions (Signed)
Medication Instructions:  Your physician recommends that you continue on your current medications as directed. Please refer to the Current Medication list given to you today.   Labwork: TODAY: BMET  Testing/Procedures: Your physician has requested that you have a cardiac MRI. Cardiac MRI uses a computer to create images of your heart as its beating, producing both still and moving pictures of your heart and major blood vessels. For further information please visit [email or web address] Please follow the instruction sheet given to you today for more information.  Follow-Up: Based on test results  Any Other Special Instructions Will Be Listed Below (If Applicable).     If you need a refill on your cardiac medications before your next appointment, please call your pharmacy.

## 2017-12-27 ENCOUNTER — Telehealth: Payer: Self-pay | Admitting: Interventional Cardiology

## 2017-12-27 LAB — BASIC METABOLIC PANEL
BUN/Creatinine Ratio: 12 (ref 10–24)
BUN: 13 mg/dL (ref 8–27)
CO2: 23 mmol/L (ref 20–29)
Calcium: 9 mg/dL (ref 8.6–10.2)
Chloride: 103 mmol/L (ref 96–106)
Creatinine, Ser: 1.09 mg/dL (ref 0.76–1.27)
GFR calc Af Amer: 77 mL/min/{1.73_m2} (ref >59)
GFR calc non Af Amer: 67 mL/min/{1.73_m2} (ref >59)
Glucose: 63 mg/dL — ABNORMAL LOW (ref 65–99)
Potassium: 4.4 mmol/L (ref 3.5–5.2)
Sodium: 141 mmol/L (ref 134–144)

## 2017-12-27 NOTE — Telephone Encounter (Signed)
Called patient and LVM to call me back with the time of day he would like his cardiac MRI scheduled.

## 2017-12-30 ENCOUNTER — Ambulatory Visit (HOSPITAL_COMMUNITY)
Admission: RE | Admit: 2017-12-30 | Discharge: 2017-12-30 | Disposition: A | Discharge status: Home / Self Care | Payer: Medicare Other | Source: Ambulatory Visit | Attending: Interventional Cardiology | Admitting: Interventional Cardiology

## 2017-12-30 DIAGNOSIS — I255 Ischemic cardiomyopathy: Secondary | ICD-10-CM | POA: Diagnosis not present

## 2017-12-30 DIAGNOSIS — I25118 Atherosclerotic heart disease of native coronary artery with other forms of angina pectoris: Secondary | ICD-10-CM | POA: Insufficient documentation

## 2017-12-30 DIAGNOSIS — I5023 Acute on chronic systolic (congestive) heart failure: Secondary | ICD-10-CM | POA: Diagnosis not present

## 2017-12-30 DIAGNOSIS — M6289 Other specified disorders of muscle: Secondary | ICD-10-CM | POA: Diagnosis not present

## 2017-12-30 MED ORDER — GADOBENATE DIMEGLUMINE 529 MG/ML IV SOLN
30.0000 mL | Freq: Once | INTRAVENOUS | Status: AC | PRN
  Administered 2017-12-30: 30 mL via INTRAVENOUS

## 2018-01-02 ENCOUNTER — Telehealth: Payer: Self-pay | Admitting: Interventional Cardiology

## 2018-01-02 ENCOUNTER — Other Ambulatory Visit: Payer: Self-pay

## 2018-01-02 DIAGNOSIS — I255 Ischemic cardiomyopathy: Secondary | ICD-10-CM

## 2018-01-02 DIAGNOSIS — I25118 Atherosclerotic heart disease of native coronary artery with other forms of angina pectoris: Secondary | ICD-10-CM

## 2018-01-02 NOTE — Telephone Encounter (Signed)
New Message   Pt calling states he is wearing a life vest since 6/16 and wants to know if he can take it off because its eating his back up. Please call

## 2018-01-02 NOTE — Telephone Encounter (Signed)
Called and made patient aware that he needs to continue to wear the lifevest. Instructed patient that he can try Cetaphil to see if this helps with the irritation.

## 2018-01-10 ENCOUNTER — Encounter: Payer: Medicare Other | Admitting: Cardiothoracic Surgery

## 2018-01-11 ENCOUNTER — Institutional Professional Consult (permissible substitution) (INDEPENDENT_AMBULATORY_CARE_PROVIDER_SITE_OTHER): Payer: Medicare Other | Admitting: Cardiothoracic Surgery

## 2018-01-11 ENCOUNTER — Other Ambulatory Visit: Payer: Self-pay

## 2018-01-11 ENCOUNTER — Encounter: Payer: Self-pay | Admitting: Cardiothoracic Surgery

## 2018-01-11 VITALS — BP 125/60 | HR 67 | Resp 16 | Ht 69.5 in | Wt 196.0 lb

## 2018-01-11 DIAGNOSIS — I255 Ischemic cardiomyopathy: Secondary | ICD-10-CM | POA: Diagnosis not present

## 2018-01-11 DIAGNOSIS — I2510 Atherosclerotic heart disease of native coronary artery without angina pectoris: Secondary | ICD-10-CM | POA: Diagnosis not present

## 2018-01-11 DIAGNOSIS — I5021 Acute systolic (congestive) heart failure: Secondary | ICD-10-CM | POA: Diagnosis not present

## 2018-01-11 NOTE — Progress Notes (Signed)
PCP is System, Pcp Not In Referring Provider is Jettie Booze, MD  Chief Complaint  Patient presents with  . Cardiomyopathy    ISCHEMIC.Marland KitchenMarland KitchenCATH 12/20/17 OUTSIDE HOSPITAL.Marland KitchenMarland KitchenEVAL FOR CABG...CARDIAC MORPH. MRI.Marland KitchenMarland Kitchen6/28/19  . Coronary Artery Disease    HPI: Patient examined, outside images of cardiac arteriograms at [facility] personally reviewed and counseled with patient.  Outside medical record from last month at [facility] personally reviewed.  77 year old nondiabetic smoker[2 packs/day] was admitted to the hospital in [facility] in mid June with acute respiratory failure, hypertensive crisis, non-STEMI and acute flash pulmonary edema.  His venous lactate was elevated at 3.2 and his PCO2 was 64, base deficit -10.  His chest x-ray showed pulmonary edema.  CT scan of chest showed bilateral pleural effusions with edema, no  lung mass or mediastinal adenopathy.  He was treated with Lasix with improved symptoms.  An echocardiogram showed EF of 25% without significant MR.  No TR.  He subsequently underwent left heart cath showing chronic total occlusion of LAD and chronic total occlusion of the RCA.  He had a large patent obtuse marginal that was free of disease and a smaller proximal marginal which had moderate stenosis.  No LVEDP or right heart pressures available.  The patient denies prior history of heart disease although his father died of an MI at age 40 and his mother had CABG.  The patient was evaluated by cardiology after his return to [facility] and he separately underwent MRI cardiac viability study.  This showed some transmural scarring at the apex and inferior septum but with significant viability in the basal posterior wall and the anterior wall.  Based on his cardiac viability study he has been recommended for CABG.  At the time of his hospitalization in [place] a LifeVest was placed on the patient which she has worn since.  It has never shocked him.   Since his hospitalization  In [place] 6 weeks ago he has not smoked.  He denies recurrent symptoms of exertional or at rest shortness of breath or chest pain.  Past Medical History:  Diagnosis Date  . Childhood asthma   . Hay fever   . Macular hole of left eye 12/23/2011  . Macular hole, right eye 03/05/2014  . Neuromuscular disorder (HCC)    numbness in feet  . PONV (postoperative nausea and vomiting)   . 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 Sinus headache    "hay fever season; not that often"  . Transverse myelitis ([place]) 2009   was treated by Dr Erling Cruz  . Umbilical hernia     Past Surgical History:  Procedure Laterality Date  . [place] VITRECTOMY WITH 20 GAUGE MVR PORT FOR MACULAR HOLE Left 03/08/2013   Procedure: 25 GAUGE PARS PLANA VITRECTOMY WITH 20 GAUGE MVR PORT FOR MACULAR HOLE;  Surgeon: Hayden Pedro, MD;  Location: [place];  Service: Ophthalmology;  Laterality: Left;  . 25 GAUGE PARS PLANA VITRECTOMY WITH 20 GAUGE MVR PORT FOR MACULAR HOLE Right 04/02/2014   Procedure: 20-25 GAUGE PARS PLANA VITRECTOMY ; MEMBRANE PEEL; HEADSCOPE LASER; SERUM PATCH; GAS/FLUID EXCHANGE ;C3F8;  Surgeon: Hayden Pedro, MD;  Location: [place];  Service: Ophthalmology;  Laterality: Right;  . CATARACT EXTRACTION W/ INTRAOCULAR LENS  IMPLANT, BILATERAL Bilateral 2014  . EYE SURGERY    . GAS INSERTION Left 03/08/2013   Procedure: INSERTION OF GAS;  Surgeon: Hayden Pedro, MD;  Location: [place];  Service: Ophthalmology;  Laterality: Left;  C3F8  . GAS INSERTION Right 05/07/2014   Procedure: INSERTION OF GAS (C3F8);  Surgeon: Hayden Pedro, MD;  Location: [place];  Service: Ophthalmology;  Laterality: Right;  . GAS/FLUID EXCHANGE Right 05/07/2014   Procedure: GAS/FLUID EXCHANGE;  Surgeon: Hayden Pedro, MD;  Location: [place];  Service: Ophthalmology;  Laterality: Right;  . LASER PHOTO ABLATION Right 05/07/2014   Procedure: LASER PHOTO ABLATION;  Surgeon: Hayden Pedro, MD;  Location: [place];  Service: Ophthalmology;  Laterality: Right;  . PARS PLANA VITRECTOMY  01/18/2012   Procedure: PARS PLANA VITRECTOMY WITH 25 GAUGE;  Surgeon: Hayden Pedro, MD;  Location: [place];  Service: Ophthalmology;  Laterality: Left;  25g. ppv for macular hole ; Laser treatment;Serum patch and Gas Injection(C3F8)  . PARS PLANA VITRECTOMY Right 05/07/2014   Procedure: PARS PLANA VITRECTOMY WITH 25 GAUGE;  Surgeon: Hayden Pedro, MD;  Location: [place];  Service: Ophthalmology;  Laterality: Right;  . PARS PLANA VITRECTOMY W/ REPAIR OF MACULAR HOLE Right 04/02/2014  . PHOTOCOAGULATION WITH LASER Left 03/08/2013   Procedure: PHOTOCOAGULATION WITH LASER;  Surgeon: Hayden Pedro, MD;  Location: [place];  Service: Ophthalmology;  Laterality: Left;  Headscope   . REPAIR OF COMPLEX TRACTION RETINAL DETACHMENT Right 05/07/2014   Procedure: REPAIR OF COMPLEX TRACTION RETINAL DETACHMENT;  Surgeon: Hayden Pedro, MD;  Location: [place];  Service: Ophthalmology;  Laterality: Right;  . SCLERAL BUCKLE Right 05/07/2014   Procedure: SCLERAL BUCKLE;  Surgeon: Hayden Pedro, MD;  Location: [place];  Service: Ophthalmology;  Laterality: Right;    Family History  Problem Relation Age of Onset  . Heart disease Mother   . Cancer Mother   . Heart attack Father     Social History Social History   Tobacco Use  . Smoking status: Former Smoker    Packs/day: 2.00    Years: 52.00    Pack years: 104.00    Types: Cigarettes    Last attempt to quit: 12/18/2017    Years since quitting: 0.0  . Smokeless tobacco: Never Used  Substance Use Topics  . Alcohol use: No  . Drug use: No    Current Outpatient Medications  Medication Sig Dispense Refill  . aspirin 81 MG chewable tablet Chew 81 mg by mouth daily.  0  . atorvastatin (LIPITOR) 40 MG tablet Take 40 mg by mouth daily.    . carvedilol (COREG) 6.25 MG tablet Take 6.25 mg by mouth 2 (two) times daily.    . furosemide (LASIX) 40 MG tablet Take  40 mg by mouth daily.    . isosorbide mononitrate (IMDUR) 30 MG 24 hr tablet Take 30 mg by mouth daily.    Marland Kitchen lisinopril (PRINIVIL,ZESTRIL) 2.5 MG tablet Take 2.5 mg by mouth daily.    Marland Kitchen acetaZOLAMIDE (DIAMOX SEQUELS) 500 MG capsule Take 1 capsule (500 mg total) by mouth 2 (two) times daily. (Patient not taking: Reported on 12/26/2017) 60 capsule 1   No current facility-administered medications for this visit.     Allergies  Allergen Reactions  . Typhoid Vaccines Swelling  . Codeine     Puts me to sleep  . Ivp Dye [Iodinated Diagnostic Agents] Hives and Itching    Face got splotchy.                       Review of Systems :  [ y ] = yes, [  ] = no  General :  Weight gain [   ]    Weight loss  [   ]  Fatigue [  ]  Fever [  ]  Chills  [  ]                                          HEENT    Headache [  ]  Dizziness [  ]  Blurred vision [ y ] Glaucoma  [  ] multiple eye procedures                        Nosebleeds [  ] Painful or loose teeth [  ]        Cardiac :  Chest pain/ pressure [  ]  Resting SOB Blue.Reese  ] exertional SOB [  y]                        Orthopnea [  ]  Pedal edema  [  ]  Palpitations [  ] Syncope/presyncope [ ]                         Paroxysmal nocturnal dyspnea [  ]         Pulmonary : cough [  ]  wheezing [  ]  Hemoptysis [  ] Sputum [  ] Snoring [  ]                              Pneumothorax [  ]  Sleep apnea [  ]        GI : Vomiting [  ]  Dysphagia [  ]  Melena  [  ]  Abdominal pain [  ] BRBPR [  ]              Heart burn [  ]  Constipation [  ] Diarrhea  [  ] Colonoscopy [   ]        GU : Hematuria [  ]  Dysuria [  ]  Nocturia [  ] UTI's [  ]        Vascular : Claudication [  ]  Rest pain [  ]  DVT [  ] Vein stripping [  ] leg ulcers [  ]                          TIA [  ] Stroke [  ]  Varicose veins [  ]        NEURO :  Headaches  [  ] Seizures [  ] Vision changes [  ] Paresthesias [  ]                                               Musculoskeletal :   Arthritis [  ] Gout  [  ]  Back pain [  ]  Joint pain [  ]        Skin :  Rash [  ]  Melanoma [  ] Sores [  ]  Heme : Bleeding problems [  ]Clotting Disorders [  ] Anemia [  ]Blood Transfusion [ ]         Endocrine : Diabetes [  ] Heat or Cold intolerance [  ] Polyuria [  ]excessive thirst [ ]         Psych : Depression [  ]  Anxiety [  ]  Psych hospitalizations [  ] Memory change [  ]                Right-hand-dominant     No history of thoracic trauma or pneumothorax      No prior surgery other than eye surgery                                                            BP 125/60 (BP Location: Right Arm, Patient Position: Sitting, Cuff Size: Large)   Pulse 67   Resp 16   Ht 5' 9.5" (1.765 m)   Wt 196 lb (88.9 kg)   SpO2 97% Comment: ON RA  BMI 28.53 kg/m  Physical Exam     Physical Exam  General: Elderly overweight male no acute distress accompanied by wife HEENT: Normocephalic pupils equal , dentition adequate Neck: Supple without JVD, adenopathy, or bruit Chest: Coarse breath sounds with, bilateral rhonchi/honking noises no tenderness             or deformity Cardiovascular: Regular rate and rhythm, no murmur, no gallop, peripheral pulses             palpable in all extremities Abdomen:  Soft, nontender, no palpable mass or organomegaly Extremities: Warm, well-perfused, no clubbing cyanosis edema or tenderness,              no venous stasis changes of the legs Rectal/GU: Deferred Neuro: Grossly non--focal and symmetrical throughout Skin: Clean and dry without rash or ulceration   Diagnostic Tests: Coronary angiograms reviewed.  Both the LAD and posterior descending are totally occluded and small, suboptimal but adequate for grafting.  Large OM appears to be normal.  Impression: Ischemic cardiomyopathy with episode of acute systolic heart failure requiring hospitalization at a outside hospital.  Although EF is 25% cardiac MRI viability study shows mixed  scarred and viable myocardium.  His cardiologist has recommended him for multivessel CABG.  His pulmonary status appears to be adequate by bedside mechanics.  CT scan at outside hospital showed no evidence of mass.  Chest x-ray here shows changes of COPD.  Prior to surgery will need to re-assess his valve function with echocardiogram and he will need right heart cath to assess for pulmonary hypertension and to document adequate cardiac output going into surgery.  Plan: Return after echocardiogram and right heart cath.  We will try to get the disc of his outside CT scan of chest delivered to the office.  He will continue his current medications and leave the life vest in place  Len Childs, MD [facility] [phone]

## 2018-01-12 ENCOUNTER — Other Ambulatory Visit: Payer: Self-pay | Admitting: *Deleted

## 2018-01-12 DIAGNOSIS — I255 Ischemic cardiomyopathy: Secondary | ICD-10-CM

## 2018-01-13 ENCOUNTER — Telehealth (HOSPITAL_COMMUNITY): Payer: Self-pay | Admitting: *Deleted

## 2018-01-13 ENCOUNTER — Other Ambulatory Visit: Payer: Self-pay

## 2018-01-13 ENCOUNTER — Ambulatory Visit (HOSPITAL_COMMUNITY): Payer: Medicare Other | Attending: Cardiology

## 2018-01-13 DIAGNOSIS — Z87891 Personal history of nicotine dependence: Secondary | ICD-10-CM | POA: Diagnosis not present

## 2018-01-13 DIAGNOSIS — I509 Heart failure, unspecified: Secondary | ICD-10-CM | POA: Insufficient documentation

## 2018-01-13 DIAGNOSIS — Z8249 Family history of ischemic heart disease and other diseases of the circulatory system: Secondary | ICD-10-CM | POA: Diagnosis not present

## 2018-01-13 DIAGNOSIS — Q211 Atrial septal defect: Secondary | ICD-10-CM | POA: Insufficient documentation

## 2018-01-13 DIAGNOSIS — I2510 Atherosclerotic heart disease of native coronary artery without angina pectoris: Secondary | ICD-10-CM | POA: Insufficient documentation

## 2018-01-13 DIAGNOSIS — I340 Nonrheumatic mitral (valve) insufficiency: Secondary | ICD-10-CM | POA: Insufficient documentation

## 2018-01-13 DIAGNOSIS — I255 Ischemic cardiomyopathy: Secondary | ICD-10-CM | POA: Diagnosis not present

## 2018-01-13 NOTE — Telephone Encounter (Signed)
Attempted to call pt to sch [facility] and Left message to call back

## 2018-01-13 NOTE — Telephone Encounter (Signed)
-----   Message from Laury Deep, RN sent at 01/12/2018 11:30 AM EDT ----- Drs. Bensimhon/McLean,  PVT saw this patient in clinic yesterday for CABG eval from Dr. Irish Lack.  He had a left heart cath done @ [facility] which we have uploaded to the CV workstation.  PVT would like either one of you to do a right heart cath on this patient.  He does have ischemic cardiomyopathy with episode of acute systolic heart failure requiring hospitalization at a outside hospital.  EF is 25%.  Thanks, [employer]

## 2018-01-16 ENCOUNTER — Encounter (HOSPITAL_COMMUNITY): Payer: Self-pay | Admitting: *Deleted

## 2018-01-16 ENCOUNTER — Other Ambulatory Visit (HOSPITAL_COMMUNITY): Payer: Self-pay | Admitting: *Deleted

## 2018-01-16 DIAGNOSIS — I255 Ischemic cardiomyopathy: Secondary | ICD-10-CM

## 2018-01-16 NOTE — Telephone Encounter (Signed)
Spoke w/pt, [place] sch for [facility] 7/18, instructions reviewed via phone, pt verbalized understanding.

## 2018-01-17 ENCOUNTER — Other Ambulatory Visit: Payer: Self-pay | Admitting: Interventional Cardiology

## 2018-01-17 MED ORDER — ISOSORBIDE MONONITRATE ER 30 MG PO TB24: 30.0000 mg | ORAL_TABLET | Freq: Every day | ORAL | 3 refills | Status: DC

## 2018-01-17 MED ORDER — FUROSEMIDE 40 MG PO TABS: 40.0000 mg | ORAL_TABLET | Freq: Every day | ORAL | 3 refills | Status: DC

## 2018-01-17 MED ORDER — LISINOPRIL 2.5 MG PO TABS: 2.5000 mg | ORAL_TABLET | Freq: Every day | ORAL | 3 refills | Status: DC

## 2018-01-17 MED ORDER — CARVEDILOL 6.25 MG PO TABS: 6.2500 mg | ORAL_TABLET | Freq: Two times a day (BID) | ORAL | 3 refills | Status: DC

## 2018-01-17 MED ORDER — ATORVASTATIN CALCIUM 40 MG PO TABS: 40.0000 mg | ORAL_TABLET | Freq: Every day | ORAL | 3 refills | Status: DC

## 2018-01-17 NOTE — Telephone Encounter (Signed)
New Message       *STAT* If patient is at the pharmacy, call can be transferred to refill team.   1. Which medications need to be refilled? (please list name of each medication and dose if known) Asprin 81mg /Atorvastatin 40mg /Carvdilol 6.25 Furosemide 40mg / Isosordide monitrate 30 mg/Lisinopril 2.5  2. Which pharmacy/location (including street and city if local pharmacy) is medication to be sent to? [facility] [address]  3. Do they need a 30 day or 90 day supply? [place]

## 2018-01-17 NOTE — Telephone Encounter (Signed)
Pt's medication was sent to pt's pharmacy as requested. Confirmation received.  °

## 2018-01-18 NOTE — Progress Notes (Signed)
Patient's echo was done on 7/12 and he is having his [place] tomorrow with Kirk Ruths

## 2018-01-19 ENCOUNTER — Ambulatory Visit (HOSPITAL_COMMUNITY)
Admission: RE | Admit: 2018-01-19 | Discharge: 2018-01-19 | Disposition: A | Discharge status: Home / Self Care | Payer: Medicare Other | Source: Ambulatory Visit | Attending: Cardiology | Admitting: Cardiology

## 2018-01-19 ENCOUNTER — Encounter (HOSPITAL_COMMUNITY): Payer: Self-pay | Admitting: Cardiology

## 2018-01-19 ENCOUNTER — Encounter (HOSPITAL_COMMUNITY)
Admission: RE | Disposition: A | Discharge status: Home / Self Care | Payer: Self-pay | Source: Ambulatory Visit | Attending: Cardiology

## 2018-01-19 ENCOUNTER — Other Ambulatory Visit: Payer: Self-pay

## 2018-01-19 DIAGNOSIS — I2510 Atherosclerotic heart disease of native coronary artery without angina pectoris: Secondary | ICD-10-CM | POA: Diagnosis not present

## 2018-01-19 DIAGNOSIS — I509 Heart failure, unspecified: Secondary | ICD-10-CM | POA: Diagnosis not present

## 2018-01-19 DIAGNOSIS — I255 Ischemic cardiomyopathy: Secondary | ICD-10-CM | POA: Insufficient documentation

## 2018-01-19 DIAGNOSIS — Z87891 Personal history of nicotine dependence: Secondary | ICD-10-CM | POA: Diagnosis not present

## 2018-01-19 DIAGNOSIS — I110 Hypertensive heart disease with heart failure: Secondary | ICD-10-CM | POA: Insufficient documentation

## 2018-01-19 DIAGNOSIS — I5021 Acute systolic (congestive) heart failure: Secondary | ICD-10-CM | POA: Insufficient documentation

## 2018-01-19 DIAGNOSIS — Z8249 Family history of ischemic heart disease and other diseases of the circulatory system: Secondary | ICD-10-CM | POA: Insufficient documentation

## 2018-01-19 DIAGNOSIS — Z7982 Long term (current) use of aspirin: Secondary | ICD-10-CM | POA: Diagnosis not present

## 2018-01-19 DIAGNOSIS — I2721 Secondary pulmonary arterial hypertension: Secondary | ICD-10-CM | POA: Diagnosis not present

## 2018-01-19 DIAGNOSIS — Z91041 Radiographic dye allergy status: Secondary | ICD-10-CM | POA: Diagnosis not present

## 2018-01-19 HISTORY — PX: RIGHT HEART CATH: CATH118263

## 2018-01-19 LAB — POCT I-STAT 3, VENOUS BLOOD GAS (G3P V)
Acid-Base Excess: 1 mmol/L (ref 0.0–2.0)
Acid-Base Excess: 2 mmol/L (ref 0.0–2.0)
Bicarbonate: 26.7 mmol/L (ref 20.0–28.0)
Bicarbonate: 27.7 mmol/L (ref 20.0–28.0)
O2 Saturation: 56 %
O2 Saturation: 59 %
TCO2: 28 mmol/L (ref 22–32)
TCO2: 29 mmol/L (ref 22–32)
pCO2, Ven: 45.4 mmHg (ref 44.0–60.0)
pCO2, Ven: 46.9 mmHg (ref 44.0–60.0)
pH, Ven: 7.378 (ref 7.250–7.430)
pH, Ven: 7.378 (ref 7.250–7.430)
pO2, Ven: 30 mmHg — CL (ref 32.0–45.0)
pO2, Ven: 32 mmHg (ref 32.0–45.0)

## 2018-01-19 LAB — CBC
HCT: 42.2 % (ref 39.0–52.0)
Hemoglobin: 14.1 g/dL (ref 13.0–17.0)
MCH: 30.8 pg (ref 26.0–34.0)
MCHC: 33.4 g/dL (ref 30.0–36.0)
MCV: 92.1 fL (ref 78.0–100.0)
Platelets: 155 10*3/uL (ref 150–400)
RBC: 4.58 MIL/uL (ref 4.22–5.81)
RDW: 12.3 % (ref 11.5–15.5)
WBC: 13.9 10*3/uL — ABNORMAL HIGH (ref 4.0–10.5)

## 2018-01-19 LAB — BASIC METABOLIC PANEL
Anion gap: 10 (ref 5–15)
BUN: 17 mg/dL (ref 8–23)
CO2: 27 mmol/L (ref 22–32)
Calcium: 8.8 mg/dL — ABNORMAL LOW (ref 8.9–10.3)
Chloride: 104 mmol/L (ref 98–111)
Creatinine, Ser: 1.26 mg/dL — ABNORMAL HIGH (ref 0.61–1.24)
GFR calc Af Amer: >60 mL/min (ref >60)
GFR calc non Af Amer: 54 mL/min — ABNORMAL LOW (ref >60)
Glucose, Bld: 126 mg/dL — ABNORMAL HIGH (ref 70–99)
Potassium: 4.1 mmol/L (ref 3.5–5.1)
Sodium: 141 mmol/L (ref 135–145)

## 2018-01-19 SURGERY — RIGHT HEART CATH
Anesthesia: LOCAL

## 2018-01-19 MED ORDER — FUROSEMIDE 40 MG PO TABS: ORAL_TABLET | ORAL | 6 refills | Status: DC

## 2018-01-19 MED ORDER — LIDOCAINE HCL (PF) 1 % IJ SOLN
INTRAMUSCULAR | Status: AC
  Filled 2018-01-19: qty 30

## 2018-01-19 MED ORDER — HEPARIN (PORCINE) IN NACL 1000-0.9 UT/500ML-% IV SOLN
INTRAVENOUS | Status: DC | PRN
  Administered 2018-01-19: 500 mL

## 2018-01-19 MED ORDER — SODIUM CHLORIDE 0.9 % IV SOLN
INTRAVENOUS | Status: DC
  Administered 2018-01-19: 11:00:00 via INTRAVENOUS

## 2018-01-19 MED ORDER — ASPIRIN 81 MG PO CHEW
81.0000 mg | CHEWABLE_TABLET | Freq: Once | ORAL | Status: AC
  Administered 2018-01-19: 81 mg via ORAL

## 2018-01-19 MED ORDER — HEPARIN (PORCINE) IN NACL 1000-0.9 UT/500ML-% IV SOLN
INTRAVENOUS | Status: AC
  Filled 2018-01-19: qty 500

## 2018-01-19 MED ORDER — ACETAMINOPHEN 325 MG PO TABS: 650.0000 mg | ORAL_TABLET | ORAL | Status: DC | PRN

## 2018-01-19 MED ORDER — SODIUM CHLORIDE 0.9 % IV SOLN: 250.0000 mL | INTRAVENOUS | Status: DC | PRN

## 2018-01-19 MED ORDER — DIGOXIN 125 MCG PO TABS: 0.1250 mg | ORAL_TABLET | Freq: Every day | ORAL | 6 refills | Status: DC

## 2018-01-19 MED ORDER — SODIUM CHLORIDE 0.9% FLUSH: 3.0000 mL | INTRAVENOUS | Status: DC | PRN

## 2018-01-19 MED ORDER — ASPIRIN 81 MG PO CHEW
CHEWABLE_TABLET | ORAL | Status: AC
  Filled 2018-01-19: qty 1

## 2018-01-19 MED ORDER — LIDOCAINE HCL (PF) 1 % IJ SOLN
INTRAMUSCULAR | Status: DC | PRN
  Administered 2018-01-19: 2 mL via INTRADERMAL

## 2018-01-19 MED ORDER — SODIUM CHLORIDE 0.9% FLUSH: 3.0000 mL | Freq: Two times a day (BID) | INTRAVENOUS | Status: DC

## 2018-01-19 MED ORDER — ONDANSETRON HCL 4 MG/2ML IJ SOLN: 4.0000 mg | Freq: Four times a day (QID) | INTRAMUSCULAR | Status: DC | PRN

## 2018-01-19 SURGICAL SUPPLY — 6 items
CATH BALLN WEDGE 5F 110CM (CATHETERS) ×2 IMPLANT
ELECT DEFIB PAD ADLT CADENCE (PAD) ×2 IMPLANT
KIT HEART LEFT (KITS) ×2 IMPLANT
PACK CARDIAC CATHETERIZATION (CUSTOM PROCEDURE TRAY) ×2 IMPLANT
SHEATH GLIDE SLENDER 4/5FR (SHEATH) ×2 IMPLANT
TRANSDUCER W/STOPCOCK (MISCELLANEOUS) ×2 IMPLANT

## 2018-01-19 NOTE — Discharge Instructions (Signed)
**Note Robert [name]-identified via Obfuscation** Brachial Site Care °Refer to this sheet in the next few weeks. These instructions provide you with information about caring for yourself after your procedure. Your health care provider may also give you more specific instructions. Your treatment has been planned according to current medical practices, but problems sometimes occur. Call your health care provider if you have any problems or questions after your procedure. °What can I expect after the procedure? °After your procedure, it is typical to have the following: °· Bruising at the brachial site that usually fades within 1-2 weeks. °· Blood collecting in the tissue (hematoma) that may be painful to the touch. It should usually decrease in size and tenderness within 1-2 weeks. ° °Follow these instructions at home: °· Take medicines only as directed by your health care provider. °· You may shower 24-48 hours after the procedure or as directed by your health care provider. Remove the bandage (dressing) and gently wash the site with plain soap and water. Pat the area dry with a clean towel. Do not rub the site, because this may cause bleeding. °· Do not take baths, swim, or use a hot tub until your health care provider approves. °· Check your insertion site every day for redness, swelling, or drainage. °· Do not apply powder or lotion to the site. °· Do not flex or bend the affected arm for 24 hours or as directed by your health care provider. °· Do not push or pull heavy objects with the affected arm for 24 hours or as directed by your health care provider. °· Do not lift over 10 lb (4.5 kg) for 5 days after your procedure or as directed by your health care provider. °· Ask your health care provider when it is okay to: °? Return to work or school. °? Resume usual physical activities or sports. °? Resume sexual activity. °· Do not drive home if you are discharged the same day as the procedure. Have someone else drive you. °· You may drive 24 hours after the  procedure unless otherwise instructed by your health care provider. °· Do not operate machinery or power tools for 24 hours after the procedure. °· If your procedure was done as an outpatient procedure, which means that you went home the same day as your procedure, a responsible adult should be with you for the first 24 hours after you arrive home. °· Keep all follow-up visits as directed by your health care provider. This is important. °Contact a health care provider if: °· You have a fever. °· You have chills. °· You have increased bleeding from the radial site. Hold pressure on the site. °Get help right away if: °· You have unusual pain at the brachial site. °· You have redness, warmth, or swelling at the brachial site. °· You have drainage (other than a small amount of blood on the dressing) from the brachial site. °· The brachial site is bleeding, and the bleeding does not stop after 30 minutes of holding steady pressure on the site. °· Your arm or hand becomes pale, cool, tingly, or numb. °This information is not intended to replace advice given to you by your health care provider. Make sure you discuss any questions you have with your health care provider. °Document Released: 07/24/2010 Document Revised: 11/27/2015 Document Reviewed: 01/07/2014 °Elsevier Interactive Patient Education © 2018 [employer] ° °

## 2018-01-19 NOTE — Progress Notes (Addendum)
Spoke with Kristeen Miss about pt not taking aspirin this am order noted and aspirin given. Pt also states that he had allergy to yellow dye during eye surgery and has had CT scan and has been fine without reaction.

## 2018-01-27 ENCOUNTER — Encounter (HOSPITAL_COMMUNITY): Payer: Self-pay | Admitting: Cardiology

## 2018-01-27 ENCOUNTER — Ambulatory Visit (HOSPITAL_COMMUNITY)
Admit: 2018-01-27 | Discharge: 2018-01-27 | Disposition: A | Discharge status: Home / Self Care | Payer: Medicare Other | Attending: Cardiology | Admitting: Cardiology

## 2018-01-27 VITALS — BP 148/72 | HR 71 | Ht 69.5 in | Wt 198.0 lb

## 2018-01-27 DIAGNOSIS — I25118 Atherosclerotic heart disease of native coronary artery with other forms of angina pectoris: Secondary | ICD-10-CM | POA: Diagnosis not present

## 2018-01-27 DIAGNOSIS — Z79899 Other long term (current) drug therapy: Secondary | ICD-10-CM | POA: Diagnosis not present

## 2018-01-27 DIAGNOSIS — E785 Hyperlipidemia, unspecified: Secondary | ICD-10-CM | POA: Insufficient documentation

## 2018-01-27 DIAGNOSIS — I252 Old myocardial infarction: Secondary | ICD-10-CM | POA: Insufficient documentation

## 2018-01-27 DIAGNOSIS — I2510 Atherosclerotic heart disease of native coronary artery without angina pectoris: Secondary | ICD-10-CM | POA: Insufficient documentation

## 2018-01-27 DIAGNOSIS — Z87891 Personal history of nicotine dependence: Secondary | ICD-10-CM | POA: Insufficient documentation

## 2018-01-27 DIAGNOSIS — I5022 Chronic systolic (congestive) heart failure: Secondary | ICD-10-CM | POA: Insufficient documentation

## 2018-01-27 DIAGNOSIS — I255 Ischemic cardiomyopathy: Secondary | ICD-10-CM | POA: Diagnosis not present

## 2018-01-27 DIAGNOSIS — Z7982 Long term (current) use of aspirin: Secondary | ICD-10-CM | POA: Diagnosis not present

## 2018-01-27 LAB — DIGOXIN LEVEL: Digoxin Level: 0.5 ng/mL — ABNORMAL LOW (ref 0.8–2.0)

## 2018-01-27 LAB — LIPID PANEL
Cholesterol: 98 mg/dL (ref 0–200)
HDL: 29 mg/dL — ABNORMAL LOW (ref >40)
LDL Cholesterol: 28 mg/dL (ref 0–99)
Total CHOL/HDL Ratio: 3.4 RATIO
Triglycerides: 204 mg/dL — ABNORMAL HIGH (ref <150)
VLDL: 41 mg/dL — ABNORMAL HIGH (ref 0–40)

## 2018-01-27 LAB — BASIC METABOLIC PANEL
Anion gap: 9 (ref 5–15)
BUN: 14 mg/dL (ref 8–23)
CO2: 29 mmol/L (ref 22–32)
Calcium: 9 mg/dL (ref 8.9–10.3)
Chloride: 102 mmol/L (ref 98–111)
Creatinine, Ser: 1.22 mg/dL (ref 0.61–1.24)
GFR calc Af Amer: >60 mL/min (ref >60)
GFR calc non Af Amer: 57 mL/min — ABNORMAL LOW (ref >60)
Glucose, Bld: 184 mg/dL — ABNORMAL HIGH (ref 70–99)
Potassium: 3.9 mmol/L (ref 3.5–5.1)
Sodium: 140 mmol/L (ref 135–145)

## 2018-01-27 MED ORDER — SACUBITRIL-VALSARTAN 24-26 MG PO TABS: 1.0000 | ORAL_TABLET | Freq: Two times a day (BID) | ORAL | 3 refills | Status: DC

## 2018-01-27 NOTE — Patient Instructions (Signed)
Labs today (will call for abnormal results, otherwise no news is good news)  STOP Lisinopril  START Entresto 24-26 mg (1 Tablet) Twice Daily.  Wait 36 hours after your last dose of lisinopril before starting Entresto.  Labs in 10 days (bmet)  Follow up in 1 month.

## 2018-01-29 NOTE — Progress Notes (Signed)
[facility]: Dr. Aundra Dubin  77 yo with history of CAD and ischemic cardiomyopathy presents for followup of CHF. Patient had no cardiac history prior to 6/19. However, since around 12/18, he had noted exertional dyspnea.  In 6[place]19, [place]  He developed very severe dyspnea and went to the hospital in [place] where he was found to have NSTEMI.  LHC was done, showing chronic occlusions of the LAD and RCA with collaterals.  Echo showed EF 25-30%.  No intervention, he was eventually discharged to come home.  He has been seen by Dr. Prescott Gum for CABG evaluation.  Cardiac MRI in 6/19 showed EF 28% with substantial viability.  RHC was done in 7/19, showing mildly elevated filling pressures but low cardiac output, with CI 1.7.  After RHC, I increased his Lasix and added digoxin.  He is wearing a Lifevest.   Of note, he has quit smoking since 6/19.   He says that he is feeling good symptomatically.  He has had no chest pain.  Breathing is actually better on current cardiac regimen.  He can walk on flat ground and up a flight of stairs without significant dyspnea.  No orthopnea/PND.  He has a chronic cough that has worsened since starting lisinopril.   ECG (6/19, personally reviewed): NSR, LVH with repolarization abnormality.   Labs (7/19): hgb 14.1, K 4.1, creatinine 1.26  PMH: 1. H/o retinal detachment 2. H/o transverse myelitis (remote) 3. CAD: NSTEMI 6/19 in [place]. - LHC (6/19): Chronic total occlusion of LAD and chronic total occlusion of RCA with collaterals.  4. Chronic systolic CHF: Ischemic cardiomyopathy.   - Echo (6/19): LV moderately dilated, EF 25-30%, mild MR.  - Cardiac MRI (6/19): EF 28%, significant viability noted.  - RHC (7/19): mean RA 8, PA 40/16 mean 26, mean PCWP 23, CI 1.7, PVR 0.83 WU  SH: Married, lives in [place].  He quit smoking in 6/19.    Family History  Problem Relation Age of Onset  . Heart disease Mother   . Cancer Mother    . Heart attack Father    ROS: All systems reviewed and negative except as per HPI.   Current Outpatient Medications  Medication Sig Dispense Refill  . aspirin 81 MG chewable tablet Chew 81 mg by mouth daily.  0  . atorvastatin (LIPITOR) 40 MG tablet Take 1 tablet (40 mg total) by mouth at bedtime. 90 tablet 3  . carvedilol (COREG) 6.25 MG tablet Take 1 tablet (6.25 mg total) by mouth 2 (two) times daily. 180 tablet 3  . digoxin (LANOXIN) 0.125 MG tablet Take 1 tablet (0.125 mg total) by mouth daily. 30 tablet 6  . furosemide (LASIX) 40 MG tablet Take 40 mg (1 tab) every morning, and 20 mg (1/2 tab) every evening. 45 tablet 6  . isosorbide mononitrate (IMDUR) 30 MG 24 hr tablet Take 1 tablet (30 mg total) by mouth daily. 90 tablet 3  . Polyethyl Glycol-Propyl Glycol (SYSTANE) 0.4-0.3 % SOLN Place 1 drop into both eyes 2 (two) times daily.    . sacubitril-valsartan (ENTRESTO) 24-26 MG Take 1 tablet by mouth 2 (two) times daily. 60 tablet 3   No current facility-administered medications for this encounter.    BP (!) 148/72   Pulse 71   Ht 5' 9.5" (1.765 m)   Wt 198 lb (89.8 kg)   BMI 28.82 kg/m  General: NAD Neck: JVP 7-8 cm, no thyromegaly or thyroid nodule.  Lungs: Clear to auscultation  bilaterally with normal respiratory effort. CV: Nondisplaced PMI.  Heart regular S1/S2, no S3/S4, no murmur.  Trace ankle edema.  No carotid bruit.  Normal pedal pulses.  Abdomen: Soft, nontender, no hepatosplenomegaly, no distention.  Skin: Intact without lesions or rashes.  Neurologic: Alert and oriented x 3.  Psych: Normal affect. Extremities: No clubbing or cyanosis.  HEENT: Normal.   Assessment/Plan: 1. CAD: LHC in 6/19 with CTOs of LAD and RCA.  He has been seen by Dr. Prescott Gum for CABG evaluation and was referred to CHF clinic for evaluation prior to CABG. Of note, cardiac MRI showed significant myocardial viability. Symptomatically, he appears to be doing well despite low output on RHC.   NYHA class II symptoms, breathing better on current regimen.  - Continue ASA 81, statin, and Imdur.  - I think that he is ready for CABG, will send note to Dr Prescott Gum.  2. Chronic systolic CHF: Ischemic cardiomyopathy.  Echo in 6/19 with EF 25-20%.  Cardiac MRI in 7/19 with EF 28%, significant viability noted. RHC in 7/19 showed mildly elevated filling pressure and low cardiac output, so Lasix was increased and digoxin was added.  On exam, he is minimally volume overloaded.  NYHA class II symptoms (improved).  - Continue digoxin 0.125, check level today.  - Stop lisinopril, in 36 hrs will start Entresto 24/26 bid.  BMET in 10 days.  - Continue Coreg 6.25 mg bid.  - Next step will be addition of spironolactone.  - He will be wearing a Lifevest until CABG.  - Continue Lasix 40 qam/20 qpm with BMET today.  3. Smoking: He has quit since 6/19.  4. Hyperlipidemia: Check lipids today on atorvastatin.   Followup in 1 month.   [name] 01/29/2018

## 2018-02-06 ENCOUNTER — Telehealth (HOSPITAL_COMMUNITY): Payer: Self-pay

## 2018-02-06 ENCOUNTER — Ambulatory Visit (HOSPITAL_COMMUNITY)
Admission: RE | Admit: 2018-02-06 | Discharge: 2018-02-06 | Disposition: A | Discharge status: Home / Self Care | Payer: Medicare Other | Source: Ambulatory Visit | Attending: Internal Medicine | Admitting: Internal Medicine

## 2018-02-06 ENCOUNTER — Other Ambulatory Visit (HOSPITAL_COMMUNITY): Payer: Self-pay | Admitting: *Deleted

## 2018-02-06 DIAGNOSIS — I5022 Chronic systolic (congestive) heart failure: Secondary | ICD-10-CM | POA: Diagnosis not present

## 2018-02-06 LAB — BASIC METABOLIC PANEL
Anion gap: 9 (ref 5–15)
BUN: 13 mg/dL (ref 8–23)
CO2: 28 mmol/L (ref 22–32)
Calcium: 8.7 mg/dL — ABNORMAL LOW (ref 8.9–10.3)
Chloride: 103 mmol/L (ref 98–111)
Creatinine, Ser: 1.3 mg/dL — ABNORMAL HIGH (ref 0.61–1.24)
GFR calc Af Amer: >60 mL/min (ref >60)
GFR calc non Af Amer: 52 mL/min — ABNORMAL LOW (ref >60)
Glucose, Bld: 257 mg/dL — ABNORMAL HIGH (ref 70–99)
Potassium: 3.9 mmol/L (ref 3.5–5.1)
Sodium: 140 mmol/L (ref 135–145)

## 2018-02-06 MED ORDER — DOXYCYCLINE HYCLATE 100 MG PO CAPS: 100.0000 mg | ORAL_CAPSULE | Freq: Two times a day (BID) | ORAL | 0 refills | Status: DC

## 2018-02-06 NOTE — Telephone Encounter (Signed)
Patient is aware and medication sent to pharmacy.  I advised him if he continues to have problems after taking the antibiotic he needs to call his PCP or go to an Urgent Care.  He is aware and no further questions.

## 2018-02-06 NOTE — Telephone Encounter (Signed)
He can have a course of doxycycline 100 mg bid x 7 days.

## 2018-02-06 NOTE — Telephone Encounter (Signed)
Pt states that he is due for bypass surgery and he currently has a sinus infection with a cough and he is scared they will not do the surgery. Pt wants to know if there is something he can take? Please advise.

## 2018-02-08 ENCOUNTER — Other Ambulatory Visit (HOSPITAL_COMMUNITY): Payer: Self-pay

## 2018-02-08 ENCOUNTER — Telehealth (HOSPITAL_COMMUNITY): Payer: Self-pay

## 2018-02-08 MED ORDER — BENZONATATE 100 MG PO CAPS: 100.0000 mg | ORAL_CAPSULE | Freq: Three times a day (TID) | ORAL | 0 refills | Status: DC | PRN

## 2018-02-08 NOTE — Telephone Encounter (Signed)
Rx sent to pharmacy. Pt notified. 

## 2018-02-08 NOTE — Telephone Encounter (Signed)
Ms. Sagar called to inform you that pt is still coughing continuously. Wants to know if there is something else he can try. Please inform.

## 2018-02-08 NOTE — Telephone Encounter (Signed)
Have him try tessalon perles.

## 2018-02-16 ENCOUNTER — Encounter: Payer: Self-pay | Admitting: Cardiothoracic Surgery

## 2018-02-16 ENCOUNTER — Ambulatory Visit (INDEPENDENT_AMBULATORY_CARE_PROVIDER_SITE_OTHER): Payer: Medicare Other | Admitting: Cardiothoracic Surgery

## 2018-02-16 VITALS — BP 130/70 | HR 74 | Resp 20 | Ht 69.5 in | Wt 202.0 lb

## 2018-02-16 DIAGNOSIS — I5021 Acute systolic (congestive) heart failure: Secondary | ICD-10-CM

## 2018-02-16 DIAGNOSIS — I2510 Atherosclerotic heart disease of native coronary artery without angina pectoris: Secondary | ICD-10-CM | POA: Diagnosis not present

## 2018-02-16 DIAGNOSIS — I255 Ischemic cardiomyopathy: Secondary | ICD-10-CM

## 2018-02-16 NOTE — Progress Notes (Signed)
PCP is System, Pcp Not In Referring Provider is Larey Dresser, MD  Chief Complaint  Patient presents with  . Coronary Artery Disease    Surgical eval Cardiac Cath 01/19/18, ECHO 01/13/18     HPI: Patient returns for scheduled visit to discuss CABG for his ischemic cardiomyopathy, EF 25% with total LAD occlusion, total RCA occlusion, and patent circumflex marginal.  Since his initial consultation he is undergone right heart catheterization which demonstrated moderate pulmonary hypertension, 45/20 wedge 23, CVP normal at 8 and low cardiac output 3.6 with index of 1.7.  He underwent an echocardiogram showing EF 25% with significant inferior and anterior hypokinesia but no significant valvular disease.  The patient has been placed on Entresto by Dr. Algernon Huxley and followed in the heart failure clinic.  He is also placed on Lasix because of his elevated filling pressures.  Patient was recently seen by Dr. Algernon Huxley who feels the patient is optimized regard to his cardiac status for surgery.  The patient has a greater than 100-y pack-year history of smoking and stopped 2 months ago when he presented at an outside hospital with a non-STEMI and pulmonary edema.  CT scan of the chest was performed showing no evidence of pulmonary mass or abnormal adenopathy.  CT scan did show evidence of pulmonary edema for which the patient was treated.  No pulmonary emboli.  Patient presents for high risk surgical coronary rationalization with poor LV function and graftable but suboptimal targets.  Currently the patient is being treated for a upper respiratory infection-sinusitis with a course of oral Vibramycin.  He still has a wet cough which is productive and has been sneezing.  Patient's greatest risk for surgery is pulmonary so his bronchitis-sinusitis will need to be cleared up before we schedule surgery.  He also will undergo coronary function testing.  I have given the patient a 5-day course of Zithromax to clear up his  pulmonary infection.  He remains smoke free.  He has had no weight gain or edema.  He denies chest pain but his activity level has been low.  Past Medical History:  Diagnosis Date  . Childhood asthma   . Hay fever   . Macular hole of left eye 12/23/2011  . Macular hole, right eye 03/05/2014  . Neuromuscular disorder (HCC)    numbness in feet  . PONV (postoperative nausea and vomiting)   . 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 Sinus headache    "hay fever season; not that often"  . Transverse myelitis ([place]) 2009   was treated by Dr Erling Cruz  . Umbilical hernia     Past Surgical History:  Procedure Laterality Date  . [place] VITRECTOMY WITH 20 GAUGE MVR PORT FOR MACULAR HOLE Left 03/08/2013   Procedure: 25 GAUGE PARS PLANA VITRECTOMY WITH 20 GAUGE MVR PORT FOR MACULAR HOLE;  Surgeon: Hayden Pedro, MD;  Location: [address];  Service: Ophthalmology;  Laterality: Left;  . 25 GAUGE PARS PLANA VITRECTOMY WITH 20 GAUGE MVR PORT FOR MACULAR HOLE Right 04/02/2014   Procedure: 20-25 GAUGE PARS PLANA VITRECTOMY ; MEMBRANE PEEL; HEADSCOPE LASER; SERUM PATCH; GAS/FLUID EXCHANGE ;C3F8;  Surgeon: Hayden Pedro, MD;  Location: [facility];  Service: Ophthalmology;  Laterality: Right;  . CATARACT EXTRACTION W/ INTRAOCULAR LENS  IMPLANT, BILATERAL Bilateral 2014  . EYE SURGERY    . GAS INSERTION Left 03/08/2013   Procedure: INSERTION OF GAS;  Surgeon: Hayden Pedro, MD;  Location: [facility];  Service:  Ophthalmology;  Laterality: Left;  C3F8  . GAS INSERTION Right 05/07/2014   Procedure: INSERTION OF GAS (C3F8);  Surgeon: Hayden Pedro, MD;  Location: [facility];  Service: Ophthalmology;  Laterality: Right;  . GAS/FLUID EXCHANGE Right 05/07/2014   Procedure: GAS/FLUID EXCHANGE;  Surgeon: Hayden Pedro, MD;  Location: [facility];  Service: Ophthalmology;  Laterality: Right;  . LASER PHOTO ABLATION Right 05/07/2014   Procedure: LASER PHOTO ABLATION;  Surgeon: Hayden Pedro, MD;  Location: [facility];   Service: Ophthalmology;  Laterality: Right;  . PARS PLANA VITRECTOMY  01/18/2012   Procedure: PARS PLANA VITRECTOMY WITH 25 GAUGE;  Surgeon: Hayden Pedro, MD;  Location: [facility];  Service: Ophthalmology;  Laterality: Left;  25g. ppv for macular hole ; Laser treatment;Serum patch and Gas Injection(C3F8)  . PARS PLANA VITRECTOMY Right 05/07/2014   Procedure: PARS PLANA VITRECTOMY WITH 25 GAUGE;  Surgeon: Hayden Pedro, MD;  Location: [facility];  Service: Ophthalmology;  Laterality: Right;  . PARS PLANA VITRECTOMY W/ REPAIR OF MACULAR HOLE Right 04/02/2014  . PHOTOCOAGULATION WITH LASER Left 03/08/2013   Procedure: PHOTOCOAGULATION WITH LASER;  Surgeon: Hayden Pedro, MD;  Location: [facility];  Service: Ophthalmology;  Laterality: Left;  Headscope   . REPAIR OF COMPLEX TRACTION RETINAL DETACHMENT Right 05/07/2014   Procedure: REPAIR OF COMPLEX TRACTION RETINAL DETACHMENT;  Surgeon: Hayden Pedro, MD;  Location: [facility];  Service: Ophthalmology;  Laterality: Right;  . RIGHT HEART CATH N/A 01/19/2018   Procedure: RIGHT HEART CATH;  Surgeon: Larey Dresser, MD;  Location: [facility];  Service: Cardiovascular;  Laterality: N/A;  . SCLERAL BUCKLE Right 05/07/2014   Procedure: SCLERAL BUCKLE;  Surgeon: Hayden Pedro, MD;  Location: [facility];  Service: Ophthalmology;  Laterality: Right;    Family History  Problem Relation Age of Onset  . Heart disease Mother   . Cancer Mother   . Heart attack Father     Social History Social History   Tobacco Use  . Smoking status: Former Smoker    Packs/day: 2.00    Years: 52.00    Pack years: 104.00    Types: Cigarettes    Last attempt to quit: 12/18/2017    Years since quitting: 0.1  . Smokeless tobacco: Never Used  Substance Use Topics  . Alcohol use: No  . Drug use: No    Current Outpatient Medications  Medication Sig Dispense Refill  . aspirin 81 MG chewable tablet Chew 81 mg by mouth daily.  0  . atorvastatin (LIPITOR) 40 MG tablet Take 1  tablet (40 mg total) by mouth at bedtime. 90 tablet 3  . benzonatate (TESSALON PERLES) 100 MG capsule Take 1 capsule (100 mg total) by mouth 3 (three) times daily as needed for cough. 30 capsule 0  . carvedilol (COREG) 6.25 MG tablet Take 1 tablet (6.25 mg total) by mouth 2 (two) times daily. 180 tablet 3  . digoxin (LANOXIN) 0.125 MG tablet Take 1 tablet (0.125 mg total) by mouth daily. 30 tablet 6  . furosemide (LASIX) 40 MG tablet Take 40 mg (1 tab) every morning, and 20 mg (1/2 tab) every evening. 45 tablet 6  . isosorbide mononitrate (IMDUR) 30 MG 24 hr tablet Take 1 tablet (30 mg total) by mouth daily. 90 tablet 3  . Polyethyl Glycol-Propyl Glycol (SYSTANE) 0.4-0.3 % SOLN Place 1 drop into both eyes 2 (two) times daily.    . sacubitril-valsartan (ENTRESTO) 24-26 MG Take 1 tablet by mouth  2 (two) times daily. 60 tablet 3   No current facility-administered medications for this visit.     Allergies  Allergen Reactions  . Typhoid Vaccines Swelling  . Codeine Other (See Comments)    Puts me to sleep  . Ivp Dye [Iodinated Diagnostic Agents] Hives, Itching and Other (See Comments)    Face got splotchy.    . Yellow Dye Other (See Comments)    Dye in eye test    Review of Systems   Active sinusitis-bronchitis with wet cough productive cough and sneezing and nasal congestion Pending appointment for eye exam with history of 6 previous eye procedures No active dental complaints No abdominal pain No syncope No bleeding No shocks from his best AICD  BP 130/70   Pulse 74   Resp 20   Ht 5' 9.5" (1.765 m)   Wt 202 lb (91.6 kg)   SpO2 96% Comment: RA  BMI 29.40 kg/m  Physical Exam      Exam    General- alert and comfortable    Neck- no JVD, no cervical adenopathy palpable, no carotid bruit   Lungs- clear without rales, wheezes   Cor- regular rate and rhythm, no murmur , gallop   Abdomen- soft, non-tender   Extremities - warm, non-tender, minimal edema   Neuro- oriented,  appropriate, no focal weakness   Diagnostic Tests: Echocardiogram images personally reviewed and counseled with patient  Impression: Ischemic cardiomyopathy, COPD with long smoking history and PFTs pending.  Plan: Patient would benefit from CABG but it would be at high risk because of his poor LV function and pulmonary status.  He is not currently ready to have surgery scheduled because of active pulmonary disease.  He will finish his Z-Pak and return in 2 weeks to discuss potential surgery.   Len Childs, MD [facility] [phone]

## 2018-02-27 ENCOUNTER — Other Ambulatory Visit: Payer: Self-pay | Admitting: Cardiothoracic Surgery

## 2018-02-27 DIAGNOSIS — J189 Pneumonia, unspecified organism: Secondary | ICD-10-CM

## 2018-03-01 ENCOUNTER — Ambulatory Visit (INDEPENDENT_AMBULATORY_CARE_PROVIDER_SITE_OTHER): Payer: Medicare Other | Admitting: Cardiothoracic Surgery

## 2018-03-01 ENCOUNTER — Other Ambulatory Visit: Payer: Self-pay

## 2018-03-01 ENCOUNTER — Encounter: Payer: Self-pay | Admitting: Cardiothoracic Surgery

## 2018-03-01 ENCOUNTER — Ambulatory Visit
Admission: RE | Admit: 2018-03-01 | Discharge: 2018-03-01 | Disposition: A | Discharge status: Home / Self Care | Payer: Medicare Other | Source: Ambulatory Visit | Attending: Cardiothoracic Surgery | Admitting: Cardiothoracic Surgery

## 2018-03-01 VITALS — BP 133/59 | HR 67 | Resp 16 | Ht 69.5 in | Wt 204.6 lb

## 2018-03-01 DIAGNOSIS — J439 Emphysema, unspecified: Secondary | ICD-10-CM | POA: Diagnosis not present

## 2018-03-01 DIAGNOSIS — I2510 Atherosclerotic heart disease of native coronary artery without angina pectoris: Secondary | ICD-10-CM

## 2018-03-01 DIAGNOSIS — I255 Ischemic cardiomyopathy: Secondary | ICD-10-CM

## 2018-03-01 DIAGNOSIS — J189 Pneumonia, unspecified organism: Secondary | ICD-10-CM

## 2018-03-01 MED ORDER — PREDNISONE 10 MG (21) PO TBPK: ORAL_TABLET | ORAL | 0 refills | Status: DC

## 2018-03-01 NOTE — Progress Notes (Signed)
PCP is System, Pcp Not In Referring Provider is Larey Dresser, MD  Chief Complaint  Patient presents with  . Follow-up    with a CXR to assess pulmonary status    HPI: Patient with ischemic cardiomyopathy, severe coronary disease and EF 25%.  He has been medically managed by Dr. Aundra Dubin and viability data and right heart cath data indicate he would benefit from CABG.  However the patient has severe long-standing lung disease from years of smoking and currently has probable hayfever- sinusitis with cough sneezing and chest x-ray today shows possible bronchitis versus scarring from chronic lung disease.  Because one significant risk to this patient with respect to CABG is pulmonary risk his pulmonary status should be optimized.  Last month his symptoms were significantly better.  He was given a Z-Pak at last visit but there is been no significant change.  I will give the patient a prednisone taper, see him back in a week and then hopefully scheduled surgery.  PFTs are still pending prior to surgery.  Past Medical History:  Diagnosis Date  . Childhood asthma   . Hay fever   . Macular hole of left eye 12/23/2011  . Macular hole, right eye 03/05/2014  . Neuromuscular disorder (HCC)    numbness in feet  . PONV (postoperative nausea and vomiting)   . 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 Sinus headache    "hay fever season; not that often"  . Transverse myelitis ([place]) 2009   was treated by Dr Erling Cruz  . Umbilical hernia     Past Surgical History:  Procedure Laterality Date  . [place] VITRECTOMY WITH 20 GAUGE MVR PORT FOR MACULAR HOLE Left 03/08/2013   Procedure: 25 GAUGE PARS PLANA VITRECTOMY WITH 20 GAUGE MVR PORT FOR MACULAR HOLE;  Surgeon: Hayden Pedro, MD;  Location: [place];  Service: Ophthalmology;  Laterality: Left;  . 25 GAUGE PARS PLANA VITRECTOMY WITH 20 GAUGE MVR PORT FOR MACULAR HOLE Right 04/02/2014   Procedure: 20-25 GAUGE PARS PLANA VITRECTOMY ;  MEMBRANE PEEL; HEADSCOPE LASER; SERUM PATCH; GAS/FLUID EXCHANGE ;C3F8;  Surgeon: Hayden Pedro, MD;  Location: [place];  Service: Ophthalmology;  Laterality: Right;  . CATARACT EXTRACTION W/ INTRAOCULAR LENS  IMPLANT, BILATERAL Bilateral 2014  . EYE SURGERY    . GAS INSERTION Left 03/08/2013   Procedure: INSERTION OF GAS;  Surgeon: Hayden Pedro, MD;  Location: [place];  Service: Ophthalmology;  Laterality: Left;  C3F8  . GAS INSERTION Right 05/07/2014   Procedure: INSERTION OF GAS (C3F8);  Surgeon: Hayden Pedro, MD;  Location: [facility];  Service: Ophthalmology;  Laterality: Right;  . GAS/FLUID EXCHANGE Right 05/07/2014   Procedure: GAS/FLUID EXCHANGE;  Surgeon: Hayden Pedro, MD;  Location: [place];  Service: Ophthalmology;  Laterality: Right;  . LASER PHOTO ABLATION Right 05/07/2014   Procedure: LASER PHOTO ABLATION;  Surgeon: Hayden Pedro, MD;  Location: [place];  Service: Ophthalmology;  Laterality: Right;  . PARS PLANA VITRECTOMY  01/18/2012   Procedure: PARS PLANA VITRECTOMY WITH 25 GAUGE;  Surgeon: Hayden Pedro, MD;  Location: [place];  Service: Ophthalmology;  Laterality: Left;  25g. ppv for macular hole ; Laser treatment;Serum patch and Gas Injection(C3F8)  . PARS PLANA VITRECTOMY Right 05/07/2014   Procedure: PARS PLANA VITRECTOMY WITH 25 GAUGE;  Surgeon: Hayden Pedro, MD;  Location: [place];  Service: Ophthalmology;  Laterality: Right;  . PARS PLANA VITRECTOMY W/ REPAIR OF MACULAR HOLE  Right 04/02/2014  . PHOTOCOAGULATION WITH LASER Left 03/08/2013   Procedure: PHOTOCOAGULATION WITH LASER;  Surgeon: Hayden Pedro, MD;  Location: [place];  Service: Ophthalmology;  Laterality: Left;  Headscope   . REPAIR OF COMPLEX TRACTION RETINAL DETACHMENT Right 05/07/2014   Procedure: REPAIR OF COMPLEX TRACTION RETINAL DETACHMENT;  Surgeon: Hayden Pedro, MD;  Location: [place];  Service: Ophthalmology;  Laterality: Right;  . RIGHT HEART CATH N/A 01/19/2018   Procedure: RIGHT HEART CATH;  Surgeon: Larey Dresser, MD;  Location: [place] [facility];  Service: Cardiovascular;  Laterality: N/A;  . SCLERAL BUCKLE Right 05/07/2014   Procedure: SCLERAL BUCKLE;  Surgeon: Hayden Pedro, MD;  Location: [facility];  Service: Ophthalmology;  Laterality: Right;    Family History  Problem Relation Age of Onset  . Heart disease Mother   . Cancer Mother   . Heart attack Father     Social History Social History   Tobacco Use  . Smoking status: Former Smoker    Packs/day: 2.00    Years: 52.00    Pack years: 104.00    Types: Cigarettes    Last attempt to quit: 12/18/2017    Years since quitting: 0.2  . Smokeless tobacco: Never Used  Substance Use Topics  . Alcohol use: No  . Drug use: No    Current Outpatient Medications  Medication Sig Dispense Refill  . aspirin 81 MG chewable tablet Chew 81 mg by mouth daily.  0  . atorvastatin (LIPITOR) 40 MG tablet Take 1 tablet (40 mg total) by mouth at bedtime. 90 tablet 3  . carvedilol (COREG) 6.25 MG tablet Take 1 tablet (6.25 mg total) by mouth 2 (two) times daily. 180 tablet 3  . digoxin (LANOXIN) 0.125 MG tablet Take 1 tablet (0.125 mg total) by mouth daily. 30 tablet 6  . furosemide (LASIX) 40 MG tablet Take 40 mg (1 tab) every morning, and 20 mg (1/2 tab) every evening. 45 tablet 6  . isosorbide mononitrate (IMDUR) 30 MG 24 hr tablet Take 1 tablet (30 mg total) by mouth daily. 90 tablet 3  . Polyethyl Glycol-Propyl Glycol (SYSTANE) 0.4-0.3 % SOLN Place 1 drop into both eyes 2 (two) times daily.    . sacubitril-valsartan (ENTRESTO) 24-26 MG Take 1 tablet by mouth 2 (two) times daily. 60 tablet 3  . predniSONE (STERAPRED UNI-PAK 21 TAB) 10 MG (21) TBPK tablet Day 1 5 tablets Day 2 4 tablets Day 3 3 tablets Day 4 2 tablets Day 5 1 tablet 15 tablet 0   No current facility-administered medications for this visit.     Allergies  Allergen Reactions  . Typhoid Vaccines Swelling  . Codeine Other (See Comments)    Puts me to sleep  . Ivp Dye  [Iodinated Diagnostic Agents] Hives, Itching and Other (See Comments)    Face got splotchy.    . Yellow Dye Other (See Comments)    Dye in eye test    Review of Systems  No fever Ambulating without much difficulty No ankle swelling  BP (!) 133/59 (BP Location: Right Arm, Patient Position: Sitting, Cuff Size: Large)   Pulse 67   Resp 16   Ht 5' 9.5" (1.765 m)   Wt 204 lb 9.6 oz (92.8 kg)   SpO2 95% Comment: ON RA  BMI 29.78 kg/m  Physical Exam Scattered rhonchi Heart rate regular  Diagnostic Tests: Chest x-ray images personally reviewed showing COPD  Impression: Ischemic cardiomyopathy.  CABG recommended by his cardiologist.  Patient's pulmonary status still not optimal for sternotomy and CABG.  Plan: I called in a prednisone taper to his drugstore which he will take.  He is not smoking.  I will see him back in a week to discuss scheduling surgery.   Len Childs, MD [facility] [phone]

## 2018-03-02 ENCOUNTER — Encounter (INDEPENDENT_AMBULATORY_CARE_PROVIDER_SITE_OTHER): Payer: Medicare Other | Admitting: Ophthalmology

## 2018-03-02 DIAGNOSIS — H338 Other retinal detachments: Secondary | ICD-10-CM | POA: Diagnosis not present

## 2018-03-02 DIAGNOSIS — H35343 Macular cyst, hole, or pseudohole, bilateral: Secondary | ICD-10-CM

## 2018-03-03 ENCOUNTER — Encounter (HOSPITAL_COMMUNITY): Payer: Self-pay | Admitting: Cardiology

## 2018-03-03 ENCOUNTER — Other Ambulatory Visit: Payer: Self-pay

## 2018-03-03 ENCOUNTER — Ambulatory Visit (HOSPITAL_COMMUNITY)
Admission: RE | Admit: 2018-03-03 | Discharge: 2018-03-03 | Disposition: A | Discharge status: Home / Self Care | Payer: Medicare Other | Source: Ambulatory Visit | Attending: Cardiology | Admitting: Cardiology

## 2018-03-03 VITALS — BP 136/62 | HR 82 | Wt 205.0 lb

## 2018-03-03 DIAGNOSIS — Z7982 Long term (current) use of aspirin: Secondary | ICD-10-CM | POA: Insufficient documentation

## 2018-03-03 DIAGNOSIS — Z809 Family history of malignant neoplasm, unspecified: Secondary | ICD-10-CM | POA: Diagnosis not present

## 2018-03-03 DIAGNOSIS — E785 Hyperlipidemia, unspecified: Secondary | ICD-10-CM | POA: Insufficient documentation

## 2018-03-03 DIAGNOSIS — Z8249 Family history of ischemic heart disease and other diseases of the circulatory system: Secondary | ICD-10-CM | POA: Insufficient documentation

## 2018-03-03 DIAGNOSIS — I2510 Atherosclerotic heart disease of native coronary artery without angina pectoris: Secondary | ICD-10-CM | POA: Diagnosis not present

## 2018-03-03 DIAGNOSIS — I5022 Chronic systolic (congestive) heart failure: Secondary | ICD-10-CM | POA: Diagnosis not present

## 2018-03-03 DIAGNOSIS — I252 Old myocardial infarction: Secondary | ICD-10-CM | POA: Diagnosis not present

## 2018-03-03 DIAGNOSIS — Z79899 Other long term (current) drug therapy: Secondary | ICD-10-CM | POA: Insufficient documentation

## 2018-03-03 DIAGNOSIS — I255 Ischemic cardiomyopathy: Secondary | ICD-10-CM | POA: Insufficient documentation

## 2018-03-03 DIAGNOSIS — Z87891 Personal history of nicotine dependence: Secondary | ICD-10-CM | POA: Diagnosis not present

## 2018-03-03 MED ORDER — SPIRONOLACTONE 25 MG PO TABS: 12.5000 mg | ORAL_TABLET | Freq: Every day | ORAL | 3 refills | Status: DC

## 2018-03-03 NOTE — Progress Notes (Signed)
HF Cardiology: Dr. Aundra Dubin  77 yo with history of CAD and ischemic cardiomyopathy presents for followup of CHF. Patient had no cardiac history prior to 6/19. However, since around 12/18, he had noted exertional dyspnea.  In 6[place]19, [place].  He developed very severe dyspnea and went to the hospital in [place] where he was found to have NSTEMI.  LHC was done, showing chronic occlusions of the LAD and RCA with collaterals.  Echo showed EF 25-30%.  No intervention, he was eventually discharged to come home.  He has been seen by Dr. Prescott Gum for CABG evaluation.  Cardiac MRI in 6/19 showed EF 28% with substantial viability.  RHC was done in 7/19, showing mildly elevated filling pressures but low cardiac output, with CI 1.7.  After RHC, I increased his Lasix and added digoxin.  He is wearing a Lifevest.   Of note, he has quit smoking since 6/19.   Today he returns for HF follow up. Saw Dr Darcey Nora earlier this week and he was started on prednisone for 5 days.  Overall feeling fine. Denies SOB/PND/Orthopnea. No chest pain.  Productive cough with white sputum. Appetite ok. No fever or chills. Wearing Zoll. No problems. Weight at home  202-204 pounds. Taking all medications. Has not smoked.   ECG (6/19, personally reviewed): NSR, LVH with repolarization abnormality.   Labs (7/19): hgb 14.1, K 4.1, creatinine 1.26 Labs (02/06/2018): K 3.9 Creatinine 1.3   PMH: 1. H/o retinal detachment 2. H/o transverse myelitis (remote) 3. CAD: NSTEMI 6/19 in [place]. - LHC (6/19): Chronic total occlusion of LAD and chronic total occlusion of RCA with collaterals.  4. Chronic systolic CHF: Ischemic cardiomyopathy.   - Echo (6/19): LV moderately dilated, EF 25-30%, mild MR.  - Cardiac MRI (6/19): EF 28%, significant viability noted.  - RHC (7/19): mean RA 8, PA 40/16 mean 26, mean PCWP 23, CI 1.7, PVR 0.83 WU  SH: Married, lives in [place].  He quit smoking in 6/19.    Family  History  Problem Relation Age of Onset  . Heart disease Mother   . Cancer Mother   . Heart attack Father    ROS: All systems reviewed and negative except as per HPI.   Current Outpatient Medications  Medication Sig Dispense Refill  . aspirin 81 MG chewable tablet Chew 81 mg by mouth daily.  0  . atorvastatin (LIPITOR) 40 MG tablet Take 1 tablet (40 mg total) by mouth at bedtime. 90 tablet 3  . carvedilol (COREG) 6.25 MG tablet Take 1 tablet (6.25 mg total) by mouth 2 (two) times daily. 180 tablet 3  . digoxin (LANOXIN) 0.125 MG tablet Take 1 tablet (0.125 mg total) by mouth daily. 30 tablet 6  . furosemide (LASIX) 40 MG tablet Take 40 mg (1 tab) every morning, and 20 mg (1/2 tab) every evening. 45 tablet 6  . isosorbide mononitrate (IMDUR) 30 MG 24 hr tablet Take 1 tablet (30 mg total) by mouth daily. 90 tablet 3  . Polyethyl Glycol-Propyl Glycol (SYSTANE) 0.4-0.3 % SOLN Place 1 drop into both eyes 2 (two) times daily.    . predniSONE (STERAPRED UNI-PAK 21 TAB) 10 MG (21) TBPK tablet Day 1 5 tablets Day 2 4 tablets Day 3 3 tablets Day 4 2 tablets Day 5 1 tablet 15 tablet 0  . sacubitril-valsartan (ENTRESTO) 24-26 MG Take 1 tablet by mouth 2 (two) times daily. 60 tablet 3   No current facility-administered medications for this encounter.  BP 136/62   Pulse 82   Wt 93 kg (205 lb)   SpO2 96%   BMI 29.84 kg/m   Wt Readings from Last 3 Encounters:  03/03/18 93 kg (205 lb)  03/01/18 92.8 kg (204 lb 9.6 oz)  02/16/18 91.6 kg (202 lb)   General:  Well appearing. No resp difficulty HEENT: normal Neck: supple. no JVD. Carotids 2+ bilat; no bruits. No lymphadenopathy or thryomegaly appreciated. Cor: PMI nondisplaced. Regular rate & rhythm. No rubs, gallops or murmurs. Life Vest in place.  Lungs: clear Abdomen: soft, nontender, nondistended. No hepatosplenomegaly. No bruits or masses. Good bowel sounds. Extremities: no cyanosis, clubbing, rash, edema Neuro: alert & orientedx3,  cranial nerves grossly intact. moves all 4 extremities w/o difficulty. Affect pleasant  Assessment/Plan: 1. CAD: LHC in 6/19 with CTOs of LAD and RCA.  He has been seen by Dr. Prescott Gum for CABG evaluation and was referred to CHF clinic for evaluation prior to CABG. Of note, cardiac MRI showed significant myocardial viability. Symptomatically, he appears to be doing well despite low output on RHC.   No chest pain.  - Continue ASA 81, statin, and Imdur.  - He will see Dr Georgie Chard again next week.   2. Chronic systolic CHF: Ischemic cardiomyopathy.  Echo in 6/19 with EF 25-20%.  Cardiac MRI in 7/19 with EF 28%, significant viability noted. RHC in 7/19 showed mildly elevated filling pressure and low cardiac output, so Lasix was increased and digoxin was added.   NYHA II. Volume status stable.  - Continue digoxin 0.125, dig level 0.5  -Entresto 24/26 bid.  - Continue Coreg 6.25 mg bid.  - Add 12.5 mg spiro. Check BMET in 7 days.  - He will be wearing a Lifevest until CABG.  - Continue Lasix 40 qam/20 qpm with BMET today.  3. Smoking: He has quit since 6/19.  4. Hyperlipidemia: Had recent lipids.    I have asked HF pharmacy to check on entresto due to expense.  Greater than 50% of the 25 minute visit was spent in counseling/coordination of care regarding disease state education, salt/fluid restriction, sliding scale diuretics, and medication compliance.   Follow up  4-6 weeks.  Anahli Arvanitis NP-C  03/03/2018

## 2018-03-03 NOTE — Patient Instructions (Signed)
START Spironolactone 12.5 mg (1/2 tablet) once daily.  Return in 1-2 weeks for lab work.  _____________________________________________________________ Robert [name] Code: 1600  Follow up 1 month with Dr. Aundra Dubin.  _______________________________________________________________ Robert [name] Code: 1600  Take all medication as prescribed the day of your appointment. Bring all medications with you to your appointment.  Do the following things EVERYDAY: 1) Weigh yourself in the morning before breakfast. Write it down and keep it in a log. 2) Take your medicines as prescribed 3) Eat low salt foods-Limit salt (sodium) to 2000 mg per day.  4) Stay as active as you can everyday 5) Limit all fluids for the day to less than 2 liters

## 2018-03-08 ENCOUNTER — Ambulatory Visit (INDEPENDENT_AMBULATORY_CARE_PROVIDER_SITE_OTHER): Payer: Medicare Other | Admitting: Cardiothoracic Surgery

## 2018-03-08 ENCOUNTER — Encounter: Payer: Self-pay | Admitting: Cardiothoracic Surgery

## 2018-03-08 ENCOUNTER — Other Ambulatory Visit: Payer: Self-pay

## 2018-03-08 VITALS — BP 100/64 | HR 72 | Resp 18 | Ht 69.5 in | Wt 204.0 lb

## 2018-03-08 DIAGNOSIS — I2510 Atherosclerotic heart disease of native coronary artery without angina pectoris: Secondary | ICD-10-CM | POA: Diagnosis not present

## 2018-03-08 DIAGNOSIS — I5021 Acute systolic (congestive) heart failure: Secondary | ICD-10-CM

## 2018-03-08 DIAGNOSIS — I255 Ischemic cardiomyopathy: Secondary | ICD-10-CM

## 2018-03-08 DIAGNOSIS — J189 Pneumonia, unspecified organism: Secondary | ICD-10-CM | POA: Diagnosis not present

## 2018-03-08 NOTE — Progress Notes (Signed)
PCP is System, Pcp Not In Referring Provider is Larey Dresser, MD  Chief Complaint  Patient presents with  . Coronary Artery Disease    1 week f/u further discuss surgery    HPI: Patient returns to assess for CABG. Patient with ischemic cardiomyopathy following MI earlier this summer.  Patient has been treated at the advanced heart failure clinic and optimized.  When I saw him for his initial consultation he was suffering from significant upper respiratory infection.  He has history of heavy smoking and COPD.  His upper respiratory symptoms, especially his cough have not improved and he is ready for scheduling multivessel CABG.  I discussed the procedure CABG in detail with the patient and wife.  I discussed the hospital recovery expected and the potential risks which could prolong hospitalization including risks of stroke, postoperative pneumonia and pulmonary problems especially in light of his COPD, bleeding requiring transfusion, organ failure, wound infection, and death.  They understand and agree to proceed with surgery scheduled for 10/10.   Past Medical History:  Diagnosis Date  . Childhood asthma   . Hay fever   . Macular hole of left eye 12/23/2011  . Macular hole, right eye 03/05/2014  . Neuromuscular disorder (HCC)    numbness in feet  . PONV (postoperative nausea and vomiting)   . 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 Sinus headache    "hay fever season; not that often"  . Transverse myelitis ([place]) 2009   was treated by Dr Erling Cruz  . Umbilical hernia     Past Surgical History:  Procedure Laterality Date  . [place] VITRECTOMY WITH 20 GAUGE MVR PORT FOR MACULAR HOLE Left 03/08/2013   Procedure: 25 GAUGE PARS PLANA VITRECTOMY WITH 20 GAUGE MVR PORT FOR MACULAR HOLE;  Surgeon: Hayden Pedro, MD;  Location: [place] [place];  Service: Ophthalmology;  Laterality: Left;  . 25 GAUGE PARS PLANA VITRECTOMY WITH 20 GAUGE MVR PORT FOR MACULAR HOLE Right  04/02/2014   Procedure: 20-25 GAUGE PARS PLANA VITRECTOMY ; MEMBRANE PEEL; HEADSCOPE LASER; SERUM PATCH; GAS/FLUID EXCHANGE ;C3F8;  Surgeon: Hayden Pedro, MD;  Location: [place];  Service: Ophthalmology;  Laterality: Right;  . CATARACT EXTRACTION W/ INTRAOCULAR LENS  IMPLANT, BILATERAL Bilateral 2014  . EYE SURGERY    . GAS INSERTION Left 03/08/2013   Procedure: INSERTION OF GAS;  Surgeon: Hayden Pedro, MD;  Location: [place];  Service: Ophthalmology;  Laterality: Left;  C3F8  . GAS INSERTION Right 05/07/2014   Procedure: INSERTION OF GAS (C3F8);  Surgeon: Hayden Pedro, MD;  Location: [place];  Service: Ophthalmology;  Laterality: Right;  . GAS/FLUID EXCHANGE Right 05/07/2014   Procedure: GAS/FLUID EXCHANGE;  Surgeon: Hayden Pedro, MD;  Location: [place];  Service: Ophthalmology;  Laterality: Right;  . LASER PHOTO ABLATION Right 05/07/2014   Procedure: LASER PHOTO ABLATION;  Surgeon: Hayden Pedro, MD;  Location: [place];  Service: Ophthalmology;  Laterality: Right;  . PARS PLANA VITRECTOMY  01/18/2012   Procedure: PARS PLANA VITRECTOMY WITH 25 GAUGE;  Surgeon: Hayden Pedro, MD;  Location: [place];  Service: Ophthalmology;  Laterality: Left;  25g. ppv for macular hole ; Laser treatment;Serum patch and Gas Injection(C3F8)  . PARS PLANA VITRECTOMY Right 05/07/2014   Procedure: PARS PLANA VITRECTOMY WITH 25 GAUGE;  Surgeon: Hayden Pedro, MD;  Location: [place];  Service: Ophthalmology;  Laterality: Right;  . PARS PLANA VITRECTOMY W/ REPAIR OF MACULAR HOLE Right 04/02/2014  .  PHOTOCOAGULATION WITH LASER Left 03/08/2013   Procedure: PHOTOCOAGULATION WITH LASER;  Surgeon: Hayden Pedro, MD;  Location: [place];  Service: Ophthalmology;  Laterality: Left;  Headscope   . REPAIR OF COMPLEX TRACTION RETINAL DETACHMENT Right 05/07/2014   Procedure: REPAIR OF COMPLEX TRACTION RETINAL DETACHMENT;  Surgeon: Hayden Pedro, MD;  Location: [place];  Service: Ophthalmology;  Laterality: Right;  . RIGHT HEART CATH N/A  01/19/2018   Procedure: RIGHT HEART CATH;  Surgeon: Larey Dresser, MD;  Location: [place] [facility];  Service: Cardiovascular;  Laterality: N/A;  . SCLERAL BUCKLE Right 05/07/2014   Procedure: SCLERAL BUCKLE;  Surgeon: Hayden Pedro, MD;  Location: [place];  Service: Ophthalmology;  Laterality: Right;    Family History  Problem Relation Age of Onset  . Heart disease Mother   . Cancer Mother   . Heart attack Father     Social History Social History   Tobacco Use  . Smoking status: Former Smoker    Packs/day: 2.00    Years: 52.00    Pack years: 104.00    Types: Cigarettes    Last attempt to quit: 12/18/2017    Years since quitting: 0.2  . Smokeless tobacco: Never Used  Substance Use Topics  . Alcohol use: No  . Drug use: No    Current Outpatient Medications  Medication Sig Dispense Refill  . aspirin 81 MG chewable tablet Chew 81 mg by mouth daily.  0  . atorvastatin (LIPITOR) 40 MG tablet Take 1 tablet (40 mg total) by mouth at bedtime. 90 tablet 3  . carvedilol (COREG) 6.25 MG tablet Take 1 tablet (6.25 mg total) by mouth 2 (two) times daily. 180 tablet 3  . digoxin (LANOXIN) 0.125 MG tablet Take 1 tablet (0.125 mg total) by mouth daily. 30 tablet 6  . furosemide (LASIX) 40 MG tablet Take 40 mg (1 tab) every morning, and 20 mg (1/2 tab) every evening. 45 tablet 6  . isosorbide mononitrate (IMDUR) 30 MG 24 hr tablet Take 1 tablet (30 mg total) by mouth daily. 90 tablet 3  . Polyethyl Glycol-Propyl Glycol (SYSTANE) 0.4-0.3 % SOLN Place 1 drop into both eyes 2 (two) times daily.    . predniSONE (STERAPRED UNI-PAK 21 TAB) 10 MG (21) TBPK tablet Day 1 5 tablets Day 2 4 tablets Day 3 3 tablets Day 4 2 tablets Day 5 1 tablet 15 tablet 0  . sacubitril-valsartan (ENTRESTO) 24-26 MG Take 1 tablet by mouth 2 (two) times daily. 60 tablet 3  . spironolactone (ALDACTONE) 25 MG tablet Take 0.5 tablets (12.5 mg total) by mouth daily. 45 tablet 3   No current facility-administered  medications for this visit.     Allergies  Allergen Reactions  . Typhoid Vaccines Swelling  . Codeine Other (See Comments)    Puts me to sleep  . Ivp Dye [Iodinated Diagnostic Agents] Hives, Itching and Other (See Comments)    Face got splotchy.    . Yellow Dye Other (See Comments)    Dye in eye test    Review of Systems  Cough congestion sneezing all improved. Recently placed on Aldactone in preparation for CABG.  BP 100/64 (BP Location: Right Arm, Patient Position: Sitting, Cuff Size: Normal)   Pulse 72   Resp 18   Ht 5' 9.5" (1.765 m)   Wt 204 lb (92.5 kg)   SpO2 93% Comment: RA  BMI 29.69 kg/m  Physical Exam      Exam    General-  alert and comfortable    Neck- no JVD, no cervical adenopathy palpable, no carotid bruit   Lungs- clear without rales, wheezes   Cor- regular rate and rhythm, no murmur , gallop   Abdomen- soft, non-tender   Extremities - warm, non-tender, minimal edema   Neuro- oriented, appropriate, no focal weakness   Diagnostic Tests: Chest x-ray on August 28 images personally reviewed.  No evidence of infection or effusion  Impression: Ischemic cardiomyopathy three-vessel CAD ejection fraction 20-25% Right heart cath data showing fairly well compensated filling pressures, cardiac index 1.7 Plan: High risk CABG September 20 at [facility], MD [facility] [phone]

## 2018-03-09 ENCOUNTER — Other Ambulatory Visit: Payer: Self-pay | Admitting: *Deleted

## 2018-03-09 DIAGNOSIS — I2510 Atherosclerotic heart disease of native coronary artery without angina pectoris: Secondary | ICD-10-CM

## 2018-03-10 ENCOUNTER — Ambulatory Visit (HOSPITAL_COMMUNITY)
Admission: RE | Admit: 2018-03-10 | Discharge: 2018-03-10 | Disposition: A | Discharge status: Home / Self Care | Payer: Medicare Other | Source: Ambulatory Visit | Attending: Cardiothoracic Surgery | Admitting: Cardiothoracic Surgery

## 2018-03-10 ENCOUNTER — Encounter (HOSPITAL_COMMUNITY)
Admission: RE | Admit: 2018-03-10 | Discharge: 2018-03-10 | Disposition: A | Discharge status: Home / Self Care | Payer: Medicare Other | Source: Ambulatory Visit | Attending: Cardiothoracic Surgery | Admitting: Cardiothoracic Surgery

## 2018-03-10 ENCOUNTER — Ambulatory Visit (HOSPITAL_BASED_OUTPATIENT_CLINIC_OR_DEPARTMENT_OTHER)
Admission: RE | Admit: 2018-03-10 | Discharge: 2018-03-10 | Disposition: A | Discharge status: Home / Self Care | Payer: Medicare Other | Source: Ambulatory Visit | Attending: Cardiothoracic Surgery | Admitting: Cardiothoracic Surgery

## 2018-03-10 ENCOUNTER — Other Ambulatory Visit: Payer: Self-pay

## 2018-03-10 ENCOUNTER — Encounter (HOSPITAL_COMMUNITY): Payer: Self-pay

## 2018-03-10 DIAGNOSIS — I2510 Atherosclerotic heart disease of native coronary artery without angina pectoris: Secondary | ICD-10-CM

## 2018-03-10 DIAGNOSIS — Z01818 Encounter for other preprocedural examination: Secondary | ICD-10-CM | POA: Diagnosis not present

## 2018-03-10 DIAGNOSIS — I447 Left bundle-branch block, unspecified: Secondary | ICD-10-CM | POA: Diagnosis not present

## 2018-03-10 DIAGNOSIS — I6523 Occlusion and stenosis of bilateral carotid arteries: Secondary | ICD-10-CM | POA: Diagnosis not present

## 2018-03-10 DIAGNOSIS — Z951 Presence of aortocoronary bypass graft: Secondary | ICD-10-CM | POA: Insufficient documentation

## 2018-03-10 DIAGNOSIS — R05 Cough: Secondary | ICD-10-CM | POA: Diagnosis not present

## 2018-03-10 DIAGNOSIS — R9431 Abnormal electrocardiogram [ECG] [EKG]: Secondary | ICD-10-CM | POA: Diagnosis not present

## 2018-03-10 HISTORY — DX: Acute myocardial infarction, unspecified: I21.9

## 2018-03-10 HISTORY — DX: Essential (primary) hypertension: I10

## 2018-03-10 HISTORY — DX: Presence of automatic (implantable) cardiac defibrillator: Z95.810

## 2018-03-10 HISTORY — DX: Atherosclerotic heart disease of native coronary artery without angina pectoris: I25.10

## 2018-03-10 LAB — URINALYSIS, ROUTINE W REFLEX MICROSCOPIC
Bilirubin Urine: NEGATIVE
Glucose, UA: >=500 mg/dL — AB
Hgb urine dipstick: NEGATIVE
Ketones, ur: NEGATIVE mg/dL
Leukocytes, UA: NEGATIVE
Nitrite: NEGATIVE
Protein, ur: NEGATIVE mg/dL
Specific Gravity, Urine: 1.017 (ref 1.005–1.030)
pH: 6 (ref 5.0–8.0)

## 2018-03-10 LAB — PULMONARY FUNCTION TEST
DL/VA % pred: 75 %
DL/VA: 3.43 ml/min/mmHg/L
DLCO cor % pred: 57 %
DLCO cor: 18.33 ml/min/mmHg
DLCO unc % pred: 57 %
DLCO unc: 18.22 ml/min/mmHg
FEF 25-75 Post: 1.37 L/sec
FEF 25-75 Pre: 1.4 L/sec
FEF2575-%Change-Post: -2 %
FEF2575-%Pred-Post: 62 %
FEF2575-%Pred-Pre: 63 %
FEV1-%Change-Post: 0 %
FEV1-%Pred-Post: 63 %
FEV1-%Pred-Pre: 63 %
FEV1-Post: 1.91 L
FEV1-Pre: 1.92 L
FEV1FVC-%Change-Post: 5 %
FEV1FVC-%Pred-Pre: 101 %
FEV6-%Change-Post: -4 %
FEV6-%Pred-Post: 62 %
FEV6-%Pred-Pre: 65 %
FEV6-Post: 2.45 L
FEV6-Pre: 2.57 L
FEV6FVC-%Pred-Post: 106 %
FEV6FVC-%Pred-Pre: 106 %
FVC-%Change-Post: -5 %
FVC-%Pred-Post: 58 %
FVC-%Pred-Pre: 62 %
FVC-Post: 2.45 L
FVC-Pre: 2.59 L
Post FEV1/FVC ratio: 78 %
Post FEV6/FVC ratio: 100 %
Pre FEV1/FVC ratio: 74 %
Pre FEV6/FVC Ratio: 100 %
RV % pred: 115 %
RV: 2.91 L
TLC % pred: 84 %
TLC: 5.86 L

## 2018-03-10 LAB — COMPREHENSIVE METABOLIC PANEL
ALT: 26 U/L (ref 0–44)
AST: 20 U/L (ref 15–41)
Albumin: 3.5 g/dL (ref 3.5–5.0)
Alkaline Phosphatase: 69 U/L (ref 38–126)
Anion gap: 9 (ref 5–15)
BUN: 16 mg/dL (ref 8–23)
CO2: 24 mmol/L (ref 22–32)
Calcium: 8.4 mg/dL — ABNORMAL LOW (ref 8.9–10.3)
Chloride: 105 mmol/L (ref 98–111)
Creatinine, Ser: 1.22 mg/dL (ref 0.61–1.24)
GFR calc Af Amer: >60 mL/min (ref >60)
GFR calc non Af Amer: 57 mL/min — ABNORMAL LOW (ref >60)
Glucose, Bld: 257 mg/dL — ABNORMAL HIGH (ref 70–99)
Potassium: 3.7 mmol/L (ref 3.5–5.1)
Sodium: 138 mmol/L (ref 135–145)
Total Bilirubin: 1.4 mg/dL — ABNORMAL HIGH (ref 0.3–1.2)
Total Protein: 6.3 g/dL — ABNORMAL LOW (ref 6.5–8.1)

## 2018-03-10 LAB — PROTIME-INR
INR: 1.07
Prothrombin Time: 13.8 seconds (ref 11.4–15.2)

## 2018-03-10 LAB — SURGICAL PCR SCREEN
MRSA, PCR: NEGATIVE
Staphylococcus aureus: NEGATIVE

## 2018-03-10 LAB — BLOOD GAS, ARTERIAL
Acid-Base Excess: 3 mmol/L — ABNORMAL HIGH (ref 0.0–2.0)
Bicarbonate: 27.1 mmol/L (ref 20.0–28.0)
Drawn by: 421801
FIO2: 21
O2 Saturation: 97.5 %
Patient temperature: 98.6
pCO2 arterial: 41.7 mmHg (ref 32.0–48.0)
pH, Arterial: 7.428 (ref 7.350–7.450)
pO2, Arterial: 94.1 mmHg (ref 83.0–108.0)

## 2018-03-10 LAB — APTT: aPTT: 29 seconds (ref 24–36)

## 2018-03-10 LAB — CBC
HCT: 42.6 % (ref 39.0–52.0)
Hemoglobin: 14.4 g/dL (ref 13.0–17.0)
MCH: 31.7 pg (ref 26.0–34.0)
MCHC: 33.8 g/dL (ref 30.0–36.0)
MCV: 93.8 fL (ref 78.0–100.0)
Platelets: 176 10*3/uL (ref 150–400)
RBC: 4.54 MIL/uL (ref 4.22–5.81)
RDW: 13.2 % (ref 11.5–15.5)
WBC: 17.4 10*3/uL — ABNORMAL HIGH (ref 4.0–10.5)

## 2018-03-10 LAB — HEMOGLOBIN A1C
Hgb A1c MFr Bld: 7.1 % — ABNORMAL HIGH (ref 4.8–5.6)
Mean Plasma Glucose: 157.07 mg/dL

## 2018-03-10 LAB — ABO/RH: ABO/RH(D): B POS

## 2018-03-10 MED ORDER — ALBUTEROL SULFATE (2.5 MG/3ML) 0.083% IN NEBU
2.5000 mg | INHALATION_SOLUTION | Freq: Once | RESPIRATORY_TRACT | Status: AC
  Administered 2018-03-10: 2.5 mg via RESPIRATORY_TRACT

## 2018-03-10 NOTE — Pre-Procedure Instructions (Signed)
Robert [name]  03/10/2018      [id number] - [place], [place] AT [facility] OF [address] [place] [address] Phone: [phone] Fax: [phone]    Your procedure is scheduled on March 16, 2018.  Report to [facility] Admitting at 530 AM.  Call this number if you have problems the morning of surgery:  [phone]   Remember:  Do not eat or drink after midnight.    Take these medicines the morning of surgery with A SIP OF WATER  Carvedilol (coreg) Digoxin (lanoxin) Mucinex-if needed Entresto  Follow your surgeon's instructions on when to hold/resume aspirin.  If no instructions were given call the office to determine how they would like to you take aspirin  7 days prior to surgery STOP taking any Aleve, Naproxen, Ibuprofen, Motrin, Advil, Goody's, BC's, all herbal medications, fish oil, and all vitamins     Contacts, dentures or bridgework may not be worn into surgery.  Leave your suitcase in the car.  After surgery it may be brought to your room.  For patients admitted to the hospital, discharge time will be determined by your treatment team.  Patients discharged the day of surgery will not be allowed to drive home.    [facility]- Preparing For Surgery  Before surgery, you can play an important role. Because skin is not sterile, your skin needs to be as free of germs as possible. You can reduce the number of germs on your skin by washing with CHG (chlorahexidine gluconate) Soap before surgery.  CHG is an antiseptic cleaner which kills germs and bonds with the skin to continue killing germs even after washing.    Oral Hygiene is also important to reduce your risk of infection.  Remember - BRUSH YOUR TEETH THE MORNING OF SURGERY WITH YOUR REGULAR TOOTHPASTE  Please do not use if you have an allergy to CHG or antibacterial soaps. If your skin becomes reddened/irritated stop using the CHG.  Do  not shave (including legs and underarms) for at least 48 hours prior to first CHG shower. It is OK to shave your face.  Please follow these instructions carefully.   1. Shower the NIGHT BEFORE SURGERY and the MORNING OF SURGERY with CHG.   2. If you chose to wash your hair, wash your hair first as usual with your normal shampoo.  3. After you shampoo, rinse your hair and body thoroughly to remove the shampoo.  4. Use CHG as you would any other liquid soap. You can apply CHG directly to the skin and wash gently with a scrungie or a clean washcloth.   5. Apply the CHG Soap to your body ONLY FROM THE NECK DOWN.  Do not use on open wounds or open sores. Avoid contact with your eyes, ears, mouth and genitals (private parts). Wash Face and genitals (private parts)  with your normal soap.  6. Wash thoroughly, paying special attention to the area where your surgery will be performed.  7. Thoroughly rinse your body with warm water from the neck down.  8. DO NOT shower/wash with your normal soap after using and rinsing off the CHG Soap.  9. Pat yourself dry with a CLEAN TOWEL.  10. Wear CLEAN PAJAMAS to bed the night before surgery, wear comfortable clothes the morning of surgery  11. Place CLEAN SHEETS on your bed the night of your first shower and  DO NOT SLEEP WITH PETS.  Day of Surgery:  Do not apply any deodorants/lotions.  Please wear clean clothes to the hospital/surgery center.   Remember to brush your teeth WITH YOUR REGULAR TOOTHPASTE.             Do not wear jewelry  Do not wear lotions, powders, or colognes, or deodorant.  Men may shave face and neck.  Do not bring valuables to the hospital.   [facility] is not responsible for any belongings or valuables.    Please read over the following fact sheets that you were given.

## 2018-03-10 NOTE — Progress Notes (Addendum)
PCP: [name], MD  Cardiologist: Larae Grooms, MD  EKG: obtained today per order  Stress test: 01/2108 in EPIC  ECHO: 01/2018 in EPIC  Cardiac Cath: 01/2018 in EPIC  Chest x-ray: obtained today per order

## 2018-03-10 NOTE — Progress Notes (Signed)
Pre-CABG testing has been completed. 1-39% ICA stenosis bilaterally.  ABI's Right 0.61 Left 1.05  Palmar waveforms Right Palmar waveforms are obliterated with radial compression and remain within normal limits with ulnar compression. Left Palmar waveforms are obliterated with radial compression and remain within normal limits with ulnar compression.  03/10/18 4:18 PM Robert [name] RVT

## 2018-03-10 NOTE — Progress Notes (Addendum)
Called Levonne Spiller, RN and left a confidential voice message informing her of the patient's elevated WBC (17.5) and abnormal urine, A1C and CMP.  Left call back number for any questions. Ryan returned call and said she would inform Dr. Prescott Gum

## 2018-03-13 ENCOUNTER — Other Ambulatory Visit (HOSPITAL_COMMUNITY): Payer: Medicare Other

## 2018-03-13 NOTE — Progress Notes (Signed)
Anesthesia Chart Review:  Case:  [id number] Date/Time:  03/16/18 0715   Procedures:      CORONARY ARTERY BYPASS GRAFTING (CABG) (N/A Chest)     TRANSESOPHAGEAL ECHOCARDIOGRAM (TEE) (N/A )   Anesthesia type:  General   Pre-op diagnosis:  CAD   Location:  [facility] ROOM 17 / [facility]   Surgeon:  Robert Poot, MD      DISCUSSION: 77 yo male former smoker (quit 12/2017) for above procedure. Pertinent hx includes PONV, Peripheral neuropathy, HTN, CAD/MI (NSTEMI 12/2014).  CAD: NSTEMI 6/19 in [place]. - LHC (6/19): Chronic total occlusion of LAD and chronic total occlusion of RCA with collaterals. Chronic systolic CHF: Ischemic cardiomyopathy.   - Echo (6/19): LV moderately dilated, EF 25-30%, mild MR.  - Cardiac MRI (6/19): EF 28%, significant viability noted.  - RHC (7/19): mean RA 8, PA 40/16 mean 26, mean PCWP 23, CI 1.7, PVR 0.83 WU  Pt recently seen by HF clinic and medically optimized for CABG. Per Dr. Lucianne Lei [name]'s note 03/08/2018 the patient his still considered high risk for surgery but is currently optimized.   Anticipate he can proceed a planned barring acute status change.  VS: BP (!) 160/80   Pulse 71   Temp 36.7 C (Oral)   Ht 5' 9.5" (1.765 m)   Wt 92.7 kg   SpO2 95%   BMI 29.74 kg/m   PROVIDERS: System, Pcp Not In  Robert Champagne, MD is HF Cardiologist  Robert [name] is Cardiologist  LABS: Elevated WBC and BG, Dr. Prescott [name] aware. (all labs ordered are listed, but only abnormal results are displayed)  Labs Reviewed  BLOOD GAS, ARTERIAL - Abnormal; Notable for the following components:      Result Value   Acid-Base Excess 3.0 (*)    All other components within normal limits  CBC - Abnormal; Notable for the following components:   WBC 17.4 (*)    All other components within normal limits  COMPREHENSIVE METABOLIC PANEL - Abnormal; Notable for the following components:   Glucose, Bld 257 (*)    Calcium 8.4 (*)    Total Protein 6.3 (*)    Total Bilirubin  1.4 (*)    GFR calc non Af Amer 57 (*)    All other components within normal limits  HEMOGLOBIN A1C - Abnormal; Notable for the following components:   Hgb A1c MFr Bld 7.1 (*)    All other components within normal limits  URINALYSIS, ROUTINE W REFLEX MICROSCOPIC - Abnormal; Notable for the following components:   Glucose, UA >=500 (*)    Bacteria, UA RARE (*)    All other components within normal limits  SURGICAL PCR SCREEN  APTT  PROTIME-INR  TYPE AND SCREEN  ABO/RH     IMAGES: CHEST - 2 VIEW 03/10/2018  COMPARISON:  03/01/2018  FINDINGS: The heart size and mediastinal contours are within normal limits. Both lungs are clear. The visualized skeletal structures are unremarkable.  IMPRESSION: No active cardiopulmonary disease.  Cardiac MRI 12/30/2017: IMPRESSION: 1. Moderate LVE with diffuse hypokinesis worse in the septum and inferior apex EF 28%  2. Full thickness scar in the distal inferior wall and inferior apex. Subendocardial scar in the septum  The anterior wall, septum mid and basal inferior wall all appear viable and patient would appear to benefit from revascularization   EKG: 03/10/2018: Sinus rhythm with with occasional Premature ventricular complexes and Fusion complexes. Possible Left atrial enlargement. Incomplete left bundle branch block. ST & T wave  abnormality, consider inferior ischemia. ST & T wave abnormality, consider anterolateral ischemia.  CV: Carotid US 03/10/2018: Final Interpretation: Right Carotid: Velocities in the right ICA are consistent with a 1-39% stenosis.  Left Carotid: Velocities in the left ICA are consistent with a 1-39% stenosis. Vertebrals: Bilateral vertebral arteries demonstrate antegrade flow.  [place] 01/19/2018: 1. Mildly elevated filling pressures.  2. Mild pulmonary venous hypertension.  3. Cardiac index is low, measured 1.7.   TTE 01/13/2018: Study Conclusions  - Left ventricle: The cavity size was moderately  dilated. Wall   thickness was increased in a pattern of mild LVH. Systolic   function was severely reduced. The estimated ejection fraction   was in the range of 25% to 30%. Diffuse hypokinesis. Severe   hypokinesis of the apical inferior myocardium with moderate   hypokinesis of the remainder of the inferior wall. Features are   consistent with a pseudonormal left ventricular filling pattern,   with concomitant abnormal relaxation and increased filling   pressure (grade 2 diastolic dysfunction). - Aortic valve: Trileaflet; normal thickness leaflets.   Transvalvular velocity was within the normal range. There was no   stenosis. There was no regurgitation. - Mitral valve: Structurally normal valve. Mobility was not   restricted. There was mild regurgitation. - Left atrium: The atrium was moderately dilated. - Atrial septum: There was a patent foramen ovale. - Tricuspid valve: There was trivial regurgitation. - Pulmonic valve: There was trivial regurgitation. - Pulmonary arteries: Systolic pressure unable to be calculated due   to lack of TR jet. - Frequent ectopy throughout study.  Impressions:  - Severely reduced LV function. Trivial valvular disease. Small   PFO. Cannot calculate right sided pressures due to lack of TR   jet.  LHC 12/20/2017 (care everywhere): 1. Uncomplicated left heart catheterization with severe 3 V CAD including  chronic occlusion of LAD, and RCA with mild plaque in left circumflex  artery and significant stenosis involving a large proximal diagonal artery  branch. 2.Abnormal EDP of 19 mmHg.  PLAN: 1. Maximize medical therapy. LifeVest for severe ischemic cardiomyopathy 2. Risk factor modification 3. Follow up with cardiology for consideration of further viability  studies and possible need for CABG  Myocardial perfusion scan 12/19/2017 (care everywhere):  PERFUSION: Gated SPECT and restimaging reveals a moderate to large perfusion abnormality of  the apex,  inferoseptum and inferior segments.  FUNCTIONAL RESULTS (calculated via Gated SPECT)    Stress Image LV EF: 25 %  EDV: 243 ml EDVI: 116 ml/m TID: 1.09  ESV: 182 ml ESVI: 87 ml/m    LV EF & Wall Motion: Severely reduced left ventricular systolic function.  IMPRESSIONS & RECOMMENDATIONS  Myocardial perfusion imaging is abnormal.  There is a moderate to large area of infarction involving the apex,the inferoseptum and inferior segments.  No significant ischemia but consistent with multivessel coronary disease.  Severe LV dyfunction  Past Medical History:  Diagnosis Date  . Childhood asthma   . Coronary artery disease   . Hay fever   . Hypertension   . Macular hole of left eye 12/23/2011  . Macular hole, right eye 03/05/2014  . Myocardial infarction [facility])    June 2019  . Neuromuscular disorder (HCC)    numbness in feet  . PONV (postoperative nausea and vomiting)   . Presence of external cardiac defibrillator 2019  . 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 Sinus headache    "hay fever season; not that often"  . Transverse myelitis ([place])  2009   was treated by Dr Erling Cruz  . Umbilical hernia     Past Surgical History:  Procedure Laterality Date  . [place] VITRECTOMY WITH 20 GAUGE MVR PORT FOR MACULAR HOLE Left 03/08/2013   Procedure: 25 GAUGE PARS PLANA VITRECTOMY WITH 20 GAUGE MVR PORT FOR MACULAR HOLE;  Surgeon: Hayden Pedro, MD;  Location: [facility];  Service: Ophthalmology;  Laterality: Left;  . 25 GAUGE PARS PLANA VITRECTOMY WITH 20 GAUGE MVR PORT FOR MACULAR HOLE Right 04/02/2014   Procedure: 20-25 GAUGE PARS PLANA VITRECTOMY ; MEMBRANE PEEL; HEADSCOPE LASER; SERUM PATCH; GAS/FLUID EXCHANGE ;C3F8;  Surgeon: Hayden Pedro, MD;  Location: [facility];  Service: Ophthalmology;  Laterality: Right;  . CATARACT EXTRACTION W/ INTRAOCULAR LENS  IMPLANT, BILATERAL Bilateral 2014  . EYE SURGERY    . GAS INSERTION Left 03/08/2013    Procedure: INSERTION OF GAS;  Surgeon: Hayden Pedro, MD;  Location: [facility];  Service: Ophthalmology;  Laterality: Left;  C3F8  . GAS INSERTION Right 05/07/2014   Procedure: INSERTION OF GAS (C3F8);  Surgeon: Hayden Pedro, MD;  Location: [facility];  Service: Ophthalmology;  Laterality: Right;  . GAS/FLUID EXCHANGE Right 05/07/2014   Procedure: GAS/FLUID EXCHANGE;  Surgeon: Hayden Pedro, MD;  Location: [facility];  Service: Ophthalmology;  Laterality: Right;  . LASER PHOTO ABLATION Right 05/07/2014   Procedure: LASER PHOTO ABLATION;  Surgeon: Hayden Pedro, MD;  Location: [facility];  Service: Ophthalmology;  Laterality: Right;  . PARS PLANA VITRECTOMY  01/18/2012   Procedure: PARS PLANA VITRECTOMY WITH 25 GAUGE;  Surgeon: Hayden Pedro, MD;  Location: [facility];  Service: Ophthalmology;  Laterality: Left;  25g. ppv for macular hole ; Laser treatment;Serum patch and Gas Injection(C3F8)  . PARS PLANA VITRECTOMY Right 05/07/2014   Procedure: PARS PLANA VITRECTOMY WITH 25 GAUGE;  Surgeon: Hayden Pedro, MD;  Location: [facility];  Service: Ophthalmology;  Laterality: Right;  . P[place] VITRECTOMY W/ REPAIR OF MACULAR HOLE Right 04/02/2014  . PHOTOCOAGULATION WITH LASER Left 03/08/2013   Procedure: PHOTOCOAGULATION WITH LASER;  Surgeon: Hayden Pedro, MD;  Location: [facility];  Service: Ophthalmology;  Laterality: Left;  Headscope   . REPAIR OF COMPLEX TRACTION RETINAL DETACHMENT Right 05/07/2014   Procedure: REPAIR OF COMPLEX TRACTION RETINAL DETACHMENT;  Surgeon: Hayden Pedro, MD;  Location: [facility];  Service: Ophthalmology;  Laterality: Right;  . RIGHT HEART CATH N/A 01/19/2018   Procedure: RIGHT HEART CATH;  Surgeon: Larey Dresser, MD;  Location: [facility];  Service: Cardiovascular;  Laterality: N/A;  . SCLERAL BUCKLE Right 05/07/2014   Procedure: SCLERAL BUCKLE;  Surgeon: Hayden Pedro, MD;  Location: [facility];  Service: Ophthalmology;  Laterality: Right;    MEDICATIONS: . aspirin 81 MG chewable  tablet  . atorvastatin (LIPITOR) 40 MG tablet  . carvedilol (COREG) 6.25 MG tablet  . digoxin (LANOXIN) 0.125 MG tablet  . furosemide (LASIX) 20 MG tablet  . furosemide (LASIX) 40 MG tablet  . isosorbide mononitrate (IMDUR) 30 MG 24 hr tablet  . Phenylephrine-DM-GG-APAP (MUCINEX SINUS-MAX) 5-10-200-325 MG CAPS  . Polyethyl Glycol-Propyl Glycol (SYSTANE) 0.4-0.3 % SOLN  . predniSONE (STERAPRED UNI-PAK 21 TAB) 10 MG (21) TBPK tablet  . sacubitril-valsartan (ENTRESTO) 24-26 MG  . spironolactone (ALDACTONE) 25 MG tablet   No current facility-administered medications for this encounter.      [facility] Short Stay Center/Anesthesiology Phone [phone] 03/13/2018 8:59 AM

## 2018-03-15 MED ORDER — TRANEXAMIC ACID (OHS) BOLUS VIA INFUSION
15.0000 mg/kg | INTRAVENOUS | Status: AC
  Administered 2018-03-16: 1390.5 mg via INTRAVENOUS
  Filled 2018-03-15: qty 1391

## 2018-03-15 MED ORDER — DOPAMINE-DEXTROSE 3.2-5 MG/ML-% IV SOLN
0.0000 ug/kg/min | INTRAVENOUS | Status: DC
  Filled 2018-03-15: qty 250

## 2018-03-15 MED ORDER — VANCOMYCIN HCL 10 G IV SOLR
1500.0000 mg | INTRAVENOUS | Status: AC
  Administered 2018-03-16: 1500 mg via INTRAVENOUS
  Filled 2018-03-15: qty 1500

## 2018-03-15 MED ORDER — MILRINONE LACTATE IN DEXTROSE 20-5 MG/100ML-% IV SOLN
0.1250 ug/kg/min | INTRAVENOUS | Status: AC
  Administered 2018-03-16: .25 ug/kg/min via INTRAVENOUS
  Filled 2018-03-15: qty 100

## 2018-03-15 MED ORDER — MILRINONE LACTATE IN DEXTROSE 20-5 MG/100ML-% IV SOLN
0.3000 ug/kg/min | INTRAVENOUS | Status: DC
  Filled 2018-03-15: qty 100

## 2018-03-15 MED ORDER — EPINEPHRINE PF 1 MG/ML IJ SOLN
0.0000 ug/min | INTRAVENOUS | Status: AC
  Administered 2018-03-16: 3 ug/min via INTRAVENOUS
  Administered 2018-03-16: 2 ug/min via INTRAVENOUS
  Filled 2018-03-15: qty 4

## 2018-03-15 MED ORDER — POTASSIUM CHLORIDE 2 MEQ/ML IV SOLN
80.0000 meq | INTRAVENOUS | Status: DC
  Filled 2018-03-15: qty 40

## 2018-03-15 MED ORDER — DEXMEDETOMIDINE HCL IN NACL 400 MCG/100ML IV SOLN
0.1000 ug/kg/h | INTRAVENOUS | Status: AC
  Administered 2018-03-16: 0.7 ug/kg/h via INTRAVENOUS
  Filled 2018-03-15: qty 100

## 2018-03-15 MED ORDER — SODIUM CHLORIDE 0.9 % IV SOLN
INTRAVENOUS | Status: AC
  Administered 2018-03-16: 1.8 [IU]/h via INTRAVENOUS
  Filled 2018-03-15: qty 1

## 2018-03-15 MED ORDER — PLASMA-LYTE 148 IV SOLN
INTRAVENOUS | Status: AC
  Administered 2018-03-16: 07:00:00
  Filled 2018-03-15: qty 2.5

## 2018-03-15 MED ORDER — MAGNESIUM SULFATE 50 % IJ SOLN
40.0000 meq | INTRAMUSCULAR | Status: DC
  Filled 2018-03-15: qty 9.85

## 2018-03-15 MED ORDER — TRANEXAMIC ACID (OHS) PUMP PRIME SOLUTION
2.0000 mg/kg | INTRAVENOUS | Status: DC
  Filled 2018-03-15 (×2): qty 1.85

## 2018-03-15 MED ORDER — SODIUM CHLORIDE 0.9 % IV SOLN
1.5000 g | INTRAVENOUS | Status: AC
  Administered 2018-03-16: 1.5 g via INTRAVENOUS
  Filled 2018-03-15: qty 1.5

## 2018-03-15 MED ORDER — TRANEXAMIC ACID 1000 MG/10ML IV SOLN
1.5000 mg/kg/h | INTRAVENOUS | Status: AC
  Administered 2018-03-16: 1.5 mg/kg/h via INTRAVENOUS
  Filled 2018-03-15: qty 25

## 2018-03-15 MED ORDER — SODIUM CHLORIDE 0.9 % IV SOLN
750.0000 mg | INTRAVENOUS | Status: DC
  Filled 2018-03-15: qty 750

## 2018-03-15 MED ORDER — NITROGLYCERIN IN D5W 200-5 MCG/ML-% IV SOLN
2.0000 ug/min | INTRAVENOUS | Status: AC
  Administered 2018-03-16: 16.6 ug/min via INTRAVENOUS
  Filled 2018-03-15: qty 250

## 2018-03-15 MED ORDER — SODIUM CHLORIDE 0.9 % IV SOLN
INTRAVENOUS | Status: DC
  Filled 2018-03-15: qty 30

## 2018-03-15 MED ORDER — SODIUM CHLORIDE 0.9 % IV SOLN
30.0000 ug/min | INTRAVENOUS | Status: AC
  Administered 2018-03-16: 50 ug/min via INTRAVENOUS
  Filled 2018-03-15: qty 2

## 2018-03-15 NOTE — Anesthesia Preprocedure Evaluation (Addendum)
Anesthesia Evaluation  Patient identified by MRN, date of birth, ID band Patient awake    Reviewed: Allergy & Precautions, NPO status , Patient's Chart, lab work & pertinent test results, reviewed documented beta blocker date and time   History of Anesthesia Complications (+) PONV  Airway Mallampati: II  TM Distance: >3 FB Neck ROM: Full    Dental  (+) Dental Advisory Given   Pulmonary COPD,  COPD inhaler, former smoker (quit 6/19),    + rhonchi (rhonchi clear with coughing)  + wheezing      Cardiovascular hypertension, Pt. on medications and Pt. on home beta blockers (-) angina+ CAD, + Past MI and +CHF (LifeVest)   Rhythm:Regular Rate:Bradycardia  7/12 /19 ECHO: EF 25-30%, inferior hypokinesis   Neuro/Psych  Neuromuscular disease (h/o transverse myelitis)    GI/Hepatic negative GI ROS, Neg liver ROS,   Endo/Other  negative endocrine ROS  Renal/GU negative Renal ROS     Musculoskeletal   Abdominal   Peds  Hematology negative hematology ROS (+)   Anesthesia Other Findings   Reproductive/Obstetrics                            Anesthesia Physical Anesthesia Plan  ASA: IV  Anesthesia Plan: General   Post-op Pain Management:    Induction: Intravenous  PONV Risk Score and Plan: 3 and Treatment may vary due to age or medical condition  Airway Management Planned: Oral ETT  Additional Equipment: Arterial line, PA Cath, TEE and Ultrasound Guidance Line Placement  Intra-op Plan:   Post-operative Plan: Post-operative intubation/ventilation  Informed Consent: I have reviewed the patients History and Physical, chart, labs and discussed the procedure including the risks, benefits and alternatives for the proposed anesthesia with the patient or authorized representative who has indicated his/her understanding and acceptance.   Dental advisory given  Plan Discussed with: CRNA and  Surgeon  Anesthesia Plan Comments: (Plan routine monitors, A line, PA cath, GETA with TEE and post op ventilation)       Anesthesia Quick Evaluation

## 2018-03-16 ENCOUNTER — Inpatient Hospital Stay (HOSPITAL_COMMUNITY): Payer: Medicare Other | Admitting: Physician Assistant

## 2018-03-16 ENCOUNTER — Inpatient Hospital Stay (HOSPITAL_COMMUNITY): Payer: Medicare Other | Admitting: Certified Registered"

## 2018-03-16 ENCOUNTER — Inpatient Hospital Stay (HOSPITAL_COMMUNITY): Payer: Medicare Other

## 2018-03-16 ENCOUNTER — Other Ambulatory Visit: Payer: Self-pay

## 2018-03-16 ENCOUNTER — Encounter (HOSPITAL_COMMUNITY): Payer: Self-pay | Admitting: Certified Registered"

## 2018-03-16 ENCOUNTER — Inpatient Hospital Stay (HOSPITAL_COMMUNITY)
Admission: RE | Disposition: A | Discharge status: Home / Self Care | Payer: Self-pay | Source: Ambulatory Visit | Attending: Cardiothoracic Surgery

## 2018-03-16 ENCOUNTER — Inpatient Hospital Stay (HOSPITAL_COMMUNITY)
Admission: RE | Admit: 2018-03-16 | Discharge: 2018-03-25 | DRG: 236 | Disposition: A | Discharge status: Home / Self Care | Payer: Medicare Other | Source: Ambulatory Visit | Attending: Cardiothoracic Surgery | Admitting: Cardiothoracic Surgery

## 2018-03-16 DIAGNOSIS — Z951 Presence of aortocoronary bypass graft: Secondary | ICD-10-CM

## 2018-03-16 DIAGNOSIS — Z794 Long term (current) use of insulin: Secondary | ICD-10-CM

## 2018-03-16 DIAGNOSIS — J9811 Atelectasis: Secondary | ICD-10-CM | POA: Diagnosis not present

## 2018-03-16 DIAGNOSIS — Z8249 Family history of ischemic heart disease and other diseases of the circulatory system: Secondary | ICD-10-CM

## 2018-03-16 DIAGNOSIS — N289 Disorder of kidney and ureter, unspecified: Secondary | ICD-10-CM | POA: Diagnosis not present

## 2018-03-16 DIAGNOSIS — I252 Old myocardial infarction: Secondary | ICD-10-CM

## 2018-03-16 DIAGNOSIS — I2510 Atherosclerotic heart disease of native coronary artery without angina pectoris: Secondary | ICD-10-CM | POA: Diagnosis present

## 2018-03-16 DIAGNOSIS — E119 Type 2 diabetes mellitus without complications: Secondary | ICD-10-CM | POA: Diagnosis present

## 2018-03-16 DIAGNOSIS — I110 Hypertensive heart disease with heart failure: Secondary | ICD-10-CM | POA: Diagnosis present

## 2018-03-16 DIAGNOSIS — Z91041 Radiographic dye allergy status: Secondary | ICD-10-CM | POA: Diagnosis not present

## 2018-03-16 DIAGNOSIS — I5022 Chronic systolic (congestive) heart failure: Secondary | ICD-10-CM | POA: Diagnosis present

## 2018-03-16 DIAGNOSIS — Z79899 Other long term (current) drug therapy: Secondary | ICD-10-CM

## 2018-03-16 DIAGNOSIS — D62 Acute posthemorrhagic anemia: Secondary | ICD-10-CM | POA: Diagnosis not present

## 2018-03-16 DIAGNOSIS — F17210 Nicotine dependence, cigarettes, uncomplicated: Secondary | ICD-10-CM | POA: Diagnosis present

## 2018-03-16 DIAGNOSIS — I454 Nonspecific intraventricular block: Secondary | ICD-10-CM | POA: Diagnosis present

## 2018-03-16 DIAGNOSIS — Z885 Allergy status to narcotic agent status: Secondary | ICD-10-CM | POA: Diagnosis not present

## 2018-03-16 DIAGNOSIS — E876 Hypokalemia: Secondary | ICD-10-CM | POA: Diagnosis not present

## 2018-03-16 DIAGNOSIS — I255 Ischemic cardiomyopathy: Secondary | ICD-10-CM | POA: Diagnosis present

## 2018-03-16 DIAGNOSIS — D72829 Elevated white blood cell count, unspecified: Secondary | ICD-10-CM | POA: Diagnosis not present

## 2018-03-16 DIAGNOSIS — Z09 Encounter for follow-up examination after completed treatment for conditions other than malignant neoplasm: Secondary | ICD-10-CM

## 2018-03-16 DIAGNOSIS — Z7982 Long term (current) use of aspirin: Secondary | ICD-10-CM

## 2018-03-16 DIAGNOSIS — I10 Essential (primary) hypertension: Secondary | ICD-10-CM | POA: Diagnosis not present

## 2018-03-16 DIAGNOSIS — Z888 Allergy status to other drugs, medicaments and biological substances status: Secondary | ICD-10-CM | POA: Diagnosis not present

## 2018-03-16 DIAGNOSIS — J301 Allergic rhinitis due to pollen: Secondary | ICD-10-CM | POA: Diagnosis present

## 2018-03-16 DIAGNOSIS — D6959 Other secondary thrombocytopenia: Secondary | ICD-10-CM | POA: Diagnosis not present

## 2018-03-16 DIAGNOSIS — I5043 Acute on chronic combined systolic (congestive) and diastolic (congestive) heart failure: Secondary | ICD-10-CM | POA: Diagnosis not present

## 2018-03-16 DIAGNOSIS — J811 Chronic pulmonary edema: Secondary | ICD-10-CM | POA: Diagnosis not present

## 2018-03-16 DIAGNOSIS — I340 Nonrheumatic mitral (valve) insufficiency: Secondary | ICD-10-CM | POA: Diagnosis not present

## 2018-03-16 DIAGNOSIS — I513 Intracardiac thrombosis, not elsewhere classified: Secondary | ICD-10-CM | POA: Diagnosis present

## 2018-03-16 DIAGNOSIS — J90 Pleural effusion, not elsewhere classified: Secondary | ICD-10-CM | POA: Diagnosis not present

## 2018-03-16 HISTORY — PX: CORONARY ARTERY BYPASS GRAFT: SHX141

## 2018-03-16 HISTORY — PX: TEE WITHOUT CARDIOVERSION: SHX5443

## 2018-03-16 LAB — POCT I-STAT, CHEM 8
BUN: 13 mg/dL (ref 8–23)
BUN: 12 mg/dL (ref 8–23)
BUN: 15 mg/dL (ref 8–23)
BUN: 13 mg/dL (ref 8–23)
BUN: 13 mg/dL (ref 8–23)
Calcium, Ion: 1.15 mmol/L (ref 1.15–1.40)
Calcium, Ion: 0.92 mmol/L — ABNORMAL LOW (ref 1.15–1.40)
Calcium, Ion: 1.14 mmol/L — ABNORMAL LOW (ref 1.15–1.40)
Calcium, Ion: 1 mmol/L — ABNORMAL LOW (ref 1.15–1.40)
Calcium, Ion: 1.03 mmol/L — ABNORMAL LOW (ref 1.15–1.40)
Chloride: 103 mmol/L (ref 98–111)
Chloride: 100 mmol/L (ref 98–111)
Chloride: 103 mmol/L (ref 98–111)
Chloride: 101 mmol/L (ref 98–111)
Chloride: 101 mmol/L (ref 98–111)
Creatinine, Ser: 1 mg/dL (ref 0.61–1.24)
Creatinine, Ser: 1 mg/dL (ref 0.61–1.24)
Creatinine, Ser: 1.2 mg/dL (ref 0.61–1.24)
Creatinine, Ser: 1.2 mg/dL (ref 0.61–1.24)
Creatinine, Ser: 1 mg/dL (ref 0.61–1.24)
Glucose, Bld: 152 mg/dL — ABNORMAL HIGH (ref 70–99)
Glucose, Bld: 130 mg/dL — ABNORMAL HIGH (ref 70–99)
Glucose, Bld: 147 mg/dL — ABNORMAL HIGH (ref 70–99)
Glucose, Bld: 160 mg/dL — ABNORMAL HIGH (ref 70–99)
Glucose, Bld: 195 mg/dL — ABNORMAL HIGH (ref 70–99)
HCT: 32 % — ABNORMAL LOW (ref 39.0–52.0)
HCT: 25 % — ABNORMAL LOW (ref 39.0–52.0)
HCT: 31 % — ABNORMAL LOW (ref 39.0–52.0)
HCT: 26 % — ABNORMAL LOW (ref 39.0–52.0)
HCT: 26 % — ABNORMAL LOW (ref 39.0–52.0)
Hemoglobin: 10.9 g/dL — ABNORMAL LOW (ref 13.0–17.0)
Hemoglobin: 10.5 g/dL — ABNORMAL LOW (ref 13.0–17.0)
Hemoglobin: 8.5 g/dL — ABNORMAL LOW (ref 13.0–17.0)
Hemoglobin: 8.8 g/dL — ABNORMAL LOW (ref 13.0–17.0)
Hemoglobin: 8.8 g/dL — ABNORMAL LOW (ref 13.0–17.0)
Potassium: 3.9 mmol/L (ref 3.5–5.1)
Potassium: 4.1 mmol/L (ref 3.5–5.1)
Potassium: 4.2 mmol/L (ref 3.5–5.1)
Potassium: 4.9 mmol/L (ref 3.5–5.1)
Potassium: 4.4 mmol/L (ref 3.5–5.1)
Sodium: 140 mmol/L (ref 135–145)
Sodium: 141 mmol/L (ref 135–145)
Sodium: 141 mmol/L (ref 135–145)
Sodium: 136 mmol/L (ref 135–145)
Sodium: 138 mmol/L (ref 135–145)
TCO2: 29 mmol/L (ref 22–32)
TCO2: 26 mmol/L (ref 22–32)
TCO2: 29 mmol/L (ref 22–32)
TCO2: 28 mmol/L (ref 22–32)
TCO2: 25 mmol/L (ref 22–32)

## 2018-03-16 LAB — POCT I-STAT 3, ART BLOOD GAS (G3+)
Acid-Base Excess: 2 mmol/L (ref 0.0–2.0)
Acid-Base Excess: 3 mmol/L — ABNORMAL HIGH (ref 0.0–2.0)
Acid-base deficit: 3 mmol/L — ABNORMAL HIGH (ref 0.0–2.0)
Acid-base deficit: 4 mmol/L — ABNORMAL HIGH (ref 0.0–2.0)
Acid-base deficit: 2 mmol/L (ref 0.0–2.0)
Acid-base deficit: 4 mmol/L — ABNORMAL HIGH (ref 0.0–2.0)
Bicarbonate: 28.2 mmol/L — ABNORMAL HIGH (ref 20.0–28.0)
Bicarbonate: 27.7 mmol/L (ref 20.0–28.0)
Bicarbonate: 22.8 mmol/L (ref 20.0–28.0)
Bicarbonate: 23 mmol/L (ref 20.0–28.0)
Bicarbonate: 24 mmol/L (ref 20.0–28.0)
Bicarbonate: 22.2 mmol/L (ref 20.0–28.0)
O2 Saturation: 100 %
O2 Saturation: 100 %
O2 Saturation: 100 %
O2 Saturation: 99 %
O2 Saturation: 97 %
O2 Saturation: 94 %
Patient temperature: 36.3
Patient temperature: 37.1
Patient temperature: 37.5
TCO2: 30 mmol/L (ref 22–32)
TCO2: 29 mmol/L (ref 22–32)
TCO2: 24 mmol/L (ref 22–32)
TCO2: 24 mmol/L (ref 22–32)
TCO2: 25 mmol/L (ref 22–32)
TCO2: 24 mmol/L (ref 22–32)
pCO2 arterial: 52.3 mmHg — ABNORMAL HIGH (ref 32.0–48.0)
pCO2 arterial: 40.4 mmHg (ref 32.0–48.0)
pCO2 arterial: 41.9 mmHg (ref 32.0–48.0)
pCO2 arterial: 45.9 mmHg (ref 32.0–48.0)
pCO2 arterial: 45.8 mmHg (ref 32.0–48.0)
pCO2 arterial: 45.7 mmHg (ref 32.0–48.0)
pH, Arterial: 7.34 — ABNORMAL LOW (ref 7.350–7.450)
pH, Arterial: 7.444 (ref 7.350–7.450)
pH, Arterial: 7.343 — ABNORMAL LOW (ref 7.350–7.450)
pH, Arterial: 7.304 — ABNORMAL LOW (ref 7.350–7.450)
pH, Arterial: 7.329 — ABNORMAL LOW (ref 7.350–7.450)
pH, Arterial: 7.298 — ABNORMAL LOW (ref 7.350–7.450)
pO2, Arterial: 345 mmHg — ABNORMAL HIGH (ref 83.0–108.0)
pO2, Arterial: 476 mmHg — ABNORMAL HIGH (ref 83.0–108.0)
pO2, Arterial: 249 mmHg — ABNORMAL HIGH (ref 83.0–108.0)
pO2, Arterial: 144 mmHg — ABNORMAL HIGH (ref 83.0–108.0)
pO2, Arterial: 101 mmHg (ref 83.0–108.0)
pO2, Arterial: 83 mmHg (ref 83.0–108.0)

## 2018-03-16 LAB — PROTIME-INR
INR: 1.39
Prothrombin Time: 17 seconds — ABNORMAL HIGH (ref 11.4–15.2)

## 2018-03-16 LAB — GLUCOSE, CAPILLARY
Glucose-Capillary: 144 mg/dL — ABNORMAL HIGH (ref 70–99)
Glucose-Capillary: 100 mg/dL — ABNORMAL HIGH (ref 70–99)
Glucose-Capillary: 138 mg/dL — ABNORMAL HIGH (ref 70–99)
Glucose-Capillary: 129 mg/dL — ABNORMAL HIGH (ref 70–99)
Glucose-Capillary: 135 mg/dL — ABNORMAL HIGH (ref 70–99)
Glucose-Capillary: 140 mg/dL — ABNORMAL HIGH (ref 70–99)
Glucose-Capillary: 137 mg/dL — ABNORMAL HIGH (ref 70–99)
Glucose-Capillary: 155 mg/dL — ABNORMAL HIGH (ref 70–99)
Glucose-Capillary: 158 mg/dL — ABNORMAL HIGH (ref 70–99)

## 2018-03-16 LAB — CREATININE, SERUM
Creatinine, Ser: 1.15 mg/dL (ref 0.61–1.24)
GFR calc Af Amer: >60 mL/min (ref >60)
GFR calc non Af Amer: >60 mL/min (ref >60)

## 2018-03-16 LAB — POCT I-STAT 4, (NA,K, GLUC, HGB,HCT)
Glucose, Bld: 161 mg/dL — ABNORMAL HIGH (ref 70–99)
HCT: 32 % — ABNORMAL LOW (ref 39.0–52.0)
Hemoglobin: 10.9 g/dL — ABNORMAL LOW (ref 13.0–17.0)
Potassium: 4.2 mmol/L (ref 3.5–5.1)
Sodium: 142 mmol/L (ref 135–145)

## 2018-03-16 LAB — CBC
HCT: 32.3 % — ABNORMAL LOW (ref 39.0–52.0)
HCT: 31.4 % — ABNORMAL LOW (ref 39.0–52.0)
Hemoglobin: 11 g/dL — ABNORMAL LOW (ref 13.0–17.0)
Hemoglobin: 10.7 g/dL — ABNORMAL LOW (ref 13.0–17.0)
MCH: 32.2 pg (ref 26.0–34.0)
MCH: 32 pg (ref 26.0–34.0)
MCHC: 34.1 g/dL (ref 30.0–36.0)
MCHC: 34.1 g/dL (ref 30.0–36.0)
MCV: 94.4 fL (ref 78.0–100.0)
MCV: 94 fL (ref 78.0–100.0)
Platelets: 136 10*3/uL — ABNORMAL LOW (ref 150–400)
Platelets: 154 10*3/uL (ref 150–400)
RBC: 3.42 MIL/uL — ABNORMAL LOW (ref 4.22–5.81)
RBC: 3.34 MIL/uL — ABNORMAL LOW (ref 4.22–5.81)
RDW: 13.2 % (ref 11.5–15.5)
RDW: 13.2 % (ref 11.5–15.5)
WBC: 29 10*3/uL — ABNORMAL HIGH (ref 4.0–10.5)
WBC: 25.7 10*3/uL — ABNORMAL HIGH (ref 4.0–10.5)

## 2018-03-16 LAB — MAGNESIUM: Magnesium: 2.9 mg/dL — ABNORMAL HIGH (ref 1.7–2.4)

## 2018-03-16 LAB — PLATELET COUNT: Platelets: 127 10*3/uL — ABNORMAL LOW (ref 150–400)

## 2018-03-16 LAB — PREPARE RBC (CROSSMATCH)

## 2018-03-16 LAB — HEMOGLOBIN AND HEMATOCRIT, BLOOD
HCT: 29 % — ABNORMAL LOW (ref 39.0–52.0)
Hemoglobin: 10.1 g/dL — ABNORMAL LOW (ref 13.0–17.0)

## 2018-03-16 LAB — APTT: aPTT: 34 seconds (ref 24–36)

## 2018-03-16 SURGERY — CORONARY ARTERY BYPASS GRAFTING (CABG)
Anesthesia: General | Site: Chest

## 2018-03-16 MED ORDER — ASPIRIN 81 MG PO CHEW
324.0000 mg | CHEWABLE_TABLET | Freq: Every day | ORAL | Status: DC
  Administered 2018-03-21: 324 mg
  Filled 2018-03-16: qty 4

## 2018-03-16 MED ORDER — SUFENTANIL CITRATE 250 MCG/5ML IV SOLN
INTRAVENOUS | Status: AC
  Filled 2018-03-16: qty 5

## 2018-03-16 MED ORDER — LACTATED RINGERS IV SOLN: 500.0000 mL | Freq: Once | INTRAVENOUS | Status: DC | PRN

## 2018-03-16 MED ORDER — HEPARIN SODIUM (PORCINE) 1000 UNIT/ML IJ SOLN
INTRAMUSCULAR | Status: AC
  Filled 2018-03-16: qty 1

## 2018-03-16 MED ORDER — CHLORHEXIDINE GLUCONATE 0.12% ORAL RINSE (MEDLINE KIT)
15.0000 mL | Freq: Two times a day (BID) | OROMUCOSAL | Status: DC
  Administered 2018-03-16 – 2018-03-17 (×3): 15 mL via OROMUCOSAL

## 2018-03-16 MED ORDER — ALBUTEROL SULFATE HFA 108 (90 BASE) MCG/ACT IN AERS
INHALATION_SPRAY | RESPIRATORY_TRACT | Status: AC
  Filled 2018-03-16: qty 6.7

## 2018-03-16 MED ORDER — PROTAMINE SULFATE 10 MG/ML IV SOLN
INTRAVENOUS | Status: AC
  Filled 2018-03-16: qty 5

## 2018-03-16 MED ORDER — METOPROLOL TARTRATE 12.5 MG HALF TABLET
12.5000 mg | ORAL_TABLET | Freq: Two times a day (BID) | ORAL | Status: DC
  Administered 2018-03-17 – 2018-03-20 (×7): 12.5 mg via ORAL
  Filled 2018-03-16 (×9): qty 1

## 2018-03-16 MED ORDER — DEXMEDETOMIDINE HCL IN NACL 200 MCG/50ML IV SOLN
0.0000 ug/kg/h | INTRAVENOUS | Status: DC
  Administered 2018-03-16: 0.5 ug/kg/h via INTRAVENOUS
  Filled 2018-03-16: qty 50

## 2018-03-16 MED ORDER — PANTOPRAZOLE SODIUM 40 MG PO TBEC
40.0000 mg | DELAYED_RELEASE_TABLET | Freq: Every day | ORAL | Status: DC
  Administered 2018-03-18 – 2018-03-25 (×8): 40 mg via ORAL
  Filled 2018-03-16 (×8): qty 1

## 2018-03-16 MED ORDER — AMIODARONE HCL IN DEXTROSE 360-4.14 MG/200ML-% IV SOLN
30.0000 mg/h | INTRAVENOUS | Status: DC
  Administered 2018-03-17: 30 mg/h via INTRAVENOUS
  Filled 2018-03-16 (×2): qty 200

## 2018-03-16 MED ORDER — ACETAMINOPHEN 160 MG/5ML PO SOLN: 650.0000 mg | Freq: Once | ORAL | Status: AC

## 2018-03-16 MED ORDER — MORPHINE SULFATE (PF) 2 MG/ML IV SOLN
2.0000 mg | INTRAVENOUS | Status: DC | PRN
  Administered 2018-03-17: 2 mg via INTRAVENOUS
  Administered 2018-03-17: 4 mg via INTRAVENOUS
  Administered 2018-03-17: 2 mg via INTRAVENOUS
  Administered 2018-03-17 – 2018-03-18 (×2): 4 mg via INTRAVENOUS
  Filled 2018-03-16: qty 2
  Filled 2018-03-16: qty 1
  Filled 2018-03-16: qty 2
  Filled 2018-03-16: qty 1
  Filled 2018-03-16: qty 2

## 2018-03-16 MED ORDER — FAMOTIDINE IN NACL 20-0.9 MG/50ML-% IV SOLN: 20.0000 mg | Freq: Two times a day (BID) | INTRAVENOUS | Status: AC

## 2018-03-16 MED ORDER — BISACODYL 10 MG RE SUPP: 10.0000 mg | Freq: Every day | RECTAL | Status: DC

## 2018-03-16 MED ORDER — GUAIFENESIN ER 600 MG PO TB12
600.0000 mg | ORAL_TABLET | Freq: Two times a day (BID) | ORAL | Status: DC
  Administered 2018-03-17 – 2018-03-25 (×17): 600 mg via ORAL
  Filled 2018-03-16 (×18): qty 1

## 2018-03-16 MED ORDER — SODIUM CHLORIDE 0.9 % IV SOLN
INTRAVENOUS | Status: DC
  Administered 2018-03-17: 7.5 [IU]/h via INTRAVENOUS
  Filled 2018-03-16 (×3): qty 1

## 2018-03-16 MED ORDER — VECURONIUM BROMIDE 10 MG IV SOLR
INTRAVENOUS | Status: DC | PRN
  Administered 2018-03-16: 5 mg via INTRAVENOUS
  Administered 2018-03-16 (×2): 2 mg via INTRAVENOUS
  Administered 2018-03-16: 5 mg via INTRAVENOUS
  Administered 2018-03-16 (×3): 2 mg via INTRAVENOUS

## 2018-03-16 MED ORDER — ARTIFICIAL TEARS OPHTHALMIC OINT
TOPICAL_OINTMENT | OPHTHALMIC | Status: AC
  Filled 2018-03-16: qty 3.5

## 2018-03-16 MED ORDER — AMIODARONE HCL IN DEXTROSE 360-4.14 MG/200ML-% IV SOLN
60.0000 mg/h | INTRAVENOUS | Status: DC
  Administered 2018-03-16 (×2): 60 mg/h via INTRAVENOUS

## 2018-03-16 MED ORDER — HEPARIN SODIUM (PORCINE) 1000 UNIT/ML IJ SOLN
INTRAMUSCULAR | Status: DC | PRN
  Administered 2018-03-16 (×2): 2000 [IU] via INTRAVENOUS
  Administered 2018-03-16: 28000 [IU] via INTRAVENOUS

## 2018-03-16 MED ORDER — SODIUM CHLORIDE 0.9% FLUSH
3.0000 mL | Freq: Two times a day (BID) | INTRAVENOUS | Status: DC
  Administered 2018-03-17 – 2018-03-25 (×11): 3 mL via INTRAVENOUS

## 2018-03-16 MED ORDER — PROPOFOL 10 MG/ML IV BOLUS
INTRAVENOUS | Status: AC
  Filled 2018-03-16: qty 20

## 2018-03-16 MED ORDER — SODIUM CHLORIDE 0.9 % IV SOLN
1.5000 g | Freq: Two times a day (BID) | INTRAVENOUS | Status: AC
  Administered 2018-03-16 – 2018-03-18 (×4): 1.5 g via INTRAVENOUS
  Filled 2018-03-16 (×4): qty 1.5

## 2018-03-16 MED ORDER — PROTAMINE SULFATE 10 MG/ML IV SOLN
INTRAVENOUS | Status: AC
  Filled 2018-03-16: qty 25

## 2018-03-16 MED ORDER — LACTATED RINGERS IV SOLN: INTRAVENOUS | Status: DC

## 2018-03-16 MED ORDER — ASPIRIN EC 325 MG PO TBEC
325.0000 mg | DELAYED_RELEASE_TABLET | Freq: Every day | ORAL | Status: DC
  Administered 2018-03-17 – 2018-03-25 (×8): 325 mg via ORAL
  Filled 2018-03-16 (×9): qty 1

## 2018-03-16 MED ORDER — SODIUM CHLORIDE 0.45 % IV SOLN
INTRAVENOUS | Status: DC | PRN
  Administered 2018-03-16: 14:00:00 via INTRAVENOUS

## 2018-03-16 MED ORDER — PHENYLEPHRINE HCL-NACL 20-0.9 MG/250ML-% IV SOLN
0.0000 ug/min | INTRAVENOUS | Status: DC
  Administered 2018-03-16: 30 ug/min via INTRAVENOUS
  Administered 2018-03-17: 60 ug/min via INTRAVENOUS
  Filled 2018-03-16 (×3): qty 250

## 2018-03-16 MED ORDER — CHLORHEXIDINE GLUCONATE 0.12 % MT SOLN
OROMUCOSAL | Status: AC
  Filled 2018-03-16: qty 15

## 2018-03-16 MED ORDER — VANCOMYCIN HCL IN DEXTROSE 1-5 GM/200ML-% IV SOLN
1000.0000 mg | Freq: Once | INTRAVENOUS | Status: AC
  Administered 2018-03-16: 1000 mg via INTRAVENOUS
  Filled 2018-03-16 (×2): qty 200

## 2018-03-16 MED ORDER — BISACODYL 5 MG PO TBEC
10.0000 mg | DELAYED_RELEASE_TABLET | Freq: Every day | ORAL | Status: DC
  Administered 2018-03-17 – 2018-03-19 (×3): 10 mg via ORAL
  Filled 2018-03-16 (×6): qty 2

## 2018-03-16 MED ORDER — NOREPINEPHRINE 4 MG/250ML-% IV SOLN
0.0000 ug/min | INTRAVENOUS | Status: DC
  Filled 2018-03-16: qty 250

## 2018-03-16 MED ORDER — LACTATED RINGERS IV SOLN
INTRAVENOUS | Status: DC | PRN
  Administered 2018-03-16 (×5): via INTRAVENOUS

## 2018-03-16 MED ORDER — ALBUMIN HUMAN 5 % IV SOLN
INTRAVENOUS | Status: DC | PRN
  Administered 2018-03-16 (×2): via INTRAVENOUS

## 2018-03-16 MED ORDER — PROTAMINE SULFATE 10 MG/ML IV SOLN
INTRAVENOUS | Status: DC | PRN
  Administered 2018-03-16: 20 mg via INTRAVENOUS
  Administered 2018-03-16: 300 mg via INTRAVENOUS

## 2018-03-16 MED ORDER — LEVALBUTEROL HCL 1.25 MG/0.5ML IN NEBU
1.2500 mg | INHALATION_SOLUTION | Freq: Four times a day (QID) | RESPIRATORY_TRACT | Status: DC
  Administered 2018-03-16 (×2): 1.25 mg via RESPIRATORY_TRACT
  Filled 2018-03-16 (×2): qty 0.5

## 2018-03-16 MED ORDER — METOPROLOL TARTRATE 5 MG/5ML IV SOLN
2.5000 mg | INTRAVENOUS | Status: DC | PRN
  Administered 2018-03-20 – 2018-03-21 (×2): 5 mg via INTRAVENOUS
  Filled 2018-03-16 (×2): qty 5

## 2018-03-16 MED ORDER — ALBUTEROL SULFATE HFA 108 (90 BASE) MCG/ACT IN AERS
INHALATION_SPRAY | RESPIRATORY_TRACT | Status: DC | PRN
  Administered 2018-03-16: 2 via RESPIRATORY_TRACT

## 2018-03-16 MED ORDER — INSULIN REGULAR BOLUS VIA INFUSION
0.0000 [IU] | Freq: Three times a day (TID) | INTRAVENOUS | Status: DC
  Filled 2018-03-16: qty 10

## 2018-03-16 MED ORDER — DOCUSATE SODIUM 100 MG PO CAPS
200.0000 mg | ORAL_CAPSULE | Freq: Every day | ORAL | Status: DC
  Administered 2018-03-17 – 2018-03-19 (×3): 200 mg via ORAL
  Filled 2018-03-16 (×6): qty 2

## 2018-03-16 MED ORDER — ONDANSETRON HCL 4 MG/2ML IJ SOLN: 4.0000 mg | Freq: Four times a day (QID) | INTRAMUSCULAR | Status: DC | PRN

## 2018-03-16 MED ORDER — SODIUM CHLORIDE 0.9% FLUSH: 3.0000 mL | INTRAVENOUS | Status: DC | PRN

## 2018-03-16 MED ORDER — CHLORHEXIDINE GLUCONATE 0.12 % MT SOLN
15.0000 mL | OROMUCOSAL | Status: AC
  Administered 2018-03-16: 15 mL via OROMUCOSAL

## 2018-03-16 MED ORDER — ALBUMIN HUMAN 5 % IV SOLN
250.0000 mL | INTRAVENOUS | Status: AC | PRN
  Administered 2018-03-16 (×4): 12.5 g via INTRAVENOUS
  Filled 2018-03-16 (×2): qty 250

## 2018-03-16 MED ORDER — OXYCODONE HCL 5 MG PO TABS
5.0000 mg | ORAL_TABLET | ORAL | Status: DC | PRN
  Administered 2018-03-17 – 2018-03-21 (×9): 10 mg via ORAL
  Filled 2018-03-16: qty 1
  Filled 2018-03-16 (×9): qty 2

## 2018-03-16 MED ORDER — CHLORHEXIDINE GLUCONATE 4 % EX LIQD: 30.0000 mL | CUTANEOUS | Status: DC

## 2018-03-16 MED ORDER — SODIUM CHLORIDE 0.9 % IV SOLN
INTRAVENOUS | Status: DC | PRN
  Administered 2018-03-16: 750 mg via INTRAVENOUS

## 2018-03-16 MED ORDER — ACETAMINOPHEN 500 MG PO TABS
1000.0000 mg | ORAL_TABLET | Freq: Four times a day (QID) | ORAL | Status: AC
  Administered 2018-03-17 – 2018-03-21 (×17): 1000 mg via ORAL
  Filled 2018-03-16 (×18): qty 2

## 2018-03-16 MED ORDER — METOPROLOL TARTRATE 12.5 MG HALF TABLET: 12.5000 mg | ORAL_TABLET | Freq: Once | ORAL | Status: DC

## 2018-03-16 MED ORDER — 0.9 % SODIUM CHLORIDE (POUR BTL) OPTIME
TOPICAL | Status: DC | PRN
  Administered 2018-03-16: 5000 mL

## 2018-03-16 MED ORDER — MORPHINE SULFATE (PF) 2 MG/ML IV SOLN
1.0000 mg | INTRAVENOUS | Status: AC | PRN
  Administered 2018-03-16: 2 mg via INTRAVENOUS
  Filled 2018-03-16: qty 1

## 2018-03-16 MED ORDER — MIDAZOLAM HCL 2 MG/2ML IJ SOLN: 2.0000 mg | INTRAMUSCULAR | Status: DC | PRN

## 2018-03-16 MED ORDER — SODIUM CHLORIDE 0.9 % IV SOLN
INTRAVENOUS | Status: DC
  Administered 2018-03-16: 14:00:00 via INTRAVENOUS

## 2018-03-16 MED ORDER — ACETAMINOPHEN 650 MG RE SUPP
650.0000 mg | Freq: Once | RECTAL | Status: AC
  Administered 2018-03-16: 650 mg via RECTAL

## 2018-03-16 MED ORDER — EPINEPHRINE PF 1 MG/ML IJ SOLN
0.0000 ug/min | INTRAVENOUS | Status: DC
  Filled 2018-03-16: qty 4

## 2018-03-16 MED ORDER — METOPROLOL TARTRATE 25 MG/10 ML ORAL SUSPENSION: 12.5000 mg | Freq: Two times a day (BID) | ORAL | Status: DC

## 2018-03-16 MED ORDER — CHLORHEXIDINE GLUCONATE 0.12 % MT SOLN: 15.0000 mL | Freq: Once | OROMUCOSAL | Status: DC

## 2018-03-16 MED ORDER — HEMOSTATIC AGENTS (NO CHARGE) OPTIME
TOPICAL | Status: DC | PRN
  Administered 2018-03-16 (×4): 1 via TOPICAL

## 2018-03-16 MED ORDER — POTASSIUM CHLORIDE 10 MEQ/50ML IV SOLN: 10.0000 meq | INTRAVENOUS | Status: AC

## 2018-03-16 MED ORDER — MOMETASONE FURO-FORMOTEROL FUM 200-5 MCG/ACT IN AERO
2.0000 | INHALATION_SPRAY | Freq: Two times a day (BID) | RESPIRATORY_TRACT | Status: DC
  Administered 2018-03-16 – 2018-03-25 (×17): 2 via RESPIRATORY_TRACT
  Filled 2018-03-16 (×2): qty 8.8

## 2018-03-16 MED ORDER — ARTIFICIAL TEARS OPHTHALMIC OINT
TOPICAL_OINTMENT | OPHTHALMIC | Status: DC | PRN
  Administered 2018-03-16: 1 via OPHTHALMIC

## 2018-03-16 MED ORDER — VECURONIUM BROMIDE 10 MG IV SOLR
INTRAVENOUS | Status: AC
  Filled 2018-03-16: qty 10

## 2018-03-16 MED ORDER — SUFENTANIL CITRATE 50 MCG/ML IV SOLN
INTRAVENOUS | Status: DC | PRN
  Administered 2018-03-16: 40 ug via INTRAVENOUS
  Administered 2018-03-16: 35 ug via INTRAVENOUS
  Administered 2018-03-16: 5 ug via INTRAVENOUS
  Administered 2018-03-16: 30 ug via INTRAVENOUS
  Administered 2018-03-16: 40 ug via INTRAVENOUS

## 2018-03-16 MED ORDER — PROPOFOL 10 MG/ML IV BOLUS
INTRAVENOUS | Status: DC | PRN
  Administered 2018-03-16: 20 mg via INTRAVENOUS

## 2018-03-16 MED ORDER — SODIUM CHLORIDE 0.9 % IV SOLN: 250.0000 mL | INTRAVENOUS | Status: DC

## 2018-03-16 MED ORDER — AMIODARONE HCL IN DEXTROSE 360-4.14 MG/200ML-% IV SOLN
INTRAVENOUS | Status: AC
  Filled 2018-03-16: qty 200

## 2018-03-16 MED ORDER — MIDAZOLAM HCL 5 MG/5ML IJ SOLN
INTRAMUSCULAR | Status: DC | PRN
  Administered 2018-03-16 (×3): 2 mg via INTRAVENOUS
  Administered 2018-03-16: 3 mg via INTRAVENOUS
  Administered 2018-03-16: 1 mg via INTRAVENOUS

## 2018-03-16 MED ORDER — AMIODARONE IV BOLUS ONLY 150 MG/100ML
150.0000 mg | Freq: Once | INTRAVENOUS | Status: AC
  Administered 2018-03-16: 150 mg via INTRAVENOUS

## 2018-03-16 MED ORDER — MAGNESIUM SULFATE 4 GM/100ML IV SOLN
4.0000 g | Freq: Once | INTRAVENOUS | Status: AC
  Administered 2018-03-16: 4 g via INTRAVENOUS
  Filled 2018-03-16: qty 100

## 2018-03-16 MED ORDER — LEVALBUTEROL HCL 1.25 MG/0.5ML IN NEBU
1.2500 mg | INHALATION_SOLUTION | Freq: Four times a day (QID) | RESPIRATORY_TRACT | Status: DC
  Administered 2018-03-17 – 2018-03-18 (×7): 1.25 mg via RESPIRATORY_TRACT
  Filled 2018-03-16 (×8): qty 0.5

## 2018-03-16 MED ORDER — MIDAZOLAM HCL 10 MG/2ML IJ SOLN
INTRAMUSCULAR | Status: AC
  Filled 2018-03-16: qty 2

## 2018-03-16 MED ORDER — ORAL CARE MOUTH RINSE
15.0000 mL | OROMUCOSAL | Status: DC
  Administered 2018-03-16 – 2018-03-17 (×7): 15 mL via OROMUCOSAL

## 2018-03-16 MED ORDER — NOREPINEPHRINE BITARTRATE 1 MG/ML IV SOLN
INTRAVENOUS | Status: DC | PRN
  Administered 2018-03-16: 2 ug/min via INTRAVENOUS

## 2018-03-16 MED ORDER — ACETAMINOPHEN 160 MG/5ML PO SOLN
1000.0000 mg | Freq: Four times a day (QID) | ORAL | Status: AC
  Filled 2018-03-16: qty 40.6

## 2018-03-16 MED ORDER — MILRINONE LACTATE IN DEXTROSE 20-5 MG/100ML-% IV SOLN
0.1250 ug/kg/min | INTRAVENOUS | Status: DC
  Administered 2018-03-16 – 2018-03-17 (×3): 0.3 ug/kg/min via INTRAVENOUS
  Administered 2018-03-18 – 2018-03-19 (×3): 0.25 ug/kg/min via INTRAVENOUS
  Administered 2018-03-20: 0.125 ug/kg/min via INTRAVENOUS
  Filled 2018-03-16 (×8): qty 100

## 2018-03-16 MED ORDER — NITROGLYCERIN IN D5W 200-5 MCG/ML-% IV SOLN: 0.0000 ug/min | INTRAVENOUS | Status: DC

## 2018-03-16 MED ORDER — TRAMADOL HCL 50 MG PO TABS
50.0000 mg | ORAL_TABLET | ORAL | Status: DC | PRN
  Administered 2018-03-18: 50 mg via ORAL
  Administered 2018-03-19 – 2018-03-23 (×2): 100 mg via ORAL
  Filled 2018-03-16: qty 2
  Filled 2018-03-16: qty 1
  Filled 2018-03-16: qty 2

## 2018-03-16 SURGICAL SUPPLY — 99 items
ADAPTER CARDIO PERF ANTE/RETRO (ADAPTER) ×4 IMPLANT
BAG DECANTER FOR FLEXI CONT (MISCELLANEOUS) ×4 IMPLANT
BANDAGE ACE 4X5 VEL STRL LF (GAUZE/BANDAGES/DRESSINGS) ×4 IMPLANT
BANDAGE ACE 6X5 VEL STRL LF (GAUZE/BANDAGES/DRESSINGS) ×4 IMPLANT
BASKET HEART  (ORDER IN 25'S) (MISCELLANEOUS) ×1
BASKET HEART (ORDER IN 25'S) (MISCELLANEOUS) ×1
BASKET HEART (ORDER IN 25S) (MISCELLANEOUS) ×2 IMPLANT
BLADE CLIPPER SURG (BLADE) IMPLANT
BLADE NEEDLE 3 SS STRL (BLADE) ×3 IMPLANT
BLADE NEEDLE 3MM SS STRL (BLADE) ×1
BLADE STERNUM SYSTEM 6 (BLADE) ×4 IMPLANT
BLADE SURG 12 STRL SS (BLADE) ×4 IMPLANT
BNDG GAUZE ELAST 4 BULKY (GAUZE/BANDAGES/DRESSINGS) ×4 IMPLANT
CANISTER SUCT 3000ML PPV (MISCELLANEOUS) ×4 IMPLANT
CANNULA GUNDRY RCSP 15FR (MISCELLANEOUS) ×4 IMPLANT
CANNULA SUMP PERICARDIAL (CANNULA) ×4 IMPLANT
CATH CPB KIT VANTRIGT (MISCELLANEOUS) ×4 IMPLANT
CATH HEART VENT LEFT (CATHETERS) ×2 IMPLANT
CATH ROBINSON RED A/P 18FR (CATHETERS) ×12 IMPLANT
CATH THORACIC 36FR RT ANG (CATHETERS) ×4 IMPLANT
CLIP FOGARTY SPRING 6M (CLIP) ×4 IMPLANT
CLIP VESOCCLUDE SM WIDE 24/CT (CLIP) ×4 IMPLANT
CRADLE DONUT ADULT HEAD (MISCELLANEOUS) ×4 IMPLANT
DRAIN CHANNEL 32F RND 10.7 FF (WOUND CARE) ×4 IMPLANT
DRAPE CARDIOVASCULAR INCISE (DRAPES) ×2
DRAPE SLUSH/WARMER DISC (DRAPES) ×4 IMPLANT
DRAPE SRG 135X102X78XABS (DRAPES) ×2 IMPLANT
DRSG AQUACEL AG ADV 3.5X14 (GAUZE/BANDAGES/DRESSINGS) ×4 IMPLANT
ELECT BLADE 4.0 EZ CLEAN MEGAD (MISCELLANEOUS) ×4
ELECT BLADE 6.5 EXT (BLADE) ×4 IMPLANT
ELECT CAUTERY BLADE 6.4 (BLADE) ×4 IMPLANT
ELECT REM PT RETURN 9FT ADLT (ELECTROSURGICAL) ×8
ELECTRODE BLDE 4.0 EZ CLN MEGD (MISCELLANEOUS) ×2 IMPLANT
ELECTRODE REM PT RTRN 9FT ADLT (ELECTROSURGICAL) ×4 IMPLANT
FELT TEFLON 1X6 (MISCELLANEOUS) ×4 IMPLANT
GAUZE SPONGE 4X4 12PLY STRL (GAUZE/BANDAGES/DRESSINGS) ×8 IMPLANT
GAUZE SPONGE 4X4 12PLY STRL LF (GAUZE/BANDAGES/DRESSINGS) ×8 IMPLANT
GLOVE BIO SURGEON STRL SZ7.5 (GLOVE) ×12 IMPLANT
GOWN STRL REUS W/ TWL LRG LVL3 (GOWN DISPOSABLE) ×8 IMPLANT
GOWN STRL REUS W/TWL LRG LVL3 (GOWN DISPOSABLE) ×8
HEMOSTAT SURGICEL 2X14 (HEMOSTASIS) ×4 IMPLANT
INSERT FOGARTY XLG (MISCELLANEOUS) IMPLANT
KIT BASIN OR (CUSTOM PROCEDURE TRAY) ×4 IMPLANT
KIT SUCTION CATH 14FR (SUCTIONS) ×4 IMPLANT
KIT TURNOVER KIT B (KITS) ×4 IMPLANT
KIT VASOVIEW HEMOPRO VH 3000 (KITS) ×4 IMPLANT
LEAD PACING MYOCARDI (MISCELLANEOUS) ×4 IMPLANT
MARKER GRAFT CORONARY BYPASS (MISCELLANEOUS) ×12 IMPLANT
NS IRRIG 1000ML POUR BTL (IV SOLUTION) ×20 IMPLANT
PACK E OPEN HEART (SUTURE) ×4 IMPLANT
PACK OPEN HEART (CUSTOM PROCEDURE TRAY) ×4 IMPLANT
PAD ARMBOARD 7.5X6 YLW CONV (MISCELLANEOUS) ×8 IMPLANT
PAD ELECT DEFIB RADIOL ZOLL (MISCELLANEOUS) ×4 IMPLANT
PENCIL BUTTON HOLSTER BLD 10FT (ELECTRODE) ×4 IMPLANT
PUNCH AORTIC ROTATE  4.5MM 8IN (MISCELLANEOUS) ×4 IMPLANT
PUNCH AORTIC ROTATE 4.0MM (MISCELLANEOUS) IMPLANT
PUNCH AORTIC ROTATE 4.5MM 8IN (MISCELLANEOUS) IMPLANT
PUNCH AORTIC ROTATE 5MM 8IN (MISCELLANEOUS) IMPLANT
SET CARDIOPLEGIA MPS 5001102 (MISCELLANEOUS) ×4 IMPLANT
SPONGE LAP 18X18 X RAY DECT (DISPOSABLE) ×4 IMPLANT
SPONGE LAP 4X18 RFD (DISPOSABLE) ×4 IMPLANT
SURGIFLO W/THROMBIN 8M KIT (HEMOSTASIS) ×4 IMPLANT
SUT BONE WAX W31G (SUTURE) ×4 IMPLANT
SUT ETHIBOND 2 0 SH (SUTURE) ×4
SUT ETHIBOND 2 0 SH 36X2 (SUTURE) ×4 IMPLANT
SUT MNCRL AB 4-0 PS2 18 (SUTURE) IMPLANT
SUT PROLENE 3 0 RB 1 (SUTURE) ×4 IMPLANT
SUT PROLENE 3 0 SH DA (SUTURE) ×8 IMPLANT
SUT PROLENE 3 0 SH1 36 (SUTURE) ×8 IMPLANT
SUT PROLENE 4 0 RB 1 (SUTURE) ×2
SUT PROLENE 4 0 SH DA (SUTURE) ×4 IMPLANT
SUT PROLENE 4-0 RB1 .5 CRCL 36 (SUTURE) ×2 IMPLANT
SUT PROLENE 5 0 C 1 36 (SUTURE) IMPLANT
SUT PROLENE 6 0 C 1 30 (SUTURE) ×36 IMPLANT
SUT PROLENE 6 0 CC (SUTURE) ×12 IMPLANT
SUT PROLENE 8 0 BV175 6 (SUTURE) IMPLANT
SUT PROLENE BLUE 7 0 (SUTURE) ×12 IMPLANT
SUT SILK  1 MH (SUTURE)
SUT SILK 1 MH (SUTURE) IMPLANT
SUT SILK 2 0 SH CR/8 (SUTURE) ×4 IMPLANT
SUT SILK 3 0 SH CR/8 (SUTURE) IMPLANT
SUT STEEL 6MS V (SUTURE) ×8 IMPLANT
SUT STEEL SZ 6 DBL 3X14 BALL (SUTURE) ×4 IMPLANT
SUT VIC AB 1 CTX 36 (SUTURE) ×4
SUT VIC AB 1 CTX36XBRD ANBCTR (SUTURE) ×4 IMPLANT
SUT VIC AB 2-0 CT1 27 (SUTURE) ×2
SUT VIC AB 2-0 CT1 TAPERPNT 27 (SUTURE) ×2 IMPLANT
SUT VIC AB 2-0 CTX 27 (SUTURE) IMPLANT
SUT VIC AB 3-0 X1 27 (SUTURE) ×4 IMPLANT
SYSTEM SAHARA CHEST DRAIN ATS (WOUND CARE) ×4 IMPLANT
TAPE CLOTH SURG 4X10 WHT LF (GAUZE/BANDAGES/DRESSINGS) ×8 IMPLANT
TAPE PAPER 2X10 WHT MICROPORE (GAUZE/BANDAGES/DRESSINGS) ×4 IMPLANT
TOWEL GREEN STERILE (TOWEL DISPOSABLE) ×4 IMPLANT
TOWEL GREEN STERILE FF (TOWEL DISPOSABLE) ×4 IMPLANT
TRAY FOLEY SLVR 16FR TEMP STAT (SET/KITS/TRAYS/PACK) ×4 IMPLANT
TUBING INSUFFLATION (TUBING) ×4 IMPLANT
UNDERPAD 30X30 (UNDERPADS AND DIAPERS) ×4 IMPLANT
VENT LEFT HEART 12002 (CATHETERS) ×4
WATER STERILE IRR 1000ML POUR (IV SOLUTION) ×8 IMPLANT

## 2018-03-16 NOTE — H&P (Signed)
PCP is System, Pcp Not In Referring Provider is No ref. provider found  No chief complaint on file.   HPI: Patient examined, outside images of cardiac arteriograms at [facility] personally reviewed and counseled with patient.  Outside medical record from last month at [facility] personally reviewed.   77 year old nondiabetic smoker[2 packs/day] was admitted to the hospital in [facility] in mid June with acute respiratory failure, hypertensive crisis, non-STEMI and acute flash pulmonary edema.  His venous lactate was elevated at 3.2 and his PCO2 was 64, base deficit -10.  His chest x-ray showed pulmonary edema.  CT scan of chest showed bilateral pleural effusions with edema, no  lung mass or mediastinal adenopathy.  He was treated with Lasix with improved symptoms.  An echocardiogram showed EF of 25% without significant MR.  No TR.  He subsequently underwent left heart cath showing chronic total occlusion of LAD and chronic total occlusion of the RCA.  He had a large patent obtuse marginal that was free of disease and a smaller proximal marginal which had moderate stenosis.  No LVEDP or right heart pressures available.   The patient denies prior history of heart disease although his father died of an MI at age 29 and his mother had CABG.  The patient was evaluated by cardiology after his return to [facility] and he separately underwent MRI cardiac viability study.  This showed some transmural scarring at the apex and inferior septum but with significant viability in the basal posterior wall and the anterior wall.  Based on his cardiac viability study he has been recommended for CABG.   At the time of his hospitalization in [place] a LifeVest was placed on the patient which she has worn since.  It has never shocked him.  Since his hospitalization   In [place] 6 weeks ago he has not smoked.  He denies recurrent symptoms of exertional or at rest shortness of  breath or chest pain.    Followup office visit  Patient with ischemic cardiomyopathy, severe coronary disease and EF 25%.  He has been medically managed by Dr. Aundra Dubin and viability data and right heart cath data indicate he would benefit from CABG.  However the patient has severe long-standing lung disease from years of smoking and currently has probable hayfever- sinusitis with cough sneezing and chest x-ray today shows possible bronchitis versus scarring from chronic lung disease.  Because one significant risk to this patient with respect to CABG is pulmonary risk his pulmonary status should be optimized.  Last month his symptoms were significantly better.  He was given a Z-Pak at last visit but there is been no significant change.  I will give the patient a prednisone taper, see him back in a week and then hopefully scheduled surgery.  PFTs are still pending prior to surgery.  Followup  Patient with improved cough, congestion after Z-pack then prednisone taper Scheduled for CABG 9-12  Past Medical History:  Diagnosis Date  . Childhood asthma   . Coronary artery disease   . Hay fever   . Hypertension   . Macular hole of left eye 12/23/2011  . Macular hole, right eye 03/05/2014  . Myocardial infarction [facility])    June 2019  . Neuromuscular disorder (HCC)    numbness in feet  . PONV (postoperative nausea and vomiting)   . Presence of external cardiac defibrillator 2019  . 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 Sinus headache    "hay fever season; not  that often"  . Transverse myelitis ([place]) 2009   was treated by Dr Erling Cruz  . Umbilical hernia     Past Surgical History:  Procedure Laterality Date  . [place] VITRECTOMY WITH 20 GAUGE MVR PORT FOR MACULAR HOLE Left 03/08/2013   Procedure: 25 GAUGE PARS PLANA VITRECTOMY WITH 20 GAUGE MVR PORT FOR MACULAR HOLE;  Surgeon: Hayden Pedro, MD;  Location: [place];  Service: Ophthalmology;  Laterality: Left;  . 25  GAUGE PARS PLANA VITRECTOMY WITH 20 GAUGE MVR PORT FOR MACULAR HOLE Right 04/02/2014   Procedure: 20-25 GAUGE PARS PLANA VITRECTOMY ; MEMBRANE PEEL; HEADSCOPE LASER; SERUM PATCH; GAS/FLUID EXCHANGE ;C3F8;  Surgeon: Hayden Pedro, MD;  Location: [place];  Service: Ophthalmology;  Laterality: Right;  . CATARACT EXTRACTION W/ INTRAOCULAR LENS  IMPLANT, BILATERAL Bilateral 2014  . EYE SURGERY    . GAS INSERTION Left 03/08/2013   Procedure: INSERTION OF GAS;  Surgeon: Hayden Pedro, MD;  Location: [place];  Service: Ophthalmology;  Laterality: Left;  C3F8  . GAS INSERTION Right 05/07/2014   Procedure: INSERTION OF GAS (C3F8);  Surgeon: Hayden Pedro, MD;  Location: [place];  Service: Ophthalmology;  Laterality: Right;  . GAS/FLUID EXCHANGE Right 05/07/2014   Procedure: GAS/FLUID EXCHANGE;  Surgeon: Hayden Pedro, MD;  Location: [place];  Service: Ophthalmology;  Laterality: Right;  . LASER PHOTO ABLATION Right 05/07/2014   Procedure: LASER PHOTO ABLATION;  Surgeon: Hayden Pedro, MD;  Location: [place];  Service: Ophthalmology;  Laterality: Right;  . PARS PLANA VITRECTOMY  01/18/2012   Procedure: PARS PLANA VITRECTOMY WITH 25 GAUGE;  Surgeon: Hayden Pedro, MD;  Location: [place];  Service: Ophthalmology;  Laterality: Left;  25g. ppv for macular hole ; Laser treatment;Serum patch and Gas Injection(C3F8)  . PARS PLANA VITRECTOMY Right 05/07/2014   Procedure: PARS PLANA VITRECTOMY WITH 25 GAUGE;  Surgeon: Hayden Pedro, MD;  Location: [place];  Service: Ophthalmology;  Laterality: Right;  . PARS PLANA VITRECTOMY W/ REPAIR OF MACULAR HOLE Right 04/02/2014  . PHOTOCOAGULATION WITH LASER Left 03/08/2013   Procedure: PHOTOCOAGULATION WITH LASER;  Surgeon: Hayden Pedro, MD;  Location: [place];  Service: Ophthalmology;  Laterality: Left;  Headscope   . REPAIR OF COMPLEX TRACTION RETINAL DETACHMENT Right 05/07/2014   Procedure: REPAIR OF COMPLEX TRACTION RETINAL DETACHMENT;  Surgeon: Hayden Pedro, MD;  Location: [place];  Service: Ophthalmology;  Laterality: Right;  . RIGHT HEART CATH N/A 01/19/2018   Procedure: RIGHT HEART CATH;  Surgeon: Larey Dresser, MD;  Location: [place] [facility];  Service: Cardiovascular;  Laterality: N/A;  . SCLERAL BUCKLE Right 05/07/2014   Procedure: SCLERAL BUCKLE;  Surgeon: Hayden Pedro, MD;  Location: [place];  Service: Ophthalmology;  Laterality: Right;    Family History  Problem Relation Age of Onset  . Heart disease Mother   . Cancer Mother   . Heart attack Father     Social History Social History   Tobacco Use  . Smoking status: Former Smoker    Packs/day: 2.00    Years: 52.00    Pack years: 104.00    Types: Cigarettes    Last attempt to quit: 12/18/2017    Years since quitting: 0.2  . Smokeless tobacco: Never Used  Substance Use Topics  . Alcohol use: No  . Drug use: No    Current Facility-Administered Medications  Medication Dose Route Frequency Provider Last Rate Last Dose  . 0.9 %  irrigation (POUR BTL)    PRN Prescott Gum, Collier Salina, MD   5,000 mL at 03/16/18 0659  . cefUROXime (ZINACEF) 1.5 g in sodium chloride 0.9 % 100 mL IVPB  1.5 g Intravenous To OR Prescott Gum, Collier Salina, MD      . cefUROXime (ZINACEF) 750 mg in sodium chloride 0.9 % 100 mL IVPB  750 mg Intravenous To OR Prescott Gum, Collier Salina, MD      . chlorhexidine (HIBICLENS) 4 % liquid 2 application  30 mL Topical UD Prescott Gum, Collier Salina, MD      . Derrill Memo ON 03/17/2018] chlorhexidine (PERIDEX) 0.12 % solution 15 mL  15 mL Mouth/Throat Once Prescott Gum, Collier Salina, MD      . chlorhexidine (PERIDEX) 0.12 % solution           . dexmedetomidine (PRECEDEX) 400 MCG/100ML (4 mcg/mL) infusion  0.1-0.7 mcg/kg/hr Intravenous To OR Prescott Gum, Collier Salina, MD      . DOPamine (INTROPIN) 800 mg in dextrose 5 % 250 mL (3.2 mg/mL) infusion  0-10 mcg/kg/min Intravenous To OR Prescott Gum, Collier Salina, MD      . EPINEPHrine (ADRENALIN) 4 mg in dextrose 5 % 250 mL (0.016 mg/mL) infusion  0-10 mcg/min Intravenous To OR Ivin Poot, MD      .  hemostatic agents    PRN Prescott Gum, Collier Salina, MD   1 application at 04/20/50 0800  . heparin 30,000 units/NS 1000 mL solution for CELLSAVER   Other To OR Prescott Gum, Collier Salina, MD      . insulin regular (NOVOLIN R,HUMULIN R) 100 Units in sodium chloride 0.9 % 100 mL (1 Units/mL) infusion   Intravenous To OR Prescott Gum, Collier Salina, MD      . magnesium sulfate (IV Push/IM) injection 40 mEq  40 mEq Other To OR Prescott Gum, Collier Salina, MD      . metoprolol tartrate (LOPRESSOR) tablet 12.5 mg  12.5 mg Oral Once Prescott Gum, Collier Salina, MD      . milrinone (PRIMACOR) 20 MG/100 ML (0.2 mg/mL) infusion  0.3 mcg/kg/min Intravenous To OR Prescott Gum, Collier Salina, MD      . milrinone (PRIMACOR) 20 MG/100 ML (0.2 mg/mL) infusion  0.125 mcg/kg/min Intravenous To OR Prescott Gum, Collier Salina, MD      . nitroGLYCERIN 50 mg in dextrose 5 % 250 mL (0.2 mg/mL) infusion  2-200 mcg/min Intravenous To OR Prescott Gum, Collier Salina, MD      . phenylephrine (NEO-SYNEPHRINE) 20 mg in sodium chloride 0.9 % 250 mL (0.08 mg/mL) infusion  30-200 mcg/min Intravenous To OR Prescott Gum, Collier Salina, MD      . potassium chloride injection 80 mEq  80 mEq Other To OR Prescott Gum, Collier Salina, MD      . tranexamic acid (CYKLOKAPRON) 2,500 mg in sodium chloride 0.9 % 250 mL (10 mg/mL) infusion  1.5 mg/kg/hr Intravenous To OR Prescott Gum, Collier Salina, MD      . tranexamic acid (CYKLOKAPRON) bolus via infusion - over 30 minutes 1,390.5 mg  15 mg/kg Intravenous To OR Prescott Gum, Collier Salina, MD      . tranexamic acid (CYKLOKAPRON) pump prime solution 185 mg  2 mg/kg Intracatheter To OR Prescott Gum, Collier Salina, MD      . vancomycin (VANCOCIN) 1,500 mg in sodium chloride 0.9 % 250 mL IVPB  1,500 mg Intravenous To OR Ivin Poot, MD        Allergies  Allergen Reactions  . Fluorescein Hives and Itching  . Ivp Dye [Iodinated Diagnostic Agents] Hives, Itching and Other (See  Comments)    Face got splotchy.    . Typhoid Vaccines Swelling  . Yellow Dye Hives and Itching    Dye in eye test  . Codeine Other (See Comments)     Puts me to sleep    Review of Systems  No change from initial consult except cough and sinus congestion improved No fever Ambulating without much difficulty No ankle swelling  BP (!) 186/72   Pulse 63   Temp 97.8 F (36.6 C) (Oral)   Resp 18   SpO2 97%  Physical Exam        Exam    General- alert and comfortable    Neck- no JVD, no cervical adenopathy palpable, no carotid bruit   Lungs- breath sounds equal wiith scattered rhonchi   Cor- regular rate and rhythm, no murmur , gallop   Abdomen- soft, non-tender   Extremities - warm, non-tender, minimal edema   Neuro- oriented, appropriate, no focal weakness   Diagnostic Tests: Chest x-ray images personally reviewed showing COPD  Impression: Ischemic cardiomyopathy.  CABG recommended by his cardiologist.  Patient's pulmonary status and CVHF optimized now and patient presents for high risk CABG    Len Childs, MD [facility] [phone]

## 2018-03-16 NOTE — Anesthesia Procedure Notes (Signed)
Procedure Name: Intubation Date/Time: 03/16/2018 7:50 AM Performed by: Moshe Salisbury, CRNA Pre-anesthesia Checklist: Patient identified, Emergency Drugs available, Suction available and Patient being monitored Patient Re-evaluated:Patient Re-evaluated prior to induction Oxygen Delivery Method: Circle System Utilized Preoxygenation: Pre-oxygenation with 100% oxygen Induction Type: IV induction Ventilation: Mask ventilation without difficulty Laryngoscope Size: Mac and 4 Grade View: Grade II Tube type: Subglottic suction tube Tube size: 8.0 mm Number of attempts: 1 Airway Equipment and Method: Stylet Placement Confirmation: ETT inserted through vocal cords under direct vision,  positive ETCO2 and breath sounds checked- equal and bilateral Secured at: 23 cm Tube secured with: Tape Dental Injury: Teeth and Oropharynx as per pre-operative assessment

## 2018-03-16 NOTE — Anesthesia Procedure Notes (Signed)
Arterial Line Insertion Start/End9/06/2018 6:55 AM, 03/16/2018 7:05 AM Performed by: Moshe Salisbury, CRNA, CRNA  Patient location: Pre-op. Preanesthetic checklist: patient identified, IV checked, site marked, risks and benefits discussed, surgical consent, monitors and equipment checked, pre-op evaluation, timeout performed and anesthesia consent Lidocaine 1% used for infiltration and patient sedated Left, radial was placed Catheter size: 20 G Hand hygiene performed , maximum sterile barriers used  and Seldinger technique used Allen's test indicative of satisfactory collateral circulation Attempts: 1 Procedure performed without using ultrasound guided technique. Following insertion, dressing applied and Biopatch. Post procedure assessment: normal  Patient tolerated the procedure well with no immediate complications.

## 2018-03-16 NOTE — Brief Op Note (Signed)
      [address]       [place] [address]             [phone]     03/16/2018  8:04 AM  PATIENT:  Robert [name]  77 y.o. male  PRE-OPERATIVE DIAGNOSIS:  CAD  POST-OPERATIVE DIAGNOSIS:  CAD  PROCEDURE:  Procedure(s): CORONARY ARTERY BYPASS GRAFTING (CABG) x 4, LIMA-LAD, SVG-RAMUS, SVG-PD, SVG-DIAG, USING LEFT INTERNAL MAMMARY ARTERY AND RIGHT GREATER SAPHENOUS VEIN HARVESTED ENDOSCOPICALLY (N/A) TRANSESOPHAGEAL ECHOCARDIOGRAM (TEE) (N/A) LIMA-LAD SVG-RAMUS SVG-PD SVG-DIAG  SURGEON:  Surgeon(s) and Role:    Ivin Poot, MD - Primary  PHYSICIAN ASSISTANT: Braxen Dobek PA-C  ANESTHESIA:   general  EBL:  600 mL   BLOOD ADMINISTERED:1 FFP  DRAINS: MEDIASTINAL AND PLEURAL CHEST TUBES   LOCAL MEDICATIONS USED:  NONE  SPECIMEN:  No Specimen  DISPOSITION OF SPECIMEN:  N/A  COUNTS:  YES  TOURNIQUET:  * No tourniquets in log *  DICTATION: .Other Dictation: Dictation Number PENDING  PLAN OF CARE: Admit to inpatient   PATIENT DISPOSITION:  ICU - intubated and hemodynamically stable.   Delay start of Pharmacological VTE agent (>24hrs) due to surgical blood loss or risk of bleeding: yes

## 2018-03-16 NOTE — Progress Notes (Signed)
Inpatient Diabetes Program Recommendations  AACE/ADA: New Consensus Statement on Inpatient Glycemic Control (2015)  Target Ranges:  Prepandial:   less than 140 mg/dL      Peak postprandial:   less than 180 mg/dL (1-2 hours)      Critically ill patients:  140 - 180 mg/dL   Lab Results  Component Value Date   HGBA1C 7.1 (H) 03/10/2018    Review of Glycemic Control  Diabetes history: None  Inpatient Diabetes Program Recommendations:    A1c 7.1%, Per ADA guidelines meeting criteria for new diabetes diagnosis. Will need to follow patient and discuss when able post op.  Thanks,  Tama Headings RN, MSN, BC-ADM Inpatient Diabetes Coordinator Team Pager [phone] (8a-5p)

## 2018-03-16 NOTE — Transfer of Care (Signed)
Immediate Anesthesia Transfer of Care Note  Patient: Robert [name]  Procedure(s) Performed: CORONARY ARTERY BYPASS GRAFTING (CABG) x 4, LIMA-LAD, SVG-RAMUS, SVG-PD, SVG-DIAG, USING LEFT INTERNAL MAMMARY ARTERY AND RIGHT GREATER SAPHENOUS VEIN HARVESTED ENDOSCOPICALLY (N/A Chest) TRANSESOPHAGEAL ECHOCARDIOGRAM (TEE) (N/A )  Patient Location: ICU  Anesthesia Type:General  Level of Consciousness: unresponsive and Patient remains intubated per anesthesia plan  Airway & Oxygen Therapy: Patient remains intubated per anesthesia plan and Patient placed on Ventilator (see vital sign flow sheet for setting)  Post-op Assessment: Report given to RN and Post -op Vital signs reviewed and stable  Post vital signs: Reviewed and stable  Last Vitals:  Vitals Value Taken Time  BP 92/53 03/16/2018  1:32 PM  Temp    Pulse    Resp 17 03/16/2018  1:33 PM  SpO2    Vitals shown include unvalidated device data.  Last Pain:  Vitals:   03/16/18 0623  TempSrc:   PainSc: 0-No pain         Complications: No apparent anesthesia complications

## 2018-03-16 NOTE — Anesthesia Postprocedure Evaluation (Signed)
Anesthesia Post Note  Patient: Robert [name]  Procedure(s) Performed: CORONARY ARTERY BYPASS GRAFTING (CABG) x 4, LIMA-LAD, SVG-RAMUS, SVG-PD, SVG-DIAG, USING LEFT INTERNAL MAMMARY ARTERY AND RIGHT GREATER SAPHENOUS VEIN HARVESTED ENDOSCOPICALLY (N/A Chest) TRANSESOPHAGEAL ECHOCARDIOGRAM (TEE) (N/A )     Patient location during evaluation: SICU Anesthesia Type: General Level of consciousness: awake and alert, oriented and patient cooperative Pain management: pain level controlled Vital Signs Assessment: post-procedure vital signs reviewed and stable Respiratory status: spontaneous breathing, nonlabored ventilation, respiratory function stable and patient connected to nasal cannula oxygen (pt recently extubated) Cardiovascular status: remains on inotropic and pressor support: milrinone and epi, levophed infusions. Postop Assessment: no apparent nausea or vomiting Anesthetic complications: no    Last Vitals:  Vitals:   03/16/18 1845 03/16/18 1900  BP:    Pulse: 64 65  Resp: (!) 21 (!) 23  Temp: 37.5 C 37.4 C  SpO2: 98% 99%    Last Pain:  Vitals:   03/16/18 0623  TempSrc:   PainSc: 0-No pain                 Nerissa Constantin,E. Jakiyah Stepney

## 2018-03-16 NOTE — Progress Notes (Signed)
      [address]       [place] [address]             [phone]      S/p CABG   Extubated, comfortable  BP 133/66   Pulse 75   Temp 98.8 F (37.1 C)   Resp 20   Ht 5\' 9"  (1.753 m)   SpO2 96%   BMI 30.17 kg/m   Intake/Output Summary (Last 24 hours) at 03/16/2018 1805 Last data filed at 03/16/2018 1700 Gross per 24 hour  Intake 5301.83 ml  Output 2015 ml  Net 3286.83 ml   Minimal CT output  Doing well early postop  Remo Lipps C. Roxan Hockey, MD [facility] [phone]

## 2018-03-16 NOTE — Anesthesia Procedure Notes (Signed)
Central Venous Catheter Insertion Performed by: Annye Asa, MD, anesthesiologist Start/End9/06/2018 6:51 AM, 03/16/2018 7:05 AM Patient location: Pre-op. Preanesthetic checklist: patient identified, IV checked, risks and benefits discussed, surgical consent, monitors and equipment checked, pre-op evaluation, timeout performed and anesthesia consent Position: supine Lidocaine 1% used for infiltration and patient sedated Hand hygiene performed , maximum sterile barriers used  and Seldinger technique used Catheter size: 8.5 Fr PA cath was placed.Sheath introducer Swan type:thermodilution Procedure performed using ultrasound guided technique. Ultrasound Notes:anatomy identified, needle tip was noted to be adjacent to the nerve/plexus identified, no ultrasound evidence of intravascular and/or intraneural injection and image(s) printed for medical record Attempts: 1 Following insertion, line sutured, dressing applied and Biopatch. Post procedure assessment: blood return through all ports, free fluid flow and no air  Patient tolerated the procedure well with no immediate complications. Additional procedure comments: PA catheter:  Routine monitors. Timeout, sterile prep, drape, FBP R neck.  Supine position.  1% Lido local, finder and trocar RIJ 1st pass with US guidance.  Cordis placed over J wire. PA catheter in easily.  Sterile dressing applied.  Patient tolerated well, VSS.  Jenita Seashore, MD.

## 2018-03-16 NOTE — Progress Notes (Signed)
Pre Procedure note for inpatients:   Robert [name] has been scheduled for Procedure(s): CORONARY ARTERY BYPASS GRAFTING (CABG) (N/A) TRANSESOPHAGEAL ECHOCARDIOGRAM (TEE) (N/A) today. The various methods of treatment have been discussed with the patient. After consideration of the risks, benefits and treatment options the patient has consented to the planned procedure.   The patient has been seen and labs reviewed. There are no changes in the patient's condition to prevent proceeding with the planned procedure today.  Recent labs:  Lab Results  Component Value Date   WBC 17.4 (H) 03/10/2018   HGB 14.4 03/10/2018   HCT 42.6 03/10/2018   PLT 176 03/10/2018   GLUCOSE 257 (H) 03/10/2018   CHOL 98 01/27/2018   TRIG 204 (H) 01/27/2018   HDL 29 (L) 01/27/2018   LDLCALC 28 01/27/2018   ALT 26 03/10/2018   AST 20 03/10/2018   NA 138 03/10/2018   K 3.7 03/10/2018   CL 105 03/10/2018   CREATININE 1.22 03/10/2018   BUN 16 03/10/2018   CO2 24 03/10/2018   INR 1.07 03/10/2018   HGBA1C 7.1 (H) 03/10/2018    Len Childs, MD 03/16/2018 7:17 AM

## 2018-03-17 ENCOUNTER — Encounter (HOSPITAL_COMMUNITY): Payer: Self-pay | Admitting: Cardiothoracic Surgery

## 2018-03-17 ENCOUNTER — Inpatient Hospital Stay (HOSPITAL_COMMUNITY): Payer: Medicare Other

## 2018-03-17 LAB — GLUCOSE, CAPILLARY
Glucose-Capillary: 104 mg/dL — ABNORMAL HIGH (ref 70–99)
Glucose-Capillary: 118 mg/dL — ABNORMAL HIGH (ref 70–99)
Glucose-Capillary: 124 mg/dL — ABNORMAL HIGH (ref 70–99)
Glucose-Capillary: 126 mg/dL — ABNORMAL HIGH (ref 70–99)
Glucose-Capillary: 132 mg/dL — ABNORMAL HIGH (ref 70–99)
Glucose-Capillary: 133 mg/dL — ABNORMAL HIGH (ref 70–99)
Glucose-Capillary: 134 mg/dL — ABNORMAL HIGH (ref 70–99)
Glucose-Capillary: 139 mg/dL — ABNORMAL HIGH (ref 70–99)
Glucose-Capillary: 143 mg/dL — ABNORMAL HIGH (ref 70–99)
Glucose-Capillary: 143 mg/dL — ABNORMAL HIGH (ref 70–99)
Glucose-Capillary: 147 mg/dL — ABNORMAL HIGH (ref 70–99)
Glucose-Capillary: 147 mg/dL — ABNORMAL HIGH (ref 70–99)
Glucose-Capillary: 148 mg/dL — ABNORMAL HIGH (ref 70–99)
Glucose-Capillary: 156 mg/dL — ABNORMAL HIGH (ref 70–99)
Glucose-Capillary: 176 mg/dL — ABNORMAL HIGH (ref 70–99)
Glucose-Capillary: 204 mg/dL — ABNORMAL HIGH (ref 70–99)

## 2018-03-17 LAB — POCT I-STAT, CHEM 8
BUN: 13 mg/dL (ref 8–23)
Calcium, Ion: 0.94 mmol/L — ABNORMAL LOW (ref 1.15–1.40)
Chloride: 102 mmol/L (ref 98–111)
Creatinine, Ser: 1.2 mg/dL (ref 0.61–1.24)
Glucose, Bld: 174 mg/dL — ABNORMAL HIGH (ref 70–99)
HCT: 32 % — ABNORMAL LOW (ref 39.0–52.0)
Hemoglobin: 10.9 g/dL — ABNORMAL LOW (ref 13.0–17.0)
Potassium: 5 mmol/L (ref 3.5–5.1)
Sodium: 137 mmol/L (ref 135–145)
TCO2: 22 mmol/L (ref 22–32)

## 2018-03-17 LAB — BASIC METABOLIC PANEL
Anion gap: 8 (ref 5–15)
BUN: 11 mg/dL (ref 8–23)
CO2: 22 mmol/L (ref 22–32)
Calcium: 7.7 mg/dL — ABNORMAL LOW (ref 8.9–10.3)
Chloride: 108 mmol/L (ref 98–111)
Creatinine, Ser: 1.15 mg/dL (ref 0.61–1.24)
GFR calc Af Amer: >60 mL/min (ref >60)
GFR calc non Af Amer: >60 mL/min (ref >60)
Glucose, Bld: 145 mg/dL — ABNORMAL HIGH (ref 70–99)
Potassium: 4.1 mmol/L (ref 3.5–5.1)
Sodium: 138 mmol/L (ref 135–145)

## 2018-03-17 LAB — CBC
HCT: 31 % — ABNORMAL LOW (ref 39.0–52.0)
HCT: 33.4 % — ABNORMAL LOW (ref 39.0–52.0)
Hemoglobin: 10.7 g/dL — ABNORMAL LOW (ref 13.0–17.0)
Hemoglobin: 11.5 g/dL — ABNORMAL LOW (ref 13.0–17.0)
MCH: 32.4 pg (ref 26.0–34.0)
MCH: 32.7 pg (ref 26.0–34.0)
MCHC: 34.5 g/dL (ref 30.0–36.0)
MCHC: 34.4 g/dL (ref 30.0–36.0)
MCV: 93.9 fL (ref 78.0–100.0)
MCV: 94.9 fL (ref 78.0–100.0)
Platelets: 150 10*3/uL (ref 150–400)
Platelets: 142 10*3/uL — ABNORMAL LOW (ref 150–400)
RBC: 3.3 MIL/uL — ABNORMAL LOW (ref 4.22–5.81)
RBC: 3.52 MIL/uL — ABNORMAL LOW (ref 4.22–5.81)
RDW: 13.3 % (ref 11.5–15.5)
RDW: 14.1 % (ref 11.5–15.5)
WBC: 23.3 10*3/uL — ABNORMAL HIGH (ref 4.0–10.5)
WBC: 27 10*3/uL — ABNORMAL HIGH (ref 4.0–10.5)

## 2018-03-17 LAB — PREPARE FRESH FROZEN PLASMA

## 2018-03-17 LAB — CREATININE, SERUM
Creatinine, Ser: 1.27 mg/dL — ABNORMAL HIGH (ref 0.61–1.24)
GFR calc Af Amer: >60 mL/min (ref >60)
GFR calc non Af Amer: 54 mL/min — ABNORMAL LOW (ref >60)

## 2018-03-17 LAB — BPAM FFP
Blood Product Expiration Date: 201909172359
ISSUE DATE / TIME: 201909121201
Unit Type and Rh: 7300

## 2018-03-17 LAB — MAGNESIUM
Magnesium: 2.8 mg/dL — ABNORMAL HIGH (ref 1.7–2.4)
Magnesium: 2.4 mg/dL (ref 1.7–2.4)

## 2018-03-17 MED ORDER — ORAL CARE MOUTH RINSE
15.0000 mL | Freq: Two times a day (BID) | OROMUCOSAL | Status: DC
  Administered 2018-03-17 – 2018-03-19 (×3): 15 mL via OROMUCOSAL

## 2018-03-17 MED ORDER — AMIODARONE HCL 200 MG PO TABS
200.0000 mg | ORAL_TABLET | Freq: Two times a day (BID) | ORAL | Status: DC
  Administered 2018-03-17 – 2018-03-25 (×17): 200 mg via ORAL
  Filled 2018-03-17 (×17): qty 1

## 2018-03-17 MED ORDER — INSULIN DETEMIR 100 UNIT/ML ~~LOC~~ SOLN
18.0000 [IU] | Freq: Two times a day (BID) | SUBCUTANEOUS | Status: DC
  Administered 2018-03-17 – 2018-03-23 (×13): 18 [IU] via SUBCUTANEOUS
  Filled 2018-03-17 (×16): qty 0.18

## 2018-03-17 MED ORDER — DEXMEDETOMIDINE HCL IN NACL 400 MCG/100ML IV SOLN
0.0000 ug/kg/h | INTRAVENOUS | Status: DC
  Administered 2018-03-17: 0.2 ug/kg/h via INTRAVENOUS
  Filled 2018-03-17: qty 100

## 2018-03-17 MED ORDER — FUROSEMIDE 10 MG/ML IJ SOLN
40.0000 mg | Freq: Two times a day (BID) | INTRAMUSCULAR | Status: DC
  Administered 2018-03-17 – 2018-03-18 (×3): 40 mg via INTRAVENOUS
  Filled 2018-03-17 (×3): qty 4

## 2018-03-17 MED ORDER — INSULIN ASPART 100 UNIT/ML ~~LOC~~ SOLN
3.0000 [IU] | Freq: Three times a day (TID) | SUBCUTANEOUS | Status: DC
  Administered 2018-03-17 – 2018-03-21 (×11): 3 [IU] via SUBCUTANEOUS

## 2018-03-17 MED ORDER — INSULIN ASPART 100 UNIT/ML ~~LOC~~ SOLN
0.0000 [IU] | SUBCUTANEOUS | Status: DC
  Administered 2018-03-17: 8 [IU] via SUBCUTANEOUS
  Administered 2018-03-17 – 2018-03-19 (×10): 2 [IU] via SUBCUTANEOUS

## 2018-03-17 MED FILL — Heparin Sodium (Porcine) Inj 1000 Unit/ML: INTRAMUSCULAR | Qty: 30 | Status: AC

## 2018-03-17 MED FILL — Potassium Chloride Inj 2 mEq/ML: INTRAVENOUS | Qty: 40 | Status: AC

## 2018-03-17 MED FILL — Magnesium Sulfate Inj 50%: INTRAMUSCULAR | Qty: 10 | Status: AC

## 2018-03-17 NOTE — Progress Notes (Signed)
Pt states he "went to the bathroom". Upon assessment, both bedpads soaked with urine. 23mL uine in foley bag. Will bladder scan pt.

## 2018-03-17 NOTE — Progress Notes (Signed)
Noted that HgbA1C is 7.1%. Noted that patient had been on steroids prior to admission.  This may be the reason why A1C is elevated.  Patient will need to follow up with PCP after discharge about A1C.  Harvel Ricks RN BSN CDE Diabetes Coordinator Pager: [phone]  8am-5pm

## 2018-03-17 NOTE — Progress Notes (Signed)
[facility] DAILY ICU PROGRESS NOTE                   [address]            [place] [address]          [phone]   1 Day Post-Op Procedure(s) (LRB): CORONARY ARTERY BYPASS GRAFTING (CABG) x 4, LIMA-LAD, SVG-RAMUS, SVG-PD, SVG-DIAG, USING LEFT INTERNAL MAMMARY ARTERY AND RIGHT GREATER SAPHENOUS VEIN HARVESTED ENDOSCOPICALLY (N/A) TRANSESOPHAGEAL ECHOCARDIOGRAM (TEE) (N/A)  Total Length of Stay:  LOS: 1 day   Subjective: Feels "rough", c/o being thirsty  Objective: Vital signs in last 24 hours: Temp:  [97.3 F (36.3 C)-99.5 F (37.5 C)] 98.8 F (37.1 C) (09/13 0715) Pulse Rate:  [59-80] 66 (09/13 0715) Cardiac Rhythm: Normal sinus rhythm (09/13 0400) Resp:  [0-29] 22 (09/13 0715) BP: (92-154)/(49-92) 132/60 (09/13 0700) SpO2:  [93 %-100 %] 97 % (09/13 0715) Arterial Line BP: (93-186)/(37-87) 149/53 (09/13 0715) FiO2 (%):  [40 %-50 %] 40 % (09/12 1630) Weight:  [98.5 kg] 98.5 kg (09/13 0500)  Filed Weights   03/17/18 0500  Weight: 98.5 kg    Weight change:    Hemodynamic parameters for last 24 hours: PAP: (15-45)/(6-24) 35/24 CO:  [4 L/min-5.2 L/min] 4.3 L/min CI:  [1.9 L/min/m2-2.5 L/min/m2] 2.1 L/min/m2  Intake/Output from previous day: 09/12 0701 - 09/13 0700 In: 8090.7 [I.V.:4792.6; Blood:495; IV Piggyback:2803.1] Out: 9030 [Urine:1995; Blood:600; Chest Tube:900]  Intake/Output this shift: Total I/O In: 89.7 [I.V.:89.7] Out: 70 [Urine:20; Chest Tube:50]  Current Meds: Scheduled Meds: . acetaminophen  1,000 mg Oral Q6H   Or  . acetaminophen (TYLENOL) oral liquid 160 mg/5 mL  1,000 mg Per Tube Q6H  . aspirin EC  325 mg Oral Daily   Or  . aspirin  324 mg Per Tube Daily  . bisacodyl  10 mg Oral Daily   Or  . bisacodyl  10 mg Rectal Daily  . chlorhexidine gluconate (MEDLINE KIT)  15 mL Mouth Rinse BID  . docusate sodium  200 mg Oral Daily  . guaiFENesin  600 mg Oral BID  . insulin regular  0-10 Units Intravenous TID WC  . levalbuterol   1.25 mg Nebulization Q6H  . mouth rinse  15 mL Mouth Rinse 10 times per day  . metoprolol tartrate  12.5 mg Oral BID   Or  . metoprolol tartrate  12.5 mg Per Tube BID  . mometasone-formoterol  2 puff Inhalation BID  . [START ON 03/18/2018] pantoprazole  40 mg Oral Daily  . sodium chloride flush  3 mL Intravenous Q12H   Continuous Infusions: . sodium chloride Stopped (03/16/18 2132)  . sodium chloride    . sodium chloride 10 mL/hr at 03/16/18 1401  . amiodarone 30 mg/hr (03/17/18 0700)  . cefUROXime (ZINACEF)  IV Stopped (03/17/18 0402)  . dexmedetomidine (PRECEDEX) IV infusion Stopped (03/17/18 0439)  . EPINEPHrine 4 mg in dextrose 5% 250 mL infusion (16 mcg/mL) 1 mcg/min (03/17/18 0800)  . famotidine (PEPCID) IV Stopped (03/16/18 1408)  . insulin (NOVOLIN-R) infusion 2.1 mL/hr at 03/17/18 0800  . lactated ringers    . lactated ringers 10 mL/hr at 03/17/18 0800  . milrinone 0.3 mcg/kg/min (03/17/18 0800)  . nitroGLYCERIN    . norepinephrine (LEVOPHED) Adult infusion 3 mcg/min (03/17/18 0800)  . phenylephrine (NEO-SYNEPHRINE) Adult infusion 20 mcg/min (03/17/18 0800)   PRN Meds:.sodium chloride, metoprolol tartrate, midazolam, morphine injection, ondansetron (ZOFRAN) IV, oxyCODONE, sodium chloride flush, traMADol  General appearance: alert, cooperative  and no distress Heart: slightly irregular Lungs: dim mildly in bases Abdomen: soft, mild distension Extremities: minor edema Wound: dressings CDI Neuro- grossly intact  Lab Results: CBC: Recent Labs    03/16/18 1945 03/17/18 0345  WBC 25.7* 23.3*  HGB 10.7* 10.7*  HCT 31.4* 31.0*  PLT 154 150   BMET:  Recent Labs    03/16/18 1215 03/16/18 1341 03/16/18 1945 03/17/18 0345  NA 138 142  --  138  K 4.4 4.2  --  4.1  CL 101  --   --  108  CO2  --   --   --  22  GLUCOSE 195* 161*  --  145*  BUN 13  --   --  11  CREATININE 1.00  --  1.15 1.15  CALCIUM  --   --   --  7.7*    CMET: Lab Results  Component Value Date    WBC 23.3 (H) 03/17/2018   HGB 10.7 (L) 03/17/2018   HCT 31.0 (L) 03/17/2018   PLT 150 03/17/2018   GLUCOSE 145 (H) 03/17/2018   CHOL 98 01/27/2018   TRIG 204 (H) 01/27/2018   HDL 29 (L) 01/27/2018   LDLCALC 28 01/27/2018   ALT 26 03/10/2018   AST 20 03/10/2018   NA 138 03/17/2018   K 4.1 03/17/2018   CL 108 03/17/2018   CREATININE 1.15 03/17/2018   BUN 11 03/17/2018   CO2 22 03/17/2018   INR 1.39 03/16/2018   HGBA1C 7.1 (H) 03/10/2018      PT/INR:  Recent Labs    03/16/18 1346  LABPROT 17.0*  INR 1.39   Radiology: Dg Chest Port 1 View  Result Date: 03/16/2018 CLINICAL DATA:  Status post coronary bypass graft. EXAM: PORTABLE CHEST 1 VIEW COMPARISON:  Radiographs of March 10, 2018. FINDINGS: Endotracheal and nasogastric tubes are in grossly good position. Left-sided chest tube is noted without pneumothorax. Right internal jugular Swan-Ganz catheter is noted with tip in expected position of main pulmonary artery. Right lung is clear. Mild left basilar subsegmental atelectasis is noted. Bony thorax is unremarkable. IMPRESSION: Endotracheal and nasogastric tubes are in grossly good position. Left-sided chest tube is noted without pneumothorax. Mild left basilar subsegmental atelectasis. Electronically Signed   By: Marijo Conception, M.D.   On: 03/16/2018 14:42     Assessment/Plan: S/P Procedure(s) (LRB): CORONARY ARTERY BYPASS GRAFTING (CABG) x 4, LIMA-LAD, SVG-RAMUS, SVG-PD, SVG-DIAG, USING LEFT INTERNAL MAMMARY ARTERY AND RIGHT GREATER SAPHENOUS VEIN HARVESTED ENDOSCOPICALLY (N/A) TRANSESOPHAGEAL ECHOCARDIOGRAM (TEE) (N/A)  1 doing well, hemodyn stable , Vpaced with some PVC's 2 wean epi off this am 3 Begin gentle diuresis for volume overload, BUN/Creat in normal range 4 transition off glucommander to Levimir 5 transition IV amio to po  6 ABL anemia- stable 7 Leukocytosis- monitor- prob SIRS, tmax 99.5 8 Keep CT's for moderate drainage    Robert [name] Robert [name] 03/17/2018  8:07 AM   Pager [phone]

## 2018-03-17 NOTE — Progress Notes (Signed)
Dr. Prescott Gum at bedside assessing pt. Bladder scan performed: 38mL urine in bladder. Per Dr. Prescott Gum, order to remove current foley & replace to monitor for strict UOP.

## 2018-03-18 ENCOUNTER — Inpatient Hospital Stay (HOSPITAL_COMMUNITY): Payer: Medicare Other

## 2018-03-18 LAB — CBC
HCT: 33.1 % — ABNORMAL LOW (ref 39.0–52.0)
Hemoglobin: 11.1 g/dL — ABNORMAL LOW (ref 13.0–17.0)
MCH: 32.3 pg (ref 26.0–34.0)
MCHC: 33.5 g/dL (ref 30.0–36.0)
MCV: 96.2 fL (ref 78.0–100.0)
Platelets: 138 10*3/uL — ABNORMAL LOW (ref 150–400)
RBC: 3.44 MIL/uL — ABNORMAL LOW (ref 4.22–5.81)
RDW: 14 % (ref 11.5–15.5)
WBC: 26 10*3/uL — ABNORMAL HIGH (ref 4.0–10.5)

## 2018-03-18 LAB — GLUCOSE, CAPILLARY
Glucose-Capillary: 135 mg/dL — ABNORMAL HIGH (ref 70–99)
Glucose-Capillary: 141 mg/dL — ABNORMAL HIGH (ref 70–99)
Glucose-Capillary: 145 mg/dL — ABNORMAL HIGH (ref 70–99)
Glucose-Capillary: 148 mg/dL — ABNORMAL HIGH (ref 70–99)
Glucose-Capillary: 149 mg/dL — ABNORMAL HIGH (ref 70–99)
Glucose-Capillary: 149 mg/dL — ABNORMAL HIGH (ref 70–99)
Glucose-Capillary: 153 mg/dL — ABNORMAL HIGH (ref 70–99)

## 2018-03-18 LAB — BASIC METABOLIC PANEL
Anion gap: 10 (ref 5–15)
BUN: 14 mg/dL (ref 8–23)
CO2: 23 mmol/L (ref 22–32)
Calcium: 8.2 mg/dL — ABNORMAL LOW (ref 8.9–10.3)
Chloride: 100 mmol/L (ref 98–111)
Creatinine, Ser: 1.36 mg/dL — ABNORMAL HIGH (ref 0.61–1.24)
GFR calc Af Amer: 58 mL/min — ABNORMAL LOW (ref >60)
GFR calc non Af Amer: 50 mL/min — ABNORMAL LOW (ref >60)
Glucose, Bld: 153 mg/dL — ABNORMAL HIGH (ref 70–99)
Potassium: 4.3 mmol/L (ref 3.5–5.1)
Sodium: 133 mmol/L — ABNORMAL LOW (ref 135–145)

## 2018-03-18 LAB — POCT I-STAT, CHEM 8
BUN: 25 mg/dL — ABNORMAL HIGH (ref 8–23)
Calcium, Ion: 1.19 mmol/L (ref 1.15–1.40)
Chloride: 100 mmol/L (ref 98–111)
Creatinine, Ser: 1.5 mg/dL — ABNORMAL HIGH (ref 0.61–1.24)
Glucose, Bld: 142 mg/dL — ABNORMAL HIGH (ref 70–99)
HCT: 31 % — ABNORMAL LOW (ref 39.0–52.0)
Hemoglobin: 10.5 g/dL — ABNORMAL LOW (ref 13.0–17.0)
Potassium: 4.3 mmol/L (ref 3.5–5.1)
Sodium: 137 mmol/L (ref 135–145)
TCO2: 26 mmol/L (ref 22–32)

## 2018-03-18 LAB — COOXEMETRY PANEL
Carboxyhemoglobin: 1.6 % — ABNORMAL HIGH (ref 0.5–1.5)
Methemoglobin: 1.8 % — ABNORMAL HIGH (ref 0.0–1.5)
O2 Saturation: 67.8 %
Total hemoglobin: 10.7 g/dL — ABNORMAL LOW (ref 12.0–16.0)

## 2018-03-18 MED ORDER — FUROSEMIDE 10 MG/ML IJ SOLN
80.0000 mg | Freq: Two times a day (BID) | INTRAMUSCULAR | Status: DC
  Administered 2018-03-18 – 2018-03-19 (×2): 80 mg via INTRAVENOUS
  Filled 2018-03-18 (×2): qty 8

## 2018-03-18 MED ORDER — FUROSEMIDE 10 MG/ML IJ SOLN
40.0000 mg | Freq: Once | INTRAMUSCULAR | Status: AC
  Administered 2018-03-18: 40 mg via INTRAVENOUS
  Filled 2018-03-18: qty 4

## 2018-03-18 MED ORDER — METOCLOPRAMIDE HCL 5 MG/ML IJ SOLN
10.0000 mg | Freq: Four times a day (QID) | INTRAMUSCULAR | Status: DC
  Administered 2018-03-18 – 2018-03-21 (×9): 10 mg via INTRAVENOUS
  Filled 2018-03-18 (×9): qty 2

## 2018-03-18 MED ORDER — METOLAZONE 5 MG PO TABS
5.0000 mg | ORAL_TABLET | Freq: Every day | ORAL | Status: AC
  Administered 2018-03-18 – 2018-03-19 (×2): 5 mg via ORAL
  Filled 2018-03-18 (×2): qty 1

## 2018-03-18 MED ORDER — LEVALBUTEROL HCL 1.25 MG/0.5ML IN NEBU
1.2500 mg | INHALATION_SOLUTION | Freq: Three times a day (TID) | RESPIRATORY_TRACT | Status: DC
  Administered 2018-03-19: 1.25 mg via RESPIRATORY_TRACT
  Filled 2018-03-18: qty 0.5

## 2018-03-18 MED ORDER — CHLORHEXIDINE GLUCONATE CLOTH 2 % EX PADS
6.0000 | MEDICATED_PAD | Freq: Every day | CUTANEOUS | Status: DC
  Administered 2018-03-18 – 2018-03-23 (×6): 6 via TOPICAL

## 2018-03-18 MED ORDER — PNEUMOCOCCAL VAC POLYVALENT 25 MCG/0.5ML IJ INJ
0.5000 mL | INJECTION | INTRAMUSCULAR | Status: DC
  Filled 2018-03-18: qty 0.5

## 2018-03-18 NOTE — Progress Notes (Signed)
Two RNs at bedside to remove previous foley & insert new foley. Explained procedure to pt; pt consents. Pt reports feeling the urge to urinate; urine flowing around foley and onto bedpad. Upon removal of initial foley catheter, large blood clot at tip of catheter seen. New foley inserted without any complications; pt tolerated well. Assessment ongoing.

## 2018-03-18 NOTE — Plan of Care (Signed)
  Problem: Education: Goal: Knowledge of General Education information will improve Description Including pain rating scale, medication(s)/side effects and non-pharmacologic comfort measures Outcome: Progressing   Problem: Health Behavior/Discharge Planning: Goal: Ability to manage health-related needs will improve Outcome: Progressing   Problem: Clinical Measurements: Goal: Ability to maintain clinical measurements within normal limits will improve Outcome: Progressing Goal: Will remain free from infection Outcome: Progressing Goal: Respiratory complications will improve Outcome: Progressing Goal: Cardiovascular complication will be avoided Outcome: Progressing   Problem: Nutrition: Goal: Adequate nutrition will be maintained Outcome: Progressing   Problem: Coping: Goal: Level of anxiety will decrease Outcome: Progressing   Problem: Education: Goal: Knowledge of disease or condition will improve Outcome: Progressing Goal: Knowledge of the prescribed therapeutic regimen will improve Outcome: Progressing   Problem: Activity: Goal: Risk for activity intolerance will decrease Outcome: Progressing   Problem: Cardiac: Goal: Will achieve and/or maintain hemodynamic stability Outcome: Progressing   Problem: Respiratory: Goal: Respiratory status will improve Outcome: Progressing   Problem: Skin Integrity: Goal: Risk for impaired skin integrity will decrease Outcome: Progressing

## 2018-03-18 NOTE — Progress Notes (Signed)
I received a [place] to visit the patient and share Advance Directive information. The patient's wife was present and I provided the AD document and described what it covered. The patient decided to read over the AD and complete at a later time. I left the AD with them and shared that the Chaplain is available for further assistance as needed or requested.    03/18/18 1400  Clinical Encounter Type  Visited With Patient and family together  Visit Type Follow-up  Referral From Nurse  Consult/Referral To Chaplain  Spiritual Encounters  Spiritual Needs Literature;Other (Comment)  Stress Factors  Patient Stress Factors Exhausted  Family Stress Factors None identified    Chaplain Dr Redgie Grayer

## 2018-03-18 NOTE — Progress Notes (Signed)
CT surgery p.m. Rounds  Patient progressing slowly due to obesity and ischemic cardiomyopathy preop Maintaining sinus rhythm Abdomen slightly distended with mild ileus on IV Reglan, some nausea today.  Passing some flatus.  No tenderness to exam Starting to diurese better.

## 2018-03-18 NOTE — Progress Notes (Signed)
2 Days Post-Op Procedure(s) (LRB): CORONARY ARTERY BYPASS GRAFTING (CABG) x 4, LIMA-LAD, SVG-RAMUS, SVG-PD, SVG-DIAG, USING LEFT INTERNAL MAMMARY ARTERY AND RIGHT GREATER SAPHENOUS VEIN HARVESTED ENDOSCOPICALLY (N/A) TRANSESOPHAGEAL ECHOCARDIOGRAM (TEE) (N/A) Subjective: slow progress after CABG - ischemic CM, DM  Objective: Vital signs in last 24 hours: Temp:  [97.9 F (36.6 C)-98.8 F (37.1 C)] 98.8 F (37.1 C) (09/14 0720) Pulse Rate:  [44-92] 71 (09/14 0800) Cardiac Rhythm: Normal sinus rhythm (09/14 0800) Resp:  [12-30] 12 (09/14 0800) BP: (96-167)/(45-137) 117/59 (09/14 0800) SpO2:  [84 %-97 %] 96 % (09/14 0843) Arterial Line BP: (106-180)/(35-68) 114/45 (09/14 0800) Weight:  [98.6 kg] 98.6 kg (09/14 0500)  Hemodynamic parameters for last 24 hours:   nsr Intake/Output from previous day: 09/13 0701 - 09/14 0700 In: 1383.3 [P.O.:240; I.V.:943.1; IV Piggyback:200.2] Out: 1293 [Urine:853; Chest Tube:440] Intake/Output this shift: Total I/O In: 48.3 [P.O.:30; I.V.:18.3] Out: 45 [Urine:45]       Exam    General- alert and comfortable    Neck- no JVD, no cervical adenopathy palpable, no carotid bruit   Lungs- clear without rales, wheezes   Cor- regular rate and rhythm, no murmur , gallop   Abdomen- soft, non-tender   Extremities - warm, non-tender, minimal edema   Neuro- oriented, appropriate, no focal weakness   Lab Results: Recent Labs    03/17/18 1631 03/17/18 1634 03/18/18 0442  WBC 27.0*  --  26.0*  HGB 11.5* 10.9* 11.1*  HCT 33.4* 32.0* 33.1*  PLT 142*  --  138*   BMET:  Recent Labs    03/17/18 0345  03/17/18 1634 03/18/18 0442  NA 138  --  137 133*  K 4.1  --  5.0 4.3  CL 108  --  102 100  CO2 22  --   --  23  GLUCOSE 145*  --  174* 153*  BUN 11  --  13 14  CREATININE 1.15   < > 1.20 1.36*  CALCIUM 7.7*  --   --  8.2*   < > = values in this interval not displayed.    PT/INR:  Recent Labs    03/16/18 1346  LABPROT 17.0*  INR 1.39    ABG    Component Value Date/Time   PHART 7.298 (L) 03/16/2018 1848   HCO3 22.2 03/16/2018 1848   TCO2 22 03/17/2018 1634   ACIDBASEDEF 4.0 (H) 03/16/2018 1848   O2SAT 67.8 03/18/2018 0540   CBG (last 3)  Recent Labs    03/18/18 0102 03/18/18 0449 03/18/18 0723  GLUCAP 149* 148* 153*    Assessment/Plan: S/P Procedure(s) (LRB): CORONARY ARTERY BYPASS GRAFTING (CABG) x 4, LIMA-LAD, SVG-RAMUS, SVG-PD, SVG-DIAG, USING LEFT INTERNAL MAMMARY ARTERY AND RIGHT GREATER SAPHENOUS VEIN HARVESTED ENDOSCOPICALLY (N/A) TRANSESOPHAGEAL ECHOCARDIOGRAM (TEE) (N/A) Mobilize Diuresis Diabetes control d/c tubes/lines po amiodarone   LOS: 2 days    Tharon Aquas Trigt III 03/18/2018

## 2018-03-19 ENCOUNTER — Inpatient Hospital Stay (HOSPITAL_COMMUNITY): Payer: Medicare Other

## 2018-03-19 LAB — CULTURE, RESPIRATORY W GRAM STAIN
Culture: NORMAL
Special Requests: NORMAL

## 2018-03-19 LAB — GLUCOSE, CAPILLARY
Glucose-Capillary: 119 mg/dL — ABNORMAL HIGH (ref 70–99)
Glucose-Capillary: 122 mg/dL — ABNORMAL HIGH (ref 70–99)
Glucose-Capillary: 132 mg/dL — ABNORMAL HIGH (ref 70–99)
Glucose-Capillary: 132 mg/dL — ABNORMAL HIGH (ref 70–99)
Glucose-Capillary: 117 mg/dL — ABNORMAL HIGH (ref 70–99)

## 2018-03-19 LAB — BASIC METABOLIC PANEL
Anion gap: 11 (ref 5–15)
BUN: 28 mg/dL — ABNORMAL HIGH (ref 8–23)
CO2: 25 mmol/L (ref 22–32)
Calcium: 8 mg/dL — ABNORMAL LOW (ref 8.9–10.3)
Chloride: 101 mmol/L (ref 98–111)
Creatinine, Ser: 1.8 mg/dL — ABNORMAL HIGH (ref 0.61–1.24)
GFR calc Af Amer: 41 mL/min — ABNORMAL LOW (ref >60)
GFR calc non Af Amer: 35 mL/min — ABNORMAL LOW (ref >60)
Glucose, Bld: 122 mg/dL — ABNORMAL HIGH (ref 70–99)
Potassium: 3.6 mmol/L (ref 3.5–5.1)
Sodium: 137 mmol/L (ref 135–145)

## 2018-03-19 LAB — POCT I-STAT, CHEM 8
BUN: 35 mg/dL — ABNORMAL HIGH (ref 8–23)
Calcium, Ion: 1.15 mmol/L (ref 1.15–1.40)
Chloride: 101 mmol/L (ref 98–111)
Creatinine, Ser: 1.9 mg/dL — ABNORMAL HIGH (ref 0.61–1.24)
Glucose, Bld: 139 mg/dL — ABNORMAL HIGH (ref 70–99)
HCT: 28 % — ABNORMAL LOW (ref 39.0–52.0)
Hemoglobin: 9.5 g/dL — ABNORMAL LOW (ref 13.0–17.0)
Potassium: 3.5 mmol/L (ref 3.5–5.1)
Sodium: 135 mmol/L (ref 135–145)
TCO2: 27 mmol/L (ref 22–32)

## 2018-03-19 LAB — CBC
HCT: 29.4 % — ABNORMAL LOW (ref 39.0–52.0)
Hemoglobin: 9.9 g/dL — ABNORMAL LOW (ref 13.0–17.0)
MCH: 32.7 pg (ref 26.0–34.0)
MCHC: 33.7 g/dL (ref 30.0–36.0)
MCV: 97 fL (ref 78.0–100.0)
Platelets: 124 10*3/uL — ABNORMAL LOW (ref 150–400)
RBC: 3.03 MIL/uL — ABNORMAL LOW (ref 4.22–5.81)
RDW: 14 % (ref 11.5–15.5)
WBC: 20.9 10*3/uL — ABNORMAL HIGH (ref 4.0–10.5)

## 2018-03-19 LAB — COOXEMETRY PANEL
Carboxyhemoglobin: 1.9 % — ABNORMAL HIGH (ref 0.5–1.5)
Methemoglobin: 1.1 % (ref 0.0–1.5)
O2 Saturation: 86.1 %
Total hemoglobin: 10.1 g/dL — ABNORMAL LOW (ref 12.0–16.0)

## 2018-03-19 MED ORDER — POTASSIUM CHLORIDE 10 MEQ/50ML IV SOLN
10.0000 meq | INTRAVENOUS | Status: AC
  Administered 2018-03-19 (×3): 10 meq via INTRAVENOUS
  Filled 2018-03-19: qty 50

## 2018-03-19 MED ORDER — FUROSEMIDE 10 MG/ML IJ SOLN
40.0000 mg | Freq: Every day | INTRAMUSCULAR | Status: DC
  Administered 2018-03-20 – 2018-03-21 (×2): 40 mg via INTRAVENOUS
  Filled 2018-03-19 (×2): qty 4

## 2018-03-19 MED ORDER — HYPROMELLOSE (GONIOSCOPIC) 2.5 % OP SOLN
1.0000 [drp] | OPHTHALMIC | Status: DC | PRN
  Filled 2018-03-19: qty 15

## 2018-03-19 MED ORDER — LEVALBUTEROL HCL 1.25 MG/0.5ML IN NEBU: 1.2500 mg | INHALATION_SOLUTION | Freq: Four times a day (QID) | RESPIRATORY_TRACT | Status: DC | PRN

## 2018-03-19 MED ORDER — FUROSEMIDE 10 MG/ML IJ SOLN: 80.0000 mg | Freq: Every day | INTRAMUSCULAR | Status: DC

## 2018-03-19 MED ORDER — SODIUM CHLORIDE 0.9 % IV SOLN
1.0000 g | Freq: Three times a day (TID) | INTRAVENOUS | Status: DC
  Administered 2018-03-19 – 2018-03-20 (×3): 1 g via INTRAVENOUS
  Filled 2018-03-19 (×4): qty 1

## 2018-03-19 MED ORDER — POTASSIUM CHLORIDE 10 MEQ/50ML IV SOLN: 10.0000 meq | INTRAVENOUS | Status: DC | PRN

## 2018-03-19 MED ORDER — POTASSIUM CHLORIDE 10 MEQ/50ML IV SOLN
10.0000 meq | INTRAVENOUS | Status: DC
  Administered 2018-03-19 (×3): 10 meq via INTRAVENOUS
  Filled 2018-03-19: qty 50

## 2018-03-19 NOTE — Op Note (Signed)
NAME: Robert [name], Robert [name] MEDICAL RECORD [id number] ACCOUNT [id number] DATE OF BIRTH:February 20, 1941 FACILITY: [facility] LOCATION: [facility] PHYSICIAN:Shaguana Love VAN TRIGT III, MD  OPERATIVE REPORT  DATE OF PROCEDURE:  03/16/2018  OPERATION: 1.  Coronary artery bypass grafting x4 (left internal mammary artery to left anterior descending, saphenous vein graft to diagonal, saphenous vein graft to ramus intermedius, saphenous vein graft to posterior descending). 2.  Endoscopic harvest of right leg greater saphenous vein.  SURGEON:  Len Childs, MD  ASSISTANT:  Jadene Pierini, PA-C  PREOPERATIVE DIAGNOSES: 1.  Recent non-ST elevation myocardial infarction in June 2019 with ischemic cardiomyopathy and severe 2-vessel coronary artery disease in a diabetic pattern.   2.  Ejection fraction 25% to 30%.  POSTOPERATIVE DIAGNOSES: 1.  Recent non-ST elevation myocardial infarction in June 2019 with ischemic cardiomyopathy and severe 2-vessel coronary artery disease in a diabetic pattern.   2.  Ejection fraction 25% to 30%.    ANESTHESIA:  General.  CLINICAL NOTE:  The patient is a 77 year old obese, diabetic, recently reformed smoker who presents with outside cardiac cath films and echocardiogram images.  [place] around [place] and had an [place].  He underwent cardiac  catheterization at that time showing severe coronary disease and severe LV dysfunction.  He was referred to cardiology here and was evaluated and treated at the outpatient heart failure clinic.  He was started on Entresto by Dr. Aundra Dubin.  He had an MRI  viability study which showed viability even though his ejection fraction was significantly impaired.  He underwent right heart catheterization which showed fairly well compensated filling pressures and borderline cardiac index at 1.75.  I saw the patient  in consultation for surgical revascularization and felt that he would be a candidate but at increased risk  due to his diabetes, suboptimal targets for grafting, poor LV function, and poorly controlled diabetes and chronic lung disease.  His pulmonary  function was treated with a course of antibiotics, a course of oral steroids, and mucolytic and bronchodilator therapies.  His cardiac function clinically improved with medications from the advanced heart failure clinic.  He was scheduled for surgery at  which time he understood he was at increased risk, but that surgical coronary revascularization was his only long-term therapy for his severe multivessel coronary disease with his reduced LV function; specifically,  we discussed the risks of stroke,  bleeding, blood transfusion, postoperative pulmonary problems, postoperative organ failure, postoperative infection, and death.  He demonstrated his understanding and agreed to proceed with surgery under what I felt was informed consent.  OPERATIVE FINDINGS: 1.  Severe diffuse coronary artery disease, difficult targets. 2.  Adequate conduit. 3.  Improved global LV function after separation from cardiopulmonary bypass by TEE.  DESCRIPTION OF PROCEDURE:  The patient was brought from the preop holding area where informed consent was documented and final questions were addressed.  He was placed supine on the operating table and general anesthesia was induced.  He remained stable.   A transesophageal echo probe was placed.  The patient was prepped and draped as a sterile field.  A proper time-out was performed.  A sternal incision was made as the saphenous vein was harvested endoscopically from the right leg.  The left internal  mammary artery was harvested as a pedicle graft.  It is 1.5 mm vessel with good flow.  The sternal retractor was placed using the deep blades because of the patient's obese body habitus.  The pericardium was opened and suspended.  The heart was enlarged.   There was some scarring in the inferior wall and inferior apical wall.  Pursestrings were  placed in the ascending aorta and right atrium.  After the vein was harvested, heparin was administered, and the patient was cannulated and placed on bypass with  therapeutic ACT.  The coronary targets were identified, and the mammary artery and vein grafts were prepared for the distal anastomosis.  Cardioplegic cannulas were placed both antegrade and retrograde cold blood cardioplegia.  The patient was cooled to  32 degrees, and the aortic crossclamp was applied.  One liter of cold blood cardioplegia was delivered in split doses between the antegrade aortic and retrograde coronary sinus catheters.  There was good cardioplegic arrest, and supple temperature  dropped less than 12 degrees.  Cardioplegia was delivered every 20 minutes.  The distal coronary anastomoses were performed.  The first distal anastomosis was to the posterior descending branch of the right coronary.  This was totally occluded proximally.  It was a 1.4 mm vessel.  Reverse saphenous vein was sewn with running 7-0  Prolene with satisfactory flow through the graft.  Cardioplegia was redosed.  The second distal anastomosis was to the ramus intermedius branch of the left coronary.  This was a 1.5 mm vessel with proximal 95% stenosis.  Reverse saphenous vein was sewn end-to-side with running 7-0 Prolene with good flow through the graft.   Cardioplegia was redosed.  The third distal anastomosis was to the diagonal branch of the LAD.  This was a 1.2 mm vessel, proximal 95% stenosis.  Reverse saphenous vein was sewn end-to-side with running 7-0 Prolene with good flow through the graft.  Cardioplegia was redosed.  The fourth distal anastomosis was then placed to the mid to distal LAD.  The left IMA pedicle was brought through an opening in the left lateral pericardium and was brought down onto the LAD and sewn end-to-side with running 8-0 Prolene.  There was good  flow through the anastomosis after briefly releasing the pedicle bulldog and  the mammary artery.  The bulldog was reapplied, and the pedicle was secured to the epicardium.  With the cross clamp still in place, 3 proximal vein anastomoses were performed on the ascending aorta with a 4.5 mm punch and running 6-0 Prolene.  Prior to tying down the final proximal anastomosis, air was vented from the coronaries with a dose of  retrograde warm blood cardioplegia.  The crossclamp was removed.  The heart resumed a spontaneous rhythm.  The vein grafts were deaired and opened, and each had good flow.  Hemostasis was documented at the proximal and distal sites.  The patient was rewarmed and reperfused.  Low-dose milrinone and norepinephrine and  epinephrine were started.  The lungs were expanded.  Ventilator was resumed.  After the patient was adequately rewarmed and reperfused, he was weaned from cardiopulmonary bypass without difficulty.  Echo showed globally improved LV function.   Hemodynamics were stable.  Protamine was administered without adverse reaction.  The cannulas were removed.  The mediastinum was irrigated.  The superior pericardial fat was closed over the aorta and vein grafts.  The anterior mediastinum and left  pleural chest tubes were placed and brought out through separate incisions.  The sternum was closed with a wire.  The patient remained stable.  The pectoralis fascia was closed with a running #1 Vicryl, and subcutaneous and skin layers were closed with  running Vicryl.  Total cardiopulmonary bypass time was 128 minutes.  LN/NUANCE  D:03/19/2018 T:03/19/2018 JOB:[id number]

## 2018-03-19 NOTE — Progress Notes (Signed)
3 Days Post-Op Procedure(s) (LRB): CORONARY ARTERY BYPASS GRAFTING (CABG) x 4, LIMA-LAD, SVG-RAMUS, SVG-PD, SVG-DIAG, USING LEFT INTERNAL MAMMARY ARTERY AND RIGHT GREATER SAPHENOUS VEIN HARVESTED ENDOSCOPICALLY (N/A) TRANSESOPHAGEAL ECHOCARDIOGRAM (TEE) (N/A) Subjective: Wet cough with Wbc 20k, possible infiltrate on CXR- start Fortaz Creat 1.7- drop lasix dose DN well controlled nsr  Objective: Vital signs in last 24 hours: Temp:  [97.4 F (36.3 C)-98.9 F (37.2 C)] 98.4 F (36.9 C) (09/15 0830) Pulse Rate:  [37-95] 74 (09/15 0700) Cardiac Rhythm: Normal sinus rhythm (09/15 0400) Resp:  [12-24] 12 (09/15 0700) BP: (97-174)/(47-103) 97/76 (09/15 0700) SpO2:  [92 %-98 %] 94 % (09/15 0950) Weight:  [97.5 kg] 97.5 kg (09/15 0500)  Hemodynamic parameters for last 24 hours:    Intake/Output from previous day: 09/14 0701 - 09/15 0700 In: 784.2 [P.O.:570; I.V.:214.2] Out: 1833 [HMCNO:7096] Intake/Output this shift: No intake/output data recorded.       Exam    General- alert and comfortable    Neck- no JVD, no cervical adenopathy palpable, no carotid bruit   Lungs- clear without rales, wheezes   Cor- regular rate and rhythm, no murmur , gallop   Abdomen- soft, non-tender   Extremities - warm, non-tender, minimal edema   Neuro- oriented, appropriate, no focal weakness   Lab Results: Recent Labs    03/18/18 0442 03/18/18 1644 03/19/18 0434  WBC 26.0*  --  20.9*  HGB 11.1* 10.5* 9.9*  HCT 33.1* 31.0* 29.4*  PLT 138*  --  124*   BMET:  Recent Labs    03/18/18 0442 03/18/18 1644 03/19/18 0434  NA 133* 137 137  K 4.3 4.3 3.6  CL 100 100 101  CO2 23  --  25  GLUCOSE 153* 142* 122*  BUN 14 25* 28*  CREATININE 1.36* 1.50* 1.80*  CALCIUM 8.2*  --  8.0*    PT/INR:  Recent Labs    03/16/18 1346  LABPROT 17.0*  INR 1.39   ABG    Component Value Date/Time   PHART 7.298 (L) 03/16/2018 1848   HCO3 22.2 03/16/2018 1848   TCO2 26 03/18/2018 1644   ACIDBASEDEF  4.0 (H) 03/16/2018 1848   O2SAT 86.1 03/19/2018 0447   CBG (last 3)  Recent Labs    03/18/18 2346 03/19/18 0444 03/19/18 0739  GLUCAP 141* 119* 122*    Assessment/Plan: S/P Procedure(s) (LRB): CORONARY ARTERY BYPASS GRAFTING (CABG) x 4, LIMA-LAD, SVG-RAMUS, SVG-PD, SVG-DIAG, USING LEFT INTERNAL MAMMARY ARTERY AND RIGHT GREATER SAPHENOUS VEIN HARVESTED ENDOSCOPICALLY (N/A) TRANSESOPHAGEAL ECHOCARDIOGRAM (TEE) (N/A) Mobilize Diuresis Diabetes control cont milrinone for low EF ischemic CM   LOS: 3 days    Robert [name] 03/19/2018

## 2018-03-19 NOTE — Progress Notes (Signed)
CT surgery p.m. Rounds  Patient examined and record reviewed.Hemodynamics stable,labs satisfactory.Patient had stable day.Continue current care. Robert [name] 03/19/2018

## 2018-03-19 NOTE — Plan of Care (Signed)
  Problem: Education: Goal: Knowledge of General Education information will improve Description Including pain rating scale, medication(s)/side effects and non-pharmacologic comfort measures Outcome: Progressing   Problem: Health Behavior/Discharge Planning: Goal: Ability to manage health-related needs will improve Outcome: Progressing   Problem: Coping: Goal: Level of anxiety will decrease Outcome: Progressing   Problem: Elimination: Goal: Will not experience complications related to urinary retention Outcome: Progressing   Problem: Pain Managment: Goal: General experience of comfort will improve Outcome: Progressing   Problem: Activity: Goal: Risk for activity intolerance will decrease Outcome: Progressing   Problem: Cardiac: Goal: Will achieve and/or maintain hemodynamic stability Outcome: Progressing   Problem: Respiratory: Goal: Respiratory status will improve Outcome: Progressing

## 2018-03-20 ENCOUNTER — Inpatient Hospital Stay (HOSPITAL_COMMUNITY): Payer: Medicare Other

## 2018-03-20 LAB — BPAM RBC
Blood Product Expiration Date: 201910042359
Blood Product Expiration Date: 201910042359
ISSUE DATE / TIME: 201909121016
ISSUE DATE / TIME: 201909121016
Unit Type and Rh: 7300
Unit Type and Rh: 7300

## 2018-03-20 LAB — COOXEMETRY PANEL
Carboxyhemoglobin: 1.4 % (ref 0.5–1.5)
Methemoglobin: 0.9 % (ref 0.0–1.5)
O2 Saturation: 53.1 %
Total hemoglobin: 15.8 g/dL (ref 12.0–16.0)

## 2018-03-20 LAB — BASIC METABOLIC PANEL
Anion gap: 11 (ref 5–15)
BUN: 39 mg/dL — ABNORMAL HIGH (ref 8–23)
CO2: 24 mmol/L (ref 22–32)
Calcium: 7.9 mg/dL — ABNORMAL LOW (ref 8.9–10.3)
Chloride: 100 mmol/L (ref 98–111)
Creatinine, Ser: 1.75 mg/dL — ABNORMAL HIGH (ref 0.61–1.24)
GFR calc Af Amer: 42 mL/min — ABNORMAL LOW (ref >60)
GFR calc non Af Amer: 37 mL/min — ABNORMAL LOW (ref >60)
Glucose, Bld: 113 mg/dL — ABNORMAL HIGH (ref 70–99)
Potassium: 3.3 mmol/L — ABNORMAL LOW (ref 3.5–5.1)
Sodium: 135 mmol/L (ref 135–145)

## 2018-03-20 LAB — TYPE AND SCREEN
ABO/RH(D): B POS
Antibody Screen: NEGATIVE
Unit division: 0
Unit division: 0

## 2018-03-20 LAB — CBC
HCT: 29.7 % — ABNORMAL LOW (ref 39.0–52.0)
Hemoglobin: 10 g/dL — ABNORMAL LOW (ref 13.0–17.0)
MCH: 32.2 pg (ref 26.0–34.0)
MCHC: 33.7 g/dL (ref 30.0–36.0)
MCV: 95.5 fL (ref 78.0–100.0)
Platelets: 169 10*3/uL (ref 150–400)
RBC: 3.11 MIL/uL — ABNORMAL LOW (ref 4.22–5.81)
RDW: 13.9 % (ref 11.5–15.5)
WBC: 14.5 10*3/uL — ABNORMAL HIGH (ref 4.0–10.5)

## 2018-03-20 LAB — GLUCOSE, CAPILLARY
Glucose-Capillary: 100 mg/dL — ABNORMAL HIGH (ref 70–99)
Glucose-Capillary: 108 mg/dL — ABNORMAL HIGH (ref 70–99)
Glucose-Capillary: 109 mg/dL — ABNORMAL HIGH (ref 70–99)
Glucose-Capillary: 112 mg/dL — ABNORMAL HIGH (ref 70–99)
Glucose-Capillary: 120 mg/dL — ABNORMAL HIGH (ref 70–99)
Glucose-Capillary: 121 mg/dL — ABNORMAL HIGH (ref 70–99)
Glucose-Capillary: 148 mg/dL — ABNORMAL HIGH (ref 70–99)

## 2018-03-20 MED ORDER — SODIUM CHLORIDE 0.9 % IV SOLN
1.0000 g | Freq: Two times a day (BID) | INTRAVENOUS | Status: DC
  Administered 2018-03-20 – 2018-03-22 (×4): 1 g via INTRAVENOUS
  Filled 2018-03-20 (×4): qty 1

## 2018-03-20 MED ORDER — POTASSIUM CHLORIDE CRYS ER 20 MEQ PO TBCR: 20.0000 meq | EXTENDED_RELEASE_TABLET | Freq: Two times a day (BID) | ORAL | Status: DC

## 2018-03-20 MED ORDER — INSULIN ASPART 100 UNIT/ML ~~LOC~~ SOLN
0.0000 [IU] | Freq: Three times a day (TID) | SUBCUTANEOUS | Status: DC
  Administered 2018-03-23: 2 [IU] via SUBCUTANEOUS
  Administered 2018-03-25: 3 [IU] via SUBCUTANEOUS

## 2018-03-20 MED ORDER — POTASSIUM CHLORIDE CRYS ER 20 MEQ PO TBCR
40.0000 meq | EXTENDED_RELEASE_TABLET | Freq: Two times a day (BID) | ORAL | Status: AC
  Administered 2018-03-20 – 2018-03-21 (×3): 40 meq via ORAL
  Filled 2018-03-20 (×3): qty 2

## 2018-03-20 MED ORDER — INSULIN ASPART 100 UNIT/ML ~~LOC~~ SOLN: 0.0000 [IU] | Freq: Every day | SUBCUTANEOUS | Status: DC

## 2018-03-20 MED ORDER — MAGNESIUM SULFATE 2 GM/50ML IV SOLN
2.0000 g | Freq: Once | INTRAVENOUS | Status: AC
  Administered 2018-03-20: 2 g via INTRAVENOUS
  Filled 2018-03-20: qty 50

## 2018-03-20 NOTE — Progress Notes (Signed)
PHARMACY NOTE:  ANTIMICROBIAL RENAL DOSAGE ADJUSTMENT  Current antimicrobial regimen includes a mismatch between antimicrobial dosage and estimated renal function.  As per policy approved by the Pharmacy & Therapeutics and Medical Executive Committees, the antimicrobial dosage will be adjusted accordingly.  Current antimicrobial dosage:  Fortaz 1g IV q8h  Indication: pneumonia  Renal Function: CrCl ~42 ml/min  Estimated Creatinine Clearance: 42.4 mL/min (A) (by C-G formula based on SCr of 1.75 mg/dL (H)). []      On intermittent HD, scheduled: []      On CRRT    Antimicrobial dosage has been changed to:  Fortaz 1g IV q12h  Additional comments:   Thank you for allowing pharmacy to be a part of this patient's care.  Arrie Senate, PharmD, BCPS Clinical Pharmacist [phone] Please check AMION for all [place] numbers 03/20/2018

## 2018-03-20 NOTE — Progress Notes (Signed)
4 Days Post-Op Procedure(s) (LRB): CORONARY ARTERY BYPASS GRAFTING (CABG) x 4, LIMA-LAD, SVG-RAMUS, SVG-PD, SVG-DIAG, USING LEFT INTERNAL MAMMARY ARTERY AND RIGHT GREATER SAPHENOUS VEIN HARVESTED ENDOSCOPICALLY (N/A) TRANSESOPHAGEAL ECHOCARDIOGRAM (TEE) (N/A) Subjective: Patient with persistent wet cough but has ambulated in entire unit.  Oxygen weaned down to 2 L Patient had bowel movement today Maintaining sinus rhythm with PACs Sternal incision clean and dry Weaning milrinone now down to 0.125 Objective: Vital signs in last 24 hours: Temp:  [98.3 F (36.8 C)-98.6 F (37 C)] 98.6 F (37 C) (09/16 1226) Pulse Rate:  [39-101] 88 (09/16 1500) Cardiac Rhythm: Normal sinus rhythm (09/16 1600) Resp:  [12-26] 26 (09/16 1500) BP: (91-206)/(40-104) 161/93 (09/16 1500) SpO2:  [90 %-97 %] 94 % (09/16 1500) Weight:  [96.4 kg] 96.4 kg (09/16 0444)  Hemodynamic parameters for last 24 hours:  Sinus rhythm afebrile  Intake/Output from previous day: 09/15 0701 - 09/16 0700 In: 2248.5 [P.O.:1540; I.V.:158; IV Piggyback:550.5] Out: 2086 [Urine:2085; Stool:1] Intake/Output this shift: Total I/O In: 41.6 [I.V.:41.6] Out: 1250 [Urine:1250]       Exam    General- alert and comfortable    Neck- no JVD, no cervical adenopathy palpable, no carotid bruit   Lungs- clear without rales, wheezes   Cor- regular rate and rhythm, no murmur , gallop   Abdomen- soft, non-tender   Extremities - warm, non-tender, minimal edema   Neuro- oriented, appropriate, no focal weakness   Lab Results: Recent Labs    03/19/18 0434 03/19/18 1715 03/20/18 0434  WBC 20.9*  --  14.5*  HGB 9.9* 9.5* 10.0*  HCT 29.4* 28.0* 29.7*  PLT 124*  --  169   BMET:  Recent Labs    03/19/18 0434 03/19/18 1715 03/20/18 0434  NA 137 135 135  K 3.6 3.5 3.3*  CL 101 101 100  CO2 25  --  24  GLUCOSE 122* 139* 113*  BUN 28* 35* 39*  CREATININE 1.80* 1.90* 1.75*  CALCIUM 8.0*  --  7.9*    PT/INR: No results for  input(s): LABPROT, INR in the last 72 hours. ABG    Component Value Date/Time   PHART 7.298 (L) 03/16/2018 1848   HCO3 22.2 03/16/2018 1848   TCO2 27 03/19/2018 1715   ACIDBASEDEF 4.0 (H) 03/16/2018 1848   O2SAT 53.1 03/20/2018 0430   CBG (last 3)  Recent Labs    03/20/18 0335 03/20/18 0814 03/20/18 1228  GLUCAP 109* 108* 120*    Assessment/Plan: S/P Procedure(s) (LRB): CORONARY ARTERY BYPASS GRAFTING (CABG) x 4, LIMA-LAD, SVG-RAMUS, SVG-PD, SVG-DIAG, USING LEFT INTERNAL MAMMARY ARTERY AND RIGHT GREATER SAPHENOUS VEIN HARVESTED ENDOSCOPICALLY (N/A) TRANSESOPHAGEAL ECHOCARDIOGRAM (TEE) (N/A) Wean milrinone, continue mobilization and careful diuresis DC Foley in a.m. and probably transfer to stepdown  LOS: 4 days    Robert [name] 03/20/2018

## 2018-03-20 NOTE — Progress Notes (Addendum)
Patient had an 11 beat run of V-tach during ambulation. Patient asymptomatic during event. Will notify Dr. Prescott Gum. Will continue to monitor. - Zanden Colver,RN   Dr. Prescott Gum returned phone call. Stated he would place new order for magnesium.

## 2018-03-21 ENCOUNTER — Inpatient Hospital Stay (HOSPITAL_COMMUNITY): Payer: Medicare Other

## 2018-03-21 LAB — POCT I-STAT, CHEM 8
BUN: 12 mg/dL (ref 8–23)
Calcium, Ion: 1.08 mmol/L — ABNORMAL LOW (ref 1.15–1.40)
Chloride: 104 mmol/L (ref 98–111)
Creatinine, Ser: 1.1 mg/dL (ref 0.61–1.24)
Glucose, Bld: 157 mg/dL — ABNORMAL HIGH (ref 70–99)
HCT: 31 % — ABNORMAL LOW (ref 39.0–52.0)
Hemoglobin: 10.5 g/dL — ABNORMAL LOW (ref 13.0–17.0)
Potassium: 4.5 mmol/L (ref 3.5–5.1)
Sodium: 142 mmol/L (ref 135–145)
TCO2: 24 mmol/L (ref 22–32)

## 2018-03-21 LAB — GLUCOSE, CAPILLARY
Glucose-Capillary: 100 mg/dL — ABNORMAL HIGH (ref 70–99)
Glucose-Capillary: 117 mg/dL — ABNORMAL HIGH (ref 70–99)
Glucose-Capillary: 119 mg/dL — ABNORMAL HIGH (ref 70–99)
Glucose-Capillary: 73 mg/dL (ref 70–99)
Glucose-Capillary: 83 mg/dL (ref 70–99)
Glucose-Capillary: 91 mg/dL (ref 70–99)
Glucose-Capillary: 95 mg/dL (ref 70–99)

## 2018-03-21 LAB — CBC
HCT: 30 % — ABNORMAL LOW (ref 39.0–52.0)
Hemoglobin: 9.8 g/dL — ABNORMAL LOW (ref 13.0–17.0)
MCH: 31.5 pg (ref 26.0–34.0)
MCHC: 32.7 g/dL (ref 30.0–36.0)
MCV: 96.5 fL (ref 78.0–100.0)
Platelets: 165 10*3/uL (ref 150–400)
RBC: 3.11 MIL/uL — ABNORMAL LOW (ref 4.22–5.81)
RDW: 14.1 % (ref 11.5–15.5)
WBC: 8.3 10*3/uL (ref 4.0–10.5)

## 2018-03-21 LAB — BASIC METABOLIC PANEL
Anion gap: 11 (ref 5–15)
BUN: 43 mg/dL — ABNORMAL HIGH (ref 8–23)
CO2: 25 mmol/L (ref 22–32)
Calcium: 7.9 mg/dL — ABNORMAL LOW (ref 8.9–10.3)
Chloride: 100 mmol/L (ref 98–111)
Creatinine, Ser: 1.42 mg/dL — ABNORMAL HIGH (ref 0.61–1.24)
GFR calc Af Amer: 55 mL/min — ABNORMAL LOW (ref >60)
GFR calc non Af Amer: 47 mL/min — ABNORMAL LOW (ref >60)
Glucose, Bld: 116 mg/dL — ABNORMAL HIGH (ref 70–99)
Potassium: 3.1 mmol/L — ABNORMAL LOW (ref 3.5–5.1)
Sodium: 136 mmol/L (ref 135–145)

## 2018-03-21 LAB — COOXEMETRY PANEL
Carboxyhemoglobin: 1.9 % — ABNORMAL HIGH (ref 0.5–1.5)
Methemoglobin: 1 % (ref 0.0–1.5)
O2 Saturation: 65.6 %
Total hemoglobin: 10 g/dL — ABNORMAL LOW (ref 12.0–16.0)

## 2018-03-21 LAB — POTASSIUM: Potassium: 3.8 mmol/L (ref 3.5–5.1)

## 2018-03-21 LAB — MAGNESIUM: Magnesium: 2.7 mg/dL — ABNORMAL HIGH (ref 1.7–2.4)

## 2018-03-21 MED ORDER — POTASSIUM CHLORIDE 10 MEQ/50ML IV SOLN: 10.0000 meq | INTRAVENOUS | Status: AC

## 2018-03-21 MED ORDER — SODIUM CHLORIDE 0.9% FLUSH
3.0000 mL | Freq: Two times a day (BID) | INTRAVENOUS | Status: DC
  Administered 2018-03-21 – 2018-03-25 (×9): 3 mL via INTRAVENOUS

## 2018-03-21 MED ORDER — FUROSEMIDE 40 MG PO TABS
40.0000 mg | ORAL_TABLET | Freq: Every day | ORAL | Status: DC
  Administered 2018-03-22: 40 mg via ORAL
  Filled 2018-03-21: qty 1

## 2018-03-21 MED ORDER — HYDRALAZINE HCL 10 MG PO TABS
10.0000 mg | ORAL_TABLET | Freq: Three times a day (TID) | ORAL | Status: DC
  Administered 2018-03-21 – 2018-03-23 (×5): 10 mg via ORAL
  Filled 2018-03-21 (×8): qty 1

## 2018-03-21 MED ORDER — POTASSIUM CHLORIDE CRYS ER 20 MEQ PO TBCR
20.0000 meq | EXTENDED_RELEASE_TABLET | ORAL | Status: DC | PRN
  Filled 2018-03-21: qty 1

## 2018-03-21 MED ORDER — MOVING RIGHT ALONG BOOK
Freq: Once | Status: DC
  Filled 2018-03-21: qty 1

## 2018-03-21 MED ORDER — MAGNESIUM HYDROXIDE 400 MG/5ML PO SUSP: 30.0000 mL | Freq: Every day | ORAL | Status: DC | PRN

## 2018-03-21 MED ORDER — SODIUM CHLORIDE 0.9 % IV SOLN: 250.0000 mL | INTRAVENOUS | Status: DC | PRN

## 2018-03-21 MED ORDER — CARVEDILOL 12.5 MG PO TABS
12.5000 mg | ORAL_TABLET | Freq: Two times a day (BID) | ORAL | Status: DC
  Administered 2018-03-21 – 2018-03-22 (×4): 12.5 mg via ORAL
  Filled 2018-03-21 (×4): qty 1

## 2018-03-21 MED ORDER — SODIUM CHLORIDE 0.9% FLUSH: 3.0000 mL | INTRAVENOUS | Status: DC | PRN

## 2018-03-21 MED ORDER — POTASSIUM CHLORIDE 10 MEQ/50ML IV SOLN
10.0000 meq | INTRAVENOUS | Status: AC | PRN
  Administered 2018-03-21 (×3): 10 meq via INTRAVENOUS
  Filled 2018-03-21 (×3): qty 50

## 2018-03-21 MED FILL — Heparin Sodium (Porcine) Inj 1000 Unit/ML: INTRAMUSCULAR | Qty: 30 | Status: AC

## 2018-03-21 MED FILL — Sodium Bicarbonate IV Soln 8.4%: INTRAVENOUS | Qty: 50 | Status: AC

## 2018-03-21 MED FILL — Electrolyte-R (PH 7.4) Solution: INTRAVENOUS | Qty: 3000 | Status: AC

## 2018-03-21 MED FILL — Sodium Chloride IV Soln 0.9%: INTRAVENOUS | Qty: 2000 | Status: AC

## 2018-03-21 MED FILL — Mannitol IV Soln 20%: INTRAVENOUS | Qty: 500 | Status: AC

## 2018-03-21 MED FILL — Lidocaine HCl(Cardiac) IV PF Soln Pref Syr 100 MG/5ML (2%): INTRAVENOUS | Qty: 5 | Status: AC

## 2018-03-21 NOTE — Progress Notes (Addendum)
Patient ID: ZIAIRE HAGOS, male   DOB: 13-Nov-1940, 77 y.o.   MRN: [id number] EVENING ROUNDS NOTE :     [address]       [place] [address]             [phone]                 5 Days Post-Op Procedure(s) (LRB): CORONARY ARTERY BYPASS GRAFTING (CABG) x 4, LIMA-LAD, SVG-RAMUS, SVG-PD, SVG-DIAG, USING LEFT INTERNAL MAMMARY ARTERY AND RIGHT GREATER SAPHENOUS VEIN HARVESTED ENDOSCOPICALLY (N/A) TRANSESOPHAGEAL ECHOCARDIOGRAM (TEE) (N/A)  Total Length of Stay:  LOS: 5 days  BP (!) 151/48   Pulse (!) 36   Temp 98.7 F (37.1 C) (Oral)   Resp 19   Ht 5\' 9"  (1.753 m)   Wt 93.6 kg   SpO2 94%   BMI 30.47 kg/m   .Intake/Output      09/16 0701 - 09/17 0700 09/17 0701 - 09/18 0700   P.O. 420 480   I.V. (mL/kg) 158.2 (1.7) 9.3 (0.1)   IV Piggyback 0 330.4   Total Intake(mL/kg) 578.2 (6.2) 819.7 (8.8)   Urine (mL/kg/hr) 2380 (1.1)    Stool 0    Total Output 2380    Net -1801.8 +819.7        Urine Occurrence  4 x   Stool Occurrence 7 x 4 x     . sodium chloride    . sodium chloride    . cefTAZidime (FORTAZ)  IV Stopped (03/21/18 1019)     Lab Results  Component Value Date   WBC 8.3 03/21/2018   HGB 9.8 (L) 03/21/2018   HCT 30.0 (L) 03/21/2018   PLT 165 03/21/2018   GLUCOSE 116 (H) 03/21/2018   CHOL 98 01/27/2018   TRIG 204 (H) 01/27/2018   HDL 29 (L) 01/27/2018   LDLCALC 28 01/27/2018   ALT 26 03/10/2018   AST 20 03/10/2018   NA 136 03/21/2018   K 3.1 (L) 03/21/2018   CL 100 03/21/2018   CREATININE 1.42 (H) 03/21/2018   BUN 43 (H) 03/21/2018   CO2 25 03/21/2018   INR 1.39 03/16/2018   HGBA1C 7.1 (H) 03/10/2018   Improving , waiting for step down bed 5-6 small loose stools today, abdomen soft , non tender  Currently off antibiotics  All stool softeners stopped today  Grace Isaac MD  [place] Office [phone] 03/21/2018 6:18 PM

## 2018-03-21 NOTE — Plan of Care (Signed)
Neuro: Pt A&O X4 and able to follow commands at this time. Neuro exam WNL. Will continue to monitor.    Respiratory: Pt remains on RA, O2 sats WNL. Pt using IS with productive cough.  Cardiovascular: Pt remains in sinus rhythm with frequent ectopy noted. Pts BP elevated at times, milrinone stopped and Po meds given. No PRN blood pressure medication given throughout shift. Pt afebrile with non pitting lower extremity edema.   GI/GU: Pt using urinal with adequate urine output. Pt with multiple loose BMs throughout shift, all bowel regimen medications held. Pt reporting difficulty swallowing at times so speech eval was placed.   Skin: Skin intact with no s/s of skin breakdown at this time. Pts dressing on chest found to be saturated. Dressing changed and pt given instructions multiple times to use heart pillow. Pt found several times not using as directed.   Pain: Pt reported no pain and no PRN medications given.   Events: Pt unable to walk today due to frequent loose stools. Pt was unable to make it to the bathroom and had several accidents on floor. Pt refusing to walk at this time due to fear of accident in the hallway. NO acute events throughout shift. Pts plan of care to continue with current regimen, Family updated and no further questions at this time.

## 2018-03-21 NOTE — Progress Notes (Addendum)
[address]       [place] [address]             [phone]      5 Days Post-Op Procedure(s) (LRB): CORONARY ARTERY BYPASS GRAFTING (CABG) x 4, LIMA-LAD, SVG-RAMUS, SVG-PD, SVG-DIAG, USING LEFT INTERNAL MAMMARY ARTERY AND RIGHT GREATER SAPHENOUS VEIN HARVESTED ENDOSCOPICALLY (N/A) TRANSESOPHAGEAL ECHOCARDIOGRAM (TEE) (N/A) Subjective: Productive cough but less Pain well controlled  Objective: Vital signs in last 24 hours: Temp:  [98.2 F (36.8 C)-98.7 F (37.1 C)] 98.7 F (37.1 C) (09/17 0822) Pulse Rate:  [42-96] 80 (09/17 0700) Cardiac Rhythm: Normal sinus rhythm (09/16 1600) Resp:  [14-26] 18 (09/17 0600) BP: (126-183)/(50-125) 163/98 (09/17 0700) SpO2:  [87 %-95 %] 91 % (09/17 0736) Weight:  [93.6 kg] 93.6 kg (09/17 0500)  Hemodynamic parameters for last 24 hours:    Intake/Output from previous day: 09/16 0701 - 09/17 0700 In: 578.2 [P.O.:420; I.V.:158.2] Out: 2380 [Urine:2380] Intake/Output this shift: No intake/output data recorded.  General appearance: alert, cooperative and no distress Heart: regular rate and rhythm and frequent extrasystoles Lungs: coarse throughout Abdomen: obese, + Hyperactive BS, non-tender Extremities: + LE edema Wound: incis healing well  Lab Results: Recent Labs    03/20/18 0434 03/21/18 0540  WBC 14.5* 8.3  HGB 10.0* 9.8*  HCT 29.7* 30.0*  PLT 169 165   BMET:  Recent Labs    03/20/18 0434 03/21/18 0540  NA 135 136  K 3.3* 3.1*  CL 100 100  CO2 24 25  GLUCOSE 113* 116*  BUN 39* 43*  CREATININE 1.75* 1.42*  CALCIUM 7.9* 7.9*    PT/INR: No results for input(s): LABPROT, INR in the last 72 hours. ABG    Component Value Date/Time   PHART 7.298 (L) 03/16/2018 1848   HCO3 22.2 03/16/2018 1848   TCO2 27 03/19/2018 1715   ACIDBASEDEF 4.0 (H) 03/16/2018 1848   O2SAT 65.6 03/21/2018 0510   CBG (last 3)  Recent Labs    03/20/18 2009 03/20/18 2346 03/21/18 0357  GLUCAP 121* 119* 91     Meds Scheduled Meds: . acetaminophen  1,000 mg Oral Q6H   Or  . acetaminophen (TYLENOL) oral liquid 160 mg/5 mL  1,000 mg Per Tube Q6H  . amiodarone  200 mg Oral BID  . aspirin EC  325 mg Oral Daily   Or  . aspirin  324 mg Per Tube Daily  . bisacodyl  10 mg Oral Daily   Or  . bisacodyl  10 mg Rectal Daily  . carvedilol  12.5 mg Oral BID WC  . Chlorhexidine Gluconate Cloth  6 each Topical Daily  . docusate sodium  200 mg Oral Daily  . furosemide  40 mg Intravenous Daily  . guaiFENesin  600 mg Oral BID  . hydrALAZINE  10 mg Oral Q8H  . insulin aspart  0-15 Units Subcutaneous TID WC  . insulin aspart  0-5 Units Subcutaneous QHS  . insulin aspart  3 Units Subcutaneous TID WC  . insulin detemir  18 Units Subcutaneous BID  . metoCLOPramide (REGLAN) injection  10 mg Intravenous Q6H  . mometasone-formoterol  2 puff Inhalation BID  . moving right along book   Does not apply Once  . pantoprazole  40 mg Oral Daily  . pneumococcal 23 valent vaccine  0.5 mL Intramuscular Tomorrow-1000  . potassium chloride  40 mEq Oral BID  . sodium chloride flush  3 mL Intravenous Q12H  . sodium chloride flush  3 mL Intravenous Q12H   Continuous Infusions: . sodium chloride    . sodium chloride    . cefTAZidime (FORTAZ)  IV Stopped (03/20/18 2230)  . potassium chloride     PRN Meds:.sodium chloride, hydroxypropyl methylcellulose / hypromellose, levalbuterol, magnesium hydroxide, metoprolol tartrate, ondansetron (ZOFRAN) IV, oxyCODONE, potassium chloride, potassium chloride, sodium chloride flush, sodium chloride flush, traMADol  Xrays Dg Chest 2 View  Result Date: 03/20/2018 CLINICAL DATA:  Status post CABG.  Pleural effusions. EXAM: CHEST - 2 VIEW COMPARISON:  03/19/2018 and 03/10/2018 FINDINGS: Slight prominence of the cardiac silhouette. CABG. Sheath remains in the right jugular vein. There is a small kink in the sheath. The bilateral pleural effusions have diminished with minimal residual  fluid on the right and a small left effusion. Minimal atelectasis at the left base, improved. No pneumothorax.  No acute bone abnormality. IMPRESSION: Decreasing small bilateral pleural effusions, left greater than right. Focal kink in the sheath in the right jugular vein. Electronically Signed   By: Lorriane Shire M.D.   On: 03/20/2018 07:19    Assessment/Plan: S/P Procedure(s) (LRB): CORONARY ARTERY BYPASS GRAFTING (CABG) x 4, LIMA-LAD, SVG-RAMUS, SVG-PD, SVG-DIAG, USING LEFT INTERNAL MAMMARY ARTERY AND RIGHT GREATER SAPHENOUS VEIN HARVESTED ENDOSCOPICALLY (N/A) TRANSESOPHAGEAL ECHOCARDIOGRAM (TEE) (N/A)  1 overall progressing well. COOX 65, no inotropes 2 hypertensive, on coreg and hydralizine- monitor closely 3 freq PVC's, replace K+, received mag sulfate IV yesterday, EF 30-35% 4 creat improving trend, good UOP, diuresis 5 H/H is pretty stable, platelet count stable,no leukocytosis 6 cont aggressive pulm toilet, nebs, on fortaz as well 7 sugars well controlled, on insulin, may need metformin , monitor creat for now to see if van initiate. HgB A1c 7.1on no home meds 8 prob tx to 4E soon  LOS: 5 days    John Giovanni 03/21/2018 Pager [phone]  Multiple BMs Pulmonary status much better- co-ox 65 % tx to stepdown  patient examined and medical record reviewed,agree with above note. Tharon Aquas Trigt III 03/21/2018

## 2018-03-22 ENCOUNTER — Inpatient Hospital Stay (HOSPITAL_COMMUNITY): Payer: Medicare Other

## 2018-03-22 DIAGNOSIS — I5022 Chronic systolic (congestive) heart failure: Secondary | ICD-10-CM

## 2018-03-22 DIAGNOSIS — Z951 Presence of aortocoronary bypass graft: Secondary | ICD-10-CM

## 2018-03-22 LAB — CBC
HCT: 31.1 % — ABNORMAL LOW (ref 39.0–52.0)
Hemoglobin: 10.3 g/dL — ABNORMAL LOW (ref 13.0–17.0)
MCH: 31.8 pg (ref 26.0–34.0)
MCHC: 33.1 g/dL (ref 30.0–36.0)
MCV: 96 fL (ref 78.0–100.0)
Platelets: 194 10*3/uL (ref 150–400)
RBC: 3.24 MIL/uL — ABNORMAL LOW (ref 4.22–5.81)
RDW: 14.1 % (ref 11.5–15.5)
WBC: 11.6 10*3/uL — ABNORMAL HIGH (ref 4.0–10.5)

## 2018-03-22 LAB — GLUCOSE, CAPILLARY
Glucose-Capillary: 80 mg/dL (ref 70–99)
Glucose-Capillary: 80 mg/dL (ref 70–99)
Glucose-Capillary: 85 mg/dL (ref 70–99)
Glucose-Capillary: 99 mg/dL (ref 70–99)
Glucose-Capillary: 98 mg/dL (ref 70–99)
Glucose-Capillary: 107 mg/dL — ABNORMAL HIGH (ref 70–99)

## 2018-03-22 LAB — BASIC METABOLIC PANEL
Anion gap: 10 (ref 5–15)
BUN: 43 mg/dL — ABNORMAL HIGH (ref 8–23)
CO2: 25 mmol/L (ref 22–32)
Calcium: 8.2 mg/dL — ABNORMAL LOW (ref 8.9–10.3)
Chloride: 103 mmol/L (ref 98–111)
Creatinine, Ser: 1.33 mg/dL — ABNORMAL HIGH (ref 0.61–1.24)
GFR calc Af Amer: 59 mL/min — ABNORMAL LOW (ref >60)
GFR calc non Af Amer: 51 mL/min — ABNORMAL LOW (ref >60)
Glucose, Bld: 93 mg/dL (ref 70–99)
Potassium: 4 mmol/L (ref 3.5–5.1)
Sodium: 138 mmol/L (ref 135–145)

## 2018-03-22 MED ORDER — METOCLOPRAMIDE HCL 5 MG/ML IJ SOLN
10.0000 mg | Freq: Two times a day (BID) | INTRAMUSCULAR | Status: DC
  Administered 2018-03-22 – 2018-03-25 (×6): 10 mg via INTRAVENOUS
  Filled 2018-03-22 (×6): qty 2

## 2018-03-22 MED ORDER — ATORVASTATIN CALCIUM 40 MG PO TABS
40.0000 mg | ORAL_TABLET | Freq: Every day | ORAL | Status: DC
  Administered 2018-03-22 – 2018-03-24 (×3): 40 mg via ORAL
  Filled 2018-03-22 (×3): qty 1

## 2018-03-22 MED ORDER — SACUBITRIL-VALSARTAN 24-26 MG PO TABS
1.0000 | ORAL_TABLET | Freq: Two times a day (BID) | ORAL | Status: DC
  Administered 2018-03-22 – 2018-03-23 (×3): 1 via ORAL
  Filled 2018-03-22 (×4): qty 1

## 2018-03-22 MED ORDER — FUROSEMIDE 40 MG PO TABS
40.0000 mg | ORAL_TABLET | Freq: Two times a day (BID) | ORAL | Status: DC
  Administered 2018-03-22: 40 mg via ORAL
  Filled 2018-03-22: qty 1

## 2018-03-22 MED ORDER — SODIUM CHLORIDE 0.9 % IV SOLN
1.0000 g | Freq: Three times a day (TID) | INTRAVENOUS | Status: DC
  Administered 2018-03-22 – 2018-03-25 (×9): 1 g via INTRAVENOUS
  Filled 2018-03-22 (×12): qty 1

## 2018-03-22 MED ORDER — CARVEDILOL 6.25 MG PO TABS
6.2500 mg | ORAL_TABLET | Freq: Two times a day (BID) | ORAL | Status: DC
  Administered 2018-03-23 – 2018-03-25 (×5): 6.25 mg via ORAL
  Filled 2018-03-22 (×5): qty 1

## 2018-03-22 MED ORDER — SPIRONOLACTONE 12.5 MG HALF TABLET
12.5000 mg | ORAL_TABLET | Freq: Every day | ORAL | Status: DC
  Administered 2018-03-22 – 2018-03-23 (×2): 12.5 mg via ORAL
  Filled 2018-03-22 (×2): qty 1

## 2018-03-22 MED ORDER — FUROSEMIDE 10 MG/ML IJ SOLN
40.0000 mg | Freq: Two times a day (BID) | INTRAMUSCULAR | Status: DC
  Administered 2018-03-22 – 2018-03-23 (×2): 40 mg via INTRAVENOUS
  Filled 2018-03-22 (×3): qty 4

## 2018-03-22 NOTE — Progress Notes (Signed)
CARDIAC REHAB PHASE I   Offered to walk with pt, pt declining stating he has walked twice already today with plans for a third around dinner time. Pt demonstrates 1000 on IS, encouraged continued use on IS and flutter. Will follow up tomorrow.  [phone] Rufina Falco, RN BSN 03/22/2018 2:40 PM

## 2018-03-22 NOTE — Consult Note (Addendum)
Advanced Heart Failure Team Consult Note   Primary Physician: System, Pcp Not In HF Cardiologist:  Dr Aundra Dubin  Reason for Consultation: Heart Failure   HPI:    Robert [name] is seen today for evaluation of heart failure at the request of Dr Darcey Nora.   Mr Robert [name] 77 yo with history of CAD, ischemic cardiomyopathy ,and former smoker. He had no cardiac history prior to 6/19. However, since around 12/18, he had noted exertional dyspnea.  In 6/19, [place].  He developed very severe dyspnea and went to the hospital in [place] where he was found to have NSTEMI.  LHC was done, showing chronic occlusions of the LAD and RCA with collaterals.  Echo showed EF 25-30%.  No intervention, he was eventually discharged to home.  He was seen by Dr. Prescott Gum for CABG evaluation with plans to optimize prior to CABG.  Once optimized he was set up for CABG.    Admitted for scheduled CABG on 9/12. He underwent CABG x4 lima-lad, SVG-ramus, SVG-PD, SVG-Diag. Post operative course has been stable. All drips weaned off.   Complaining of chest soreness. He remains somewhat SOB. Able to walk halls 2x/today. Had good BM the other day.   TEE 9/12  Left ventricle: Systolic function was moderately to severely   reduced. The estimated ejection fraction was in the range of 30%   to 35%. Diffuse hypokinesis. Akinesis of the inferoseptal   myocardium. Ejection fraction (1-plane Simpson&'s A4C): 34%. - Mitral valve: There was mild regurgitation originating from the   central commissure and directed posteriorly. - Right atrium: The atrium was normal in size. There was a 4.7 x3.6   fixed mass/thrombus in the atrial cavity, at the upper margin of   the intra-atrial septum. - Staged echo: Limited post CPB exam: There is interval Improvement   in LV function after CPB. There is some recovery of inferior and   septal contractility. The overall EF appears improved to 40-45%.  There is no change in the appearance of the Right Atrial mass. No   change post bypass in aortic valve function. No change post   bypass in mitral valve function. Impressions: - LV function improvement from pre-bypass images. No other   significant changes from pre-bypass images.  -Echo (6/19): LV moderately dilated, EF 25-30%, mild MR.  - Cardiac MRI (6/19): EF 28%, significant viability noted.  - RHC (7/19): mean RA 8, PA 40/16 mean 26, mean PCWP 23, CI 1.7, PVR 0.83 WU  Review of Systems: [y] = yes, [ ]  = no   General: Weight gain [ ] ; Weight loss [ ] ; Anorexia [ ] ; Fatigue [Y ]; Fever [ ] ; Chills [ ] ; Weakness [ ]   Cardiac: Chest pain/pressure [Y ]; Resting SOB [Y ]; Exertional SOB [Y ]; Orthopnea [ ] ; Pedal Edema [ Y]; Palpitations [ ] ; Syncope [ ] ; Presyncope [ ] ; Paroxysmal nocturnal dyspnea[ ]   Pulmonary: Cough [ ] ; Wheezing[ ] ; Hemoptysis[ ] ; Sputum [ ] ; Snoring [ ]   GI: Vomiting[ ] ; Dysphagia[ ] ; Melena[ ] ; Hematochezia [ ] ; Heartburn[ ] ; Abdominal pain [ ] ; Constipation [ ] ; Diarrhea [ ] ; BRBPR [ ]   GU: Hematuria[ ] ; Dysuria [ ] ; Nocturia[ ]   Vascular: Pain in legs with walking [ ] ; Pain in feet with lying flat [ ] ; Non-healing sores [ ] ; Stroke [ ] ; TIA [ ] ; Slurred speech [ ] ;  Neuro: Headaches[ ] ; Vertigo[ ] ; Seizures[ ] ; Paresthesias[ ] ;Blurred vision [ ] ; Diplopia [ ] ;  Vision changes [ ]   Ortho/Skin: Arthritis [ Y]; Joint pain [Y]; Muscle pain [ ] ; Joint swelling [ ] ; Back Pain [Y ]; Rash [ ]   Psych: Depression[ ] ; Anxiety[ ]   Heme: Bleeding problems [ ] ; Clotting disorders [ ] ; Anemia [ ]   Endocrine: Diabetes [ ] ; Thyroid dysfunction[ ]   Home Medications Prior to Admission medications   Medication Sig Start Date End Date Taking? Authorizing Provider  aspirin 81 MG chewable tablet Chew 81 mg by mouth daily. 12/21/17  Yes [provider]  atorvastatin (LIPITOR) 40 MG tablet Take 1 tablet (40 mg total) by mouth at bedtime. 01/17/18  Yes Jettie Booze, MD    carvedilol (COREG) 6.25 MG tablet Take 1 tablet (6.25 mg total) by mouth 2 (two) times daily. 01/17/18  Yes Jettie Booze, MD  digoxin (LANOXIN) 0.125 MG tablet Take 1 tablet (0.125 mg total) by mouth daily. 01/19/18  Yes [name], PA-C  furosemide (LASIX) 20 MG tablet Take 20 mg by mouth daily with supper.   Yes [provider]  furosemide (LASIX) 40 MG tablet Take 40 mg (1 tab) every morning, and 20 mg (1/2 tab) every evening. Patient taking differently: Take 40 mg by mouth daily with breakfast.  01/19/18  Yes Tillery, Satira Mccallum, PA-C  isosorbide mononitrate (IMDUR) 30 MG 24 hr tablet Take 1 tablet (30 mg total) by mouth daily. 01/17/18  Yes Jettie Booze, MD  Phenylephrine-DM-GG-APAP [facility] SINUS-MAX) 5-10-200-325 MG CAPS Take 1 tablet by mouth daily. Take daily until 03/13/18   Yes [provider]  Polyethyl Glycol-Propyl Glycol (SYSTANE) 0.4-0.3 % SOLN Place 1 drop into both eyes 2 (two) times daily.   Yes [provider]  sacubitril-valsartan (ENTRESTO) 24-26 MG Take 1 tablet by mouth 2 (two) times daily. 01/27/18  Yes Larey Dresser, MD  spironolactone (ALDACTONE) 25 MG tablet Take 0.5 tablets (12.5 mg total) by mouth daily. Patient taking differently: Take 12.5 mg by mouth daily with supper.  03/03/18 06/01/18 Yes Larey Dresser, MD  predniSONE (STERAPRED UNI-PAK 21 TAB) 10 MG (21) TBPK tablet Day 1 5 tablets Day 2 4 tablets Day 3 3 tablets Day 4 2 tablets Day 5 1 tablet Patient not taking: Reported on 03/09/2018 03/01/18   Ivin Poot, MD    Past Medical History: Past Medical History:  Diagnosis Date  . Childhood asthma   . Coronary artery disease   . Hay fever   . Hypertension   . Macular hole of left eye 12/23/2011  . Macular hole, right eye 03/05/2014  . Myocardial infarction [facility])    June 2019  . Neuromuscular disorder (HCC)    numbness in feet  . PONV (postoperative nausea and vomiting)   . Presence of external  cardiac defibrillator 2019  . 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 Sinus headache    "hay fever season; not that often"  . Transverse myelitis ([place]) 2009   was treated by Dr Erling Cruz  . Umbilical hernia     Past Surgical History: Past Surgical History:  Procedure Laterality Date  . [place] VITRECTOMY WITH 20 GAUGE MVR PORT FOR MACULAR HOLE Left 03/08/2013   Procedure: 25 GAUGE PARS PLANA VITRECTOMY WITH 20 GAUGE MVR PORT FOR MACULAR HOLE;  Surgeon: Hayden Pedro, MD;  Location: [place];  Service: Ophthalmology;  Laterality: Left;  . 25 GAUGE PARS PLANA VITRECTOMY WITH 20 GAUGE MVR PORT FOR MACULAR HOLE Right 04/02/2014   Procedure: 20-25  GAUGE PARS PLANA VITRECTOMY ; MEMBRANE PEEL; HEADSCOPE LASER; SERUM PATCH; GAS/FLUID EXCHANGE ;C3F8;  Surgeon: Hayden Pedro, MD;  Location: [place];  Service: Ophthalmology;  Laterality: Right;  . CATARACT EXTRACTION W/ INTRAOCULAR LENS  IMPLANT, BILATERAL Bilateral 2014  . CORONARY ARTERY BYPASS GRAFT N/A 03/16/2018   Procedure: CORONARY ARTERY BYPASS GRAFTING (CABG) x 4, LIMA-LAD, SVG-RAMUS, SVG-PD, SVG-DIAG, USING LEFT INTERNAL MAMMARY ARTERY AND RIGHT GREATER SAPHENOUS VEIN HARVESTED ENDOSCOPICALLY;  Surgeon: Ivin Poot, MD;  Location: [place];  Service: Open Heart Surgery;  Laterality: N/A;  . EYE SURGERY    . GAS INSERTION Left 03/08/2013   Procedure: INSERTION OF GAS;  Surgeon: Hayden Pedro, MD;  Location: [place];  Service: Ophthalmology;  Laterality: Left;  C3F8  . GAS INSERTION Right 05/07/2014   Procedure: INSERTION OF GAS (C3F8);  Surgeon: Hayden Pedro, MD;  Location: [place];  Service: Ophthalmology;  Laterality: Right;  . GAS/FLUID EXCHANGE Right 05/07/2014   Procedure: GAS/FLUID EXCHANGE;  Surgeon: Hayden Pedro, MD;  Location: [place];  Service: Ophthalmology;  Laterality: Right;  . LASER PHOTO ABLATION Right 05/07/2014   Procedure: LASER PHOTO ABLATION;  Surgeon: Hayden Pedro, MD;  Location: [place];   Service: Ophthalmology;  Laterality: Right;  . PARS PLANA VITRECTOMY  01/18/2012   Procedure: PARS PLANA VITRECTOMY WITH 25 GAUGE;  Surgeon: Hayden Pedro, MD;  Location: [place];  Service: Ophthalmology;  Laterality: Left;  25g. ppv for macular hole ; Laser treatment;Serum patch and Gas Injection(C3F8)  . PARS PLANA VITRECTOMY Right 05/07/2014   Procedure: PARS PLANA VITRECTOMY WITH 25 GAUGE;  Surgeon: Hayden Pedro, MD;  Location: [place];  Service: Ophthalmology;  Laterality: Right;  . PARS [place] VITRECTOMY W/ REPAIR OF MACULAR HOLE Right 04/02/2014  . PHOTOCOAGULATION WITH LASER Left 03/08/2013   Procedure: PHOTOCOAGULATION WITH LASER;  Surgeon: Hayden Pedro, MD;  Location: [place];  Service: Ophthalmology;  Laterality: Left;  Headscope   . REPAIR OF COMPLEX TRACTION RETINAL DETACHMENT Right 05/07/2014   Procedure: REPAIR OF COMPLEX TRACTION RETINAL DETACHMENT;  Surgeon: Hayden Pedro, MD;  Location: [place];  Service: Ophthalmology;  Laterality: Right;  . RIGHT HEART CATH N/A 01/19/2018   Procedure: RIGHT HEART CATH;  Surgeon: Larey Dresser, MD;  Location: [place] CV [facility];  Service: Cardiovascular;  Laterality: N/A;  . SCLERAL BUCKLE Right 05/07/2014   Procedure: SCLERAL BUCKLE;  Surgeon: Hayden Pedro, MD;  Location: [place];  Service: Ophthalmology;  Laterality: Right;  . TEE WITHOUT CARDIOVERSION N/A 03/16/2018   Procedure: TRANSESOPHAGEAL ECHOCARDIOGRAM (TEE);  Surgeon: Prescott Gum, Collier Salina, MD;  Location: [place];  Service: Open Heart Surgery;  Laterality: N/A;    Family History: Family History  Problem Relation Age of Onset  . Heart disease Mother   . Cancer Mother   . Heart attack Father     Social History: Social History   Socioeconomic History  . Marital status: Married    Spouse name: Not on file  . Number of children: Not on file  . Years of education: Not on file  . Highest education level: Not on file  Occupational History  . Not on file  Social Needs  . Financial  resource strain: Not on file  . Food insecurity:    Worry: Not on file    Inability: Not on file  . Transportation needs:    Medical: Not on file    Non-medical: Not on file  Tobacco Use  .  Smoking status: Former Smoker    Packs/day: 2.00    Years: 52.00    Pack years: 104.00    Types: Cigarettes    Last attempt to quit: 12/18/2017    Years since quitting: 0.2  . Smokeless tobacco: Never Used  Substance and Sexual Activity  . Alcohol use: No  . Drug use: No  . Sexual activity: Never  Lifestyle  . Physical activity:    Days per week: Not on file    Minutes per session: Not on file  . Stress: Not on file  Relationships  . Social connections:    Talks on phone: Not on file    Gets together: Not on file    Attends religious service: Not on file    Active member of club or organization: Not on file    Attends meetings of clubs or organizations: Not on file    Relationship status: Not on file  Other Topics Concern  . Not on file  Social History Narrative  . Not on file    Allergies:  Allergies  Allergen Reactions  . Fluorescein Hives and Itching  . Ivp Dye [Iodinated Diagnostic Agents] Hives, Itching and Other (See Comments)    Face got splotchy.    . Typhoid Vaccines Swelling  . Yellow Dye Hives and Itching    Dye in eye test  . Codeine Other (See Comments)    Puts me to sleep    Objective:    Vital Signs:   Temp:  [98.2 F (36.8 C)-98.7 F (37.1 C)] 98.4 F (36.9 C) (09/18 1104) Pulse Rate:  [31-96] 67 (09/18 1000) Resp:  [14-35] 22 (09/18 1000) BP: (110-175)/(48-156) 128/73 (09/18 1000) SpO2:  [87 %-99 %] 92 % (09/18 1000) Weight:  [92.5 kg] 92.5 kg (09/18 0600) Last BM Date: 03/22/18  Weight change: Filed Weights   03/20/18 0444 03/21/18 0500 03/22/18 0600  Weight: 96.4 kg 93.6 kg 92.5 kg    Intake/Output:   Intake/Output Summary (Last 24 hours) at 03/22/2018 1146 Last data filed at 03/22/2018 0800 Gross per 24 hour  Intake 815.91 ml  Output  500 ml  Net 315.91 ml      Physical Exam    General:   No resp difficulty. Sitting the chair.  HEENT: normal Neck: supple. JVP jaw . Carotids 2+ bilat; no bruits. No lymphadenopathy or thyromegaly appreciated. Cor: Sternal dressing ok PMI nondisplaced. Regular rate & rhythm. No rubs, gallops or murmurs. Sternal incision  Lungs: Crackles in the bases. No wheeze Abdomen: soft, nontender, + distended. No hepatosplenomegaly. No bruits or masses. Good bowel sounds. Extremities: no cyanosis, clubbing, rash, R and LLE 2+ edema Neuro: alert & oriented x 3, cranial nerves grossly intact. moves all 4 extremities w/o difficulty. Affect pleasant    Telemetry  NSR 60-70s with PVCs.    EKG    N/a   Labs   Basic Metabolic Panel: Recent Labs  Lab 03/16/18 1945  03/17/18 0345 03/17/18 1631  03/18/18 0442  03/19/18 0434 03/19/18 1715 03/20/18 0434 03/21/18 0540 03/21/18 1833 03/21/18 1959 03/22/18 0448  NA  --    < > 138  --    < > 133*   < > 137 135 135 136  --   --  138  K  --    < > 4.1  --    < > 4.3   < > 3.6 3.5 3.3* 3.1* 3.8  --  4.0  CL  --    < >  108  --    < > 100   < > 101 101 100 100  --   --  103  CO2  --    < > 22  --   --  23  --  25  --  24 25  --   --  25  GLUCOSE  --    < > 145*  --    < > 153*   < > 122* 139* 113* 116*  --   --  93  BUN  --    < > 11  --    < > 14   < > 28* 35* 39* 43*  --   --  43*  CREATININE 1.15   < > 1.15 1.27*   < > 1.36*   < > 1.80* 1.90* 1.75* 1.42*  --   --  1.33*  CALCIUM  --    < > 7.7*  --   --  8.2*  --  8.0*  --  7.9* 7.9*  --   --  8.2*  MG 2.9*  --  2.8* 2.4  --   --   --   --   --   --   --   --  2.7*  --    < > = values in this interval not displayed.    Liver Function Tests: No results for input(s): AST, ALT, ALKPHOS, BILITOT, PROT, ALBUMIN in the last 168 hours. No results for input(s): LIPASE, AMYLASE in the last 168 hours. No results for input(s): AMMONIA in the last 168 hours.  CBC: Recent Labs  Lab 03/18/18 0442   03/19/18 0434 03/19/18 1715 03/20/18 0434 03/21/18 0540 03/22/18 0448  WBC 26.0*  --  20.9*  --  14.5* 8.3 11.6*  HGB 11.1*   < > 9.9* 9.5* 10.0* 9.8* 10.3*  HCT 33.1*   < > 29.4* 28.0* 29.7* 30.0* 31.1*  MCV 96.2  --  97.0  --  95.5 96.5 96.0  PLT 138*  --  124*  --  169 165 194   < > = values in this interval not displayed.    Cardiac Enzymes: No results for input(s): CKTOTAL, CKMB, CKMBINDEX, TROPONINI in the last 168 hours.  BNP: BNP (last 3 results) No results for input(s): BNP in the last 8760 hours.  ProBNP (last 3 results) No results for input(s): PROBNP in the last 8760 hours.   CBG: Recent Labs  Lab 03/21/18 2319 03/22/18 0135 03/22/18 0330 03/22/18 0800 03/22/18 1102  GLUCAP 73 80 80 85 99    Coagulation Studies: No results for input(s): LABPROT, INR in the last 72 hours.   Imaging   Dg Chest 2 View  Result Date: 03/22/2018 CLINICAL DATA:  CABG, shortness of breath EXAM: CHEST - 2 VIEW COMPARISON:  03/21/2018 FINDINGS: Prior CABG. Small to moderate left pleural effusion with left base atelectasis or infiltrate, not significantly changed. Mild cardiomegaly. Small right effusion. No confluent opacity on the right. IMPRESSION: Small to moderate left effusion and small right effusion. Left base atelectasis or infiltrate. Mild cardiomegaly. No real change. Electronically Signed   By: Rolm Baptise M.D.   On: 03/22/2018 10:15      Medications:     Current Medications: . amiodarone  200 mg Oral BID  . aspirin EC  325 mg Oral Daily   Or  . aspirin  324 mg Per Tube Daily  . bisacodyl  10 mg Oral Daily   Or  .  bisacodyl  10 mg Rectal Daily  . carvedilol  12.5 mg Oral BID WC  . Chlorhexidine Gluconate Cloth  6 each Topical Daily  . docusate sodium  200 mg Oral Daily  . furosemide  40 mg Oral Daily  . guaiFENesin  600 mg Oral BID  . hydrALAZINE  10 mg Oral Q8H  . insulin aspart  0-15 Units Subcutaneous TID WC  . insulin aspart  0-5 Units Subcutaneous  QHS  . insulin aspart  3 Units Subcutaneous TID WC  . insulin detemir  18 Units Subcutaneous BID  . metoCLOPramide (REGLAN) injection  10 mg Intravenous Q12H  . mometasone-formoterol  2 puff Inhalation BID  . moving right along book   Does not apply Once  . pantoprazole  40 mg Oral Daily  . pneumococcal 23 valent vaccine  0.5 mL Intramuscular Tomorrow-1000  . sodium chloride flush  3 mL Intravenous Q12H  . sodium chloride flush  3 mL Intravenous Q12H     Infusions: . sodium chloride    . sodium chloride    . cefTAZidime (FORTAZ)  IV         Patient Profile  Mr Gillson 77 yo with history of CAD, ischemic cardiomyopathy, systolic heart failure.   Admitted for scheduled CABG.    Assessment/Plan  1. S/P CABG x4 on 03/16/2018  Doing well post operatively. Continue to mobilize. Continue incentive spirometer.  On asa, bb.  Add statin.   2. Chronic Systolic Heart Failure, ICM  -Cardiac MRI in 7/19 with EF 28%, significant viability noted.  - TEE on 9/12 showed improved EF 40-45%.  -Volume status mildly elevated. Continue lasix 40 mg twice a day.  - On carvedilol 12.5 mg twice a day  - Add 12.5 mg spiro daily - Add 24-26 mg entresto. SBP have been high.   Moving to 4E today. We will follow with you.    Medication concerns reviewed with patient and pharmacy team. Barriers identified: none   Length of Stay: [place], NP  03/22/2018, 11:46 AM  Advanced Heart Failure Team Pager [phone] (M-F; [place])  Please contact [facility] for night-coverage after hours (4p -7a ) and weekends on [email or web address]  Patient seen and examined with Darrick Grinder, NP. We discussed all aspects of the encounter. I agree with the assessment and plan as stated above.   77 y/o male with CAD, iCM and chronic systolic HF who underwent elective CABG on 9/12. Doing well post-op. Rhythm stable. Off pressors. EF up to 40-45%. On exam still with significant volume overload despite being back to  baseline weight. Will resume IV lasix. Cut back carvedilol to 6.25 bid. Add entresto and spiro. Watch electrolytes. Continue post-CABG care.   Glori Bickers, MD  7:11 PM

## 2018-03-22 NOTE — Progress Notes (Addendum)
Patient walked approximately 115 ft from his room to the end of the hall (to Room 27). Patient returned to room d/t patient SOB. Patient was placed on oxygen 2L at beginning of shift because he felt out of breathe. Sats 95-100% on 2L. Rhonchi heard mid to lower lobes. Diminished upper lobes. Will continue to monitor.

## 2018-03-22 NOTE — Progress Notes (Signed)
6 Days Post-Op Procedure(s) (LRB): CORONARY ARTERY BYPASS GRAFTING (CABG) x 4, LIMA-LAD, SVG-RAMUS, SVG-PD, SVG-DIAG, USING LEFT INTERNAL MAMMARY ARTERY AND RIGHT GREATER SAPHENOUS VEIN HARVESTED ENDOSCOPICALLY (N/A) TRANSESOPHAGEAL ECHOCARDIOGRAM (TEE) (N/A) Subjective: Remains fatigued with rattle cough Walked 200 ft On room air  Objective: Vital signs in last 24 hours: Temp:  [98.2 F (36.8 C)-98.7 F (37.1 C)] 98.2 F (36.8 C) (09/18 0802) Pulse Rate:  [31-96] 55 (09/18 0900) Cardiac Rhythm: Heart block (09/18 0800) Resp:  [14-35] 16 (09/18 0900) BP: (110-175)/(48-156) 169/156 (09/18 0900) SpO2:  [87 %-99 %] 93 % (09/18 0900) Weight:  [92.5 kg] 92.5 kg (09/18 0600)  Hemodynamic parameters for last 24 hours:    Intake/Output from previous day: 09/17 0701 - 09/18 0700 In: 1059.7 [P.O.:720; I.V.:9.3; IV Piggyback:330.4] Out: 500 [Urine:500] Intake/Output this shift: Total I/O In: 100 [IV Piggyback:100] Out: -        Exam    General- alert and comfortable    Neck- no JVD, no cervical adenopathy palpable, no carotid bruit   Lungs- scattered rhonchi   Cor- sinus with PACs, no murmur , gallop   Abdomen- soft, non-tender   Extremities - warm, non-tender, minimal edema   Neuro- oriented, appropriate, no focal weakness   Lab Results: Recent Labs    03/21/18 0540 03/22/18 0448  WBC 8.3 11.6*  HGB 9.8* 10.3*  HCT 30.0* 31.1*  PLT 165 194   BMET:  Recent Labs    03/21/18 0540 03/21/18 1833 03/22/18 0448  NA 136  --  138  K 3.1* 3.8 4.0  CL 100  --  103  CO2 25  --  25  GLUCOSE 116*  --  93  BUN 43*  --  43*  CREATININE 1.42*  --  1.33*  CALCIUM 7.9*  --  8.2*    PT/INR: No results for input(s): LABPROT, INR in the last 72 hours. ABG    Component Value Date/Time   PHART 7.298 (L) 03/16/2018 1848   HCO3 22.2 03/16/2018 1848   TCO2 27 03/19/2018 1715   ACIDBASEDEF 4.0 (H) 03/16/2018 1848   O2SAT 65.6 03/21/2018 0510   CBG (last 3)  Recent Labs   03/22/18 0135 03/22/18 0330 03/22/18 0800  GLUCAP 80 80 85    Assessment/Plan: S/P Procedure(s) (LRB): CORONARY ARTERY BYPASS GRAFTING (CABG) x 4, LIMA-LAD, SVG-RAMUS, SVG-PD, SVG-DIAG, USING LEFT INTERNAL MAMMARY ARTERY AND RIGHT GREATER SAPHENOUS VEIN HARVESTED ENDOSCOPICALLY (N/A) TRANSESOPHAGEAL ECHOCARDIOGRAM (TEE) (N/A) tx stepdown HF to see re Entresto   LOS: 6 days    Tharon Aquas Trigt III 03/22/2018

## 2018-03-22 NOTE — Evaluation (Signed)
Clinical/Bedside Swallow Evaluation Patient Details  Name: Robert [name] MRN: [id number] Date of Birth: 04-Jan-1941  Today's Date: 03/22/2018 Time: SLP Start Time (ACUTE ONLY): 2671 SLP Stop Time (ACUTE ONLY): 0847 SLP Time Calculation (min) (ACUTE ONLY): 14 min  Past Medical History:  Past Medical History:  Diagnosis Date  . Childhood asthma   . Coronary artery disease   . Hay fever   . Hypertension   . Macular hole of left eye 12/23/2011  . Macular hole, right eye 03/05/2014  . Myocardial infarction [facility])    June 2019  . Neuromuscular disorder (HCC)    numbness in feet  . PONV (postoperative nausea and vomiting)   . Presence of external cardiac defibrillator 2019  . 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 Sinus headache    "hay fever season; not that often"  . Transverse myelitis ([place]) 2009   was treated by Dr Erling Cruz  . Umbilical hernia    Past Surgical History:  Past Surgical History:  Procedure Laterality Date  . [place] VITRECTOMY WITH 20 GAUGE MVR PORT FOR MACULAR HOLE Left 03/08/2013   Procedure: 25 GAUGE PARS PLANA VITRECTOMY WITH 20 GAUGE MVR PORT FOR MACULAR HOLE;  Surgeon: Hayden Pedro, MD;  Location: [place];  Service: Ophthalmology;  Laterality: Left;  . 25 GAUGE PARS PLANA VITRECTOMY WITH 20 GAUGE MVR PORT FOR MACULAR HOLE Right 04/02/2014   Procedure: 20-25 GAUGE PARS PLANA VITRECTOMY ; MEMBRANE PEEL; HEADSCOPE LASER; SERUM PATCH; GAS/FLUID EXCHANGE ;C3F8;  Surgeon: Hayden Pedro, MD;  Location: [place];  Service: Ophthalmology;  Laterality: Right;  . CATARACT EXTRACTION W/ INTRAOCULAR LENS  IMPLANT, BILATERAL Bilateral 2014  . CORONARY ARTERY BYPASS GRAFT N/A 03/16/2018   Procedure: CORONARY ARTERY BYPASS GRAFTING (CABG) x 4, LIMA-LAD, SVG-RAMUS, SVG-PD, SVG-DIAG, USING LEFT INTERNAL MAMMARY ARTERY AND RIGHT GREATER SAPHENOUS VEIN HARVESTED ENDOSCOPICALLY;  Surgeon: Ivin Poot, MD;  Location: [place];  Service: Open Heart  Surgery;  Laterality: N/A;  . EYE SURGERY    . GAS INSERTION Left 03/08/2013   Procedure: INSERTION OF GAS;  Surgeon: Hayden Pedro, MD;  Location: [place];  Service: Ophthalmology;  Laterality: Left;  C3F8  . GAS INSERTION Right 05/07/2014   Procedure: INSERTION OF GAS (C3F8);  Surgeon: Hayden Pedro, MD;  Location: [place];  Service: Ophthalmology;  Laterality: Right;  . GAS/FLUID EXCHANGE Right 05/07/2014   Procedure: GAS/FLUID EXCHANGE;  Surgeon: Hayden Pedro, MD;  Location: [place];  Service: Ophthalmology;  Laterality: Right;  . LASER PHOTO ABLATION Right 05/07/2014   Procedure: LASER PHOTO ABLATION;  Surgeon: Hayden Pedro, MD;  Location: [place];  Service: Ophthalmology;  Laterality: Right;  . PARS PLANA VITRECTOMY  01/18/2012   Procedure: PARS PLANA VITRECTOMY WITH 25 GAUGE;  Surgeon: Hayden Pedro, MD;  Location: [place];  Service: Ophthalmology;  Laterality: Left;  25g. ppv for macular hole ; Laser treatment;Serum patch and Gas Injection(C3F8)  . PARS PLANA VITRECTOMY Right 05/07/2014   Procedure: PARS PLANA VITRECTOMY WITH 25 GAUGE;  Surgeon: Hayden Pedro, MD;  Location: [place];  Service: Ophthalmology;  Laterality: Right;  . PARS PLANA VITRECTOMY W/ REPAIR OF MACULAR HOLE Right 04/02/2014  . PHOTOCOAGULATION WITH LASER Left 03/08/2013   Procedure: PHOTOCOAGULATION WITH LASER;  Surgeon: Hayden Pedro, MD;  Location: [place];  Service: Ophthalmology;  Laterality: Left;  Headscope   . REPAIR OF COMPLEX TRACTION RETINAL DETACHMENT Right 05/07/2014   Procedure: REPAIR OF COMPLEX  TRACTION RETINAL DETACHMENT;  Surgeon: Hayden Pedro, MD;  Location: [place];  Service: Ophthalmology;  Laterality: Right;  . RIGHT HEART CATH N/A 01/19/2018   Procedure: RIGHT HEART CATH;  Surgeon: Larey Dresser, MD;  Location: [place] [facility];  Service: Cardiovascular;  Laterality: N/A;  . SCLERAL BUCKLE Right 05/07/2014   Procedure: SCLERAL BUCKLE;  Surgeon: Hayden Pedro, MD;  Location: [place];  Service:  Ophthalmology;  Laterality: Right;  . TEE WITHOUT CARDIOVERSION N/A 03/16/2018   Procedure: TRANSESOPHAGEAL ECHOCARDIOGRAM (TEE);  Surgeon: Prescott Gum, Collier Salina, MD;  Location: [place];  Service: Open Heart Surgery;  Laterality: N/A;   HPI:  Pt is a 77 y.o. male with hx of HTN, CAD, myocardial infarction, diabetes, pt has external cardiac defibrillator and is a smoker (2 packs/day). Per chart, he underwent CABG 9/12, intubated and extubated same day. CXR revealed small bilateral pleural effusions, left greater than right, with associated subsegmental atelectasis.   Assessment / Plan / Recommendation Clinical Impression  Pt found alert and sitting upright in chair upon entrance to his room. He reported general fatigue, persistent SOB, decreased appetite, and occasional difficulty consuming dual consistency PO's (large pills with water), but no pain/discomfort while swallowing or hx of dysphagia. Pt presented with facial symmetry, lingual and labial strength and ROM WFL, and suspect adequate hyolargyngeal elevation given palpation of volitional swallow. Due to SOB and anticipated discomfort, pt refused to elicit volitional cough. During 3oz water test, pt was unable to finish via consecutive sips due to decreased respiratory status, however no overt s/s aspiration were observed with thin and regular PO's. SLP educated pt regarding aspiration precautions and advised to self-feed, taking slow, small bites and sips suring meals. Recommend continue regular diet, thin liquids, meds can be swallowed whole with thin or in puree depending on pt preference. No further ST is indicated at this time.    SLP Visit Diagnosis: Dysphagia, unspecified (R13.10)    Aspiration Risk  Mild aspiration risk    Diet Recommendation Regular;Thin liquid   Liquid Administration via: Cup;Straw Medication Administration: Whole meds with liquid Supervision: Patient able to self feed Compensations: Slow rate;Small sips/bites;Minimize  environmental distractions Postural Changes: Seated upright at 90 degrees    Other  Recommendations Oral Care Recommendations: Oral care BID   Follow up Recommendations None      Frequency and Duration            Prognosis        Swallow Study   General HPI: Pt is a 77 y.o. male with hx of HTN, CAD, myocardial infarction, diabetes, pt has external cardiac defibrillator and is a smoker (2 packs/day). Per chart, he underwent CABG 9/12, intubated and extubated same day. CXR revealed small bilateral pleural effusions, left greater than right, with associated subsegmental atelectasis. Type of Study: Bedside Swallow Evaluation Diet Prior to this Study: Regular;Thin liquids Temperature Spikes Noted: No Respiratory Status: Room air History of Recent Intubation: Yes Length of Intubations (days): 1 days Date extubated: 03/16/18 Behavior/Cognition: Alert;Pleasant mood;Cooperative;Requires cueing Oral Cavity Assessment: Within Functional Limits Oral Care Completed by SLP: No Oral Cavity - Dentition: Adequate natural dentition Vision: Functional for self-feeding Self-Feeding Abilities: Able to feed self Patient Positioning: Upright in chair Baseline Vocal Quality: Normal Volitional Cough: Other (Comment)(refused to elicit due to SOB) Volitional Swallow: Able to elicit    Oral/Motor/Sensory Function Overall Oral Motor/Sensory Function: Within functional limits   Ice Chips Ice chips: Not tested   Thin Liquid Thin Liquid: Within functional  limits Presentation: Cup;Straw    Nectar Thick Nectar Thick Liquid: Not tested   Honey Thick Honey Thick Liquid: Not tested   Puree Puree: Not tested   Solid    Jettie Booze, Student SLP  Solid: Within functional limits Presentation: [place] 03/22/2018,9:12 AM

## 2018-03-23 DIAGNOSIS — I5043 Acute on chronic combined systolic (congestive) and diastolic (congestive) heart failure: Secondary | ICD-10-CM

## 2018-03-23 LAB — BASIC METABOLIC PANEL
Anion gap: 12 (ref 5–15)
BUN: 36 mg/dL — ABNORMAL HIGH (ref 8–23)
CO2: 30 mmol/L (ref 22–32)
Calcium: 8.2 mg/dL — ABNORMAL LOW (ref 8.9–10.3)
Chloride: 96 mmol/L — ABNORMAL LOW (ref 98–111)
Creatinine, Ser: 1.37 mg/dL — ABNORMAL HIGH (ref 0.61–1.24)
GFR calc Af Amer: 57 mL/min — ABNORMAL LOW (ref >60)
GFR calc non Af Amer: 49 mL/min — ABNORMAL LOW (ref >60)
Glucose, Bld: 108 mg/dL — ABNORMAL HIGH (ref 70–99)
Potassium: 3.3 mmol/L — ABNORMAL LOW (ref 3.5–5.1)
Sodium: 138 mmol/L (ref 135–145)

## 2018-03-23 LAB — GLUCOSE, CAPILLARY
Glucose-Capillary: 120 mg/dL — ABNORMAL HIGH (ref 70–99)
Glucose-Capillary: 146 mg/dL — ABNORMAL HIGH (ref 70–99)
Glucose-Capillary: 124 mg/dL — ABNORMAL HIGH (ref 70–99)
Glucose-Capillary: 106 mg/dL — ABNORMAL HIGH (ref 70–99)

## 2018-03-23 MED ORDER — POTASSIUM CHLORIDE CRYS ER 20 MEQ PO TBCR
40.0000 meq | EXTENDED_RELEASE_TABLET | Freq: Once | ORAL | Status: AC
  Administered 2018-03-23: 40 meq via ORAL
  Filled 2018-03-23: qty 2

## 2018-03-23 MED ORDER — SACUBITRIL-VALSARTAN 49-51 MG PO TABS
1.0000 | ORAL_TABLET | Freq: Two times a day (BID) | ORAL | Status: DC
  Administered 2018-03-23 – 2018-03-25 (×4): 1 via ORAL
  Filled 2018-03-23 (×5): qty 1

## 2018-03-23 MED ORDER — FUROSEMIDE 10 MG/ML IJ SOLN
60.0000 mg | Freq: Two times a day (BID) | INTRAMUSCULAR | Status: DC
  Administered 2018-03-23 – 2018-03-24 (×2): 60 mg via INTRAVENOUS
  Filled 2018-03-23 (×2): qty 6

## 2018-03-23 MED ORDER — SPIRONOLACTONE 25 MG PO TABS
25.0000 mg | ORAL_TABLET | Freq: Every day | ORAL | Status: DC
  Administered 2018-03-24 – 2018-03-25 (×2): 25 mg via ORAL
  Filled 2018-03-23 (×2): qty 1

## 2018-03-23 NOTE — Progress Notes (Signed)
CARDIAC REHAB PHASE I   PRE:  Rate/Rhythm: 80      1st deg with PVCs  BP:  Sitting: 141/64      SaO2: 97 RA  MODE:  Ambulation: 270 ft   POST:  Rate/Rhythm: 75      1st deg with PVCs  BP:  Sitting: 126/75    SaO2: 94 RA   Pt ambulated 221ft in hallway assist of 1 with front wheel walker, pt took 3 standing rest breaks for SOB. Pt maintained sats >94 for the entire walk. Pt guided through purse lipped breathing. Pt denies CP. Pt encouraged to walk once more for a total of 3 walks today. Will continue to follow.  [id number] Rufina Falco, RN BSN 03/23/2018 11:17 AM

## 2018-03-23 NOTE — Progress Notes (Signed)
[address] CitySuite [address]       [place] [address]             [phone]      7 Days Post-Op Procedure(s) (LRB): CORONARY ARTERY BYPASS GRAFTING (CABG) x 4, LIMA-LAD, SVG-RAMUS, SVG-PD, SVG-DIAG, USING LEFT INTERNAL MAMMARY ARTERY AND RIGHT GREATER SAPHENOUS VEIN HARVESTED ENDOSCOPICALLY (N/A) TRANSESOPHAGEAL ECHOCARDIOGRAM (TEE) (N/A) Subjective: Restless, can't get comfortable, didn't sleep well Not SOB, some cough  Objective: Vital signs in last 24 hours: Temp:  [98 F (36.7 C)-98.5 F (36.9 C)] 98.1 F (36.7 C) (09/19 0409) Pulse Rate:  [55-84] 84 (09/19 0511) Cardiac Rhythm: Sinus tachycardia (09/19 0700) Resp:  [16-30] 30 (09/19 0511) BP: (113-169)/(57-156) 124/65 (09/19 0409) SpO2:  [88 %-95 %] 95 % (09/19 0511) Weight:  [89.5 kg] 89.5 kg (09/19 0511)  Hemodynamic parameters for last 24 hours:    Intake/Output from previous day: 09/18 0701 - 09/19 0700 In: 300.1 [IV Piggyback:300.1] Out: 1450 [Urine:1450] Intake/Output this shift: No intake/output data recorded.  General appearance: alert, cooperative and no distress Heart: regular rate and rhythm and freq extrasystoles Lungs: coarse, dim in lowerfields Abdomen: non tender Extremities: + LE edema Wound: incis healing well  Lab Results: Recent Labs    03/21/18 0540 03/22/18 0448  WBC 8.3 11.6*  HGB 9.8* 10.3*  HCT 30.0* 31.1*  PLT 165 194   BMET:  Recent Labs    03/22/18 0448 03/23/18 0327  NA 138 138  K 4.0 3.3*  CL 103 96*  CO2 25 30  GLUCOSE 93 108*  BUN 43* 36*  CREATININE 1.33* 1.37*  CALCIUM 8.2* 8.2*    PT/INR: No results for input(s): LABPROT, INR in the last 72 hours. ABG    Component Value Date/Time   PHART 7.298 (L) 03/16/2018 1848   HCO3 22.2 03/16/2018 1848   TCO2 27 03/19/2018 1715   ACIDBASEDEF 4.0 (H) 03/16/2018 1848   O2SAT 65.6 03/21/2018 0510   CBG (last 3)  Recent Labs    03/22/18 1643 03/22/18 2059 03/23/18 0626  GLUCAP 98 107* 120*     Meds Scheduled Meds: . amiodarone  200 mg Oral BID  . aspirin EC  325 mg Oral Daily   Or  . aspirin  324 mg Per Tube Daily  . atorvastatin  40 mg Oral q1800  . bisacodyl  10 mg Oral Daily   Or  . bisacodyl  10 mg Rectal Daily  . carvedilol  6.25 mg Oral BID WC  . Chlorhexidine Gluconate Cloth  6 each Topical Daily  . docusate sodium  200 mg Oral Daily  . furosemide  40 mg Intravenous BID  . guaiFENesin  600 mg Oral BID  . hydrALAZINE  10 mg Oral Q8H  . insulin aspart  0-15 Units Subcutaneous TID WC  . insulin aspart  0-5 Units Subcutaneous QHS  . insulin aspart  3 Units Subcutaneous TID WC  . insulin detemir  18 Units Subcutaneous BID  . metoCLOPramide (REGLAN) injection  10 mg Intravenous Q12H  . mometasone-formoterol  2 puff Inhalation BID  . moving right along book   Does not apply Once  . pantoprazole  40 mg Oral Daily  . pneumococcal 23 valent vaccine  0.5 mL Intramuscular Tomorrow-1000  . sacubitril-valsartan  1 tablet Oral BID  . sodium chloride flush  3 mL Intravenous Q12H  . sodium chloride flush  3 mL Intravenous Q12H  . spironolactone  12.5 mg Oral Daily   Continuous Infusions: .  sodium chloride    . sodium chloride    . cefTAZidime (FORTAZ)  IV 1 g (03/23/18 0600)   PRN Meds:.sodium chloride, hydroxypropyl methylcellulose / hypromellose, levalbuterol, magnesium hydroxide, metoprolol tartrate, ondansetron (ZOFRAN) IV, oxyCODONE, potassium chloride, sodium chloride flush, sodium chloride flush, traMADol  Xrays Dg Chest 2 View  Result Date: 03/22/2018 CLINICAL DATA:  CABG, shortness of breath EXAM: CHEST - 2 VIEW COMPARISON:  03/21/2018 FINDINGS: Prior CABG. Small to moderate left pleural effusion with left base atelectasis or infiltrate, not significantly changed. Mild cardiomegaly. Small right effusion. No confluent opacity on the right. IMPRESSION: Small to moderate left effusion and small right effusion. Left base atelectasis or infiltrate. Mild  cardiomegaly. No real change. Electronically Signed   By: Rolm Baptise M.D.   On: 03/22/2018 10:15    Assessment/Plan: S/P Procedure(s) (LRB): CORONARY ARTERY BYPASS GRAFTING (CABG) x 4, LIMA-LAD, SVG-RAMUS, SVG-PD, SVG-DIAG, USING LEFT INTERNAL MAMMARY ARTERY AND RIGHT GREATER SAPHENOUS VEIN HARVESTED ENDOSCOPICALLY (N/A) TRANSESOPHAGEAL ECHOCARDIOGRAM (TEE) (N/A)  1 progressing well, AHF team assisting with management of acute on chronic CHF, ICM 2 replace K+ 3 sugars adeq controlled 4 cont pulm toilet and rehab    LOS: 7 days    Robert [name] 03/23/2018 Pager [phone]

## 2018-03-23 NOTE — Progress Notes (Addendum)
Advanced Heart Failure Rounding Note  PCP-Cardiologist: No primary care provider on file.   Subjective:    Yesterday he was diuresed with IV lasix. Negative 1.1 liters.   Walked down the hall with cardiac rehab. Had to take a few breaks due to shortness of breath.   Denies CP. SOB with exertion. Complaining of cough.   Objective:   Weight Range: 89.5 kg Body mass index is 29.14 kg/m.   Vital Signs:   Temp:  [98 F (36.7 C)-98.5 F (36.9 C)] 98.1 F (36.7 C) (09/19 0409) Pulse Rate:  [58-84] 75 (09/19 0902) Resp:  [18-30] 30 (09/19 0511) BP: (113-143)/(62-94) 141/94 (09/19 0902) SpO2:  [91 %-97 %] 97 % (09/19 0827) Weight:  [89.5 kg] 89.5 kg (09/19 0511) Last BM Date: 03/22/18  Weight change: Filed Weights   03/21/18 0500 03/22/18 0600 03/23/18 0511  Weight: 93.6 kg 92.5 kg 89.5 kg    Intake/Output:   Intake/Output Summary (Last 24 hours) at 03/23/2018 1101 Last data filed at 03/23/2018 1041 Gross per 24 hour  Intake 203.06 ml  Output 2100 ml  Net -1896.94 ml      Physical Exam    General:   No resp difficulty. Walking in the hall.  HEENT: Normal Neck: Supple. JVP 9-10 . Carotids 2+ bilat; no bruits. No lymphadenopathy or thyromegaly appreciated. Cor: PMI nondisplaced. Regular rate & rhythm. No rubs, gallops or murmurs. Sternal incision approximated.  Lungs: Decreased on the left.  Abdomen: Soft, nontender, nondistended. No hepatosplenomegaly. No bruits or masses. Good bowel sounds. Extremities: No cyanosis, clubbing, rash, R and LLE edema Neuro: Alert & orientedx3, cranial nerves grossly intact. moves all 4 extremities w/o difficulty. Affect pleasant   Telemetry   NSR 70-80s personally reviewed.   EKG    N/A   Labs    CBC Recent Labs    03/21/18 0540 03/22/18 0448  WBC 8.3 11.6*  HGB 9.8* 10.3*  HCT 30.0* 31.1*  MCV 96.5 96.0  PLT 165 967   Basic Metabolic Panel Recent Labs    03/21/18 1959 03/22/18 0448 03/23/18 0327  NA  --   138 138  K  --  4.0 3.3*  CL  --  103 96*  CO2  --  25 30  GLUCOSE  --  93 108*  BUN  --  43* 36*  CREATININE  --  1.33* 1.37*  CALCIUM  --  8.2* 8.2*  MG 2.7*  --   --    Liver Function Tests No results for input(s): AST, ALT, ALKPHOS, BILITOT, PROT, ALBUMIN in the last 72 hours. No results for input(s): LIPASE, AMYLASE in the last 72 hours. Cardiac Enzymes No results for input(s): CKTOTAL, CKMB, CKMBINDEX, TROPONINI in the last 72 hours.  BNP: BNP (last 3 results) No results for input(s): BNP in the last 8760 hours.  ProBNP (last 3 results) No results for input(s): PROBNP in the last 8760 hours.   D-Dimer No results for input(s): DDIMER in the last 72 hours. Hemoglobin A1C No results for input(s): HGBA1C in the last 72 hours. Fasting Lipid Panel No results for input(s): CHOL, HDL, LDLCALC, TRIG, CHOLHDL, LDLDIRECT in the last 72 hours. Thyroid Function Tests No results for input(s): TSH, T4TOTAL, T3FREE, THYROIDAB in the last 72 hours.  Invalid input(s): FREET3  Other results:   Imaging     No results found.   Medications:     Scheduled Medications: . amiodarone  200 mg Oral BID  . aspirin EC  325  mg Oral Daily   Or  . aspirin  324 mg Per Tube Daily  . atorvastatin  40 mg Oral q1800  . bisacodyl  10 mg Oral Daily   Or  . bisacodyl  10 mg Rectal Daily  . carvedilol  6.25 mg Oral BID WC  . Chlorhexidine Gluconate Cloth  6 each Topical Daily  . docusate sodium  200 mg Oral Daily  . furosemide  40 mg Intravenous BID  . guaiFENesin  600 mg Oral BID  . hydrALAZINE  10 mg Oral Q8H  . insulin aspart  0-15 Units Subcutaneous TID WC  . insulin aspart  0-5 Units Subcutaneous QHS  . insulin aspart  3 Units Subcutaneous TID WC  . insulin detemir  18 Units Subcutaneous BID  . metoCLOPramide (REGLAN) injection  10 mg Intravenous Q12H  . mometasone-formoterol  2 puff Inhalation BID  . moving right along book   Does not apply Once  . pantoprazole  40 mg Oral  Daily  . pneumococcal 23 valent vaccine  0.5 mL Intramuscular Tomorrow-1000  . sacubitril-valsartan  1 tablet Oral BID  . sodium chloride flush  3 mL Intravenous Q12H  . sodium chloride flush  3 mL Intravenous Q12H  . spironolactone  12.5 mg Oral Daily     Infusions: . sodium chloride    . sodium chloride    . cefTAZidime (FORTAZ)  IV 1 g (03/23/18 0600)     PRN Medications:  sodium chloride, hydroxypropyl methylcellulose / hypromellose, levalbuterol, magnesium hydroxide, metoprolol tartrate, ondansetron (ZOFRAN) IV, oxyCODONE, sodium chloride flush, sodium chloride flush, traMADol    Patient Profile   Robert [name] 77 yo with history of CAD, ischemic cardiomyopathy, systolic heart failure.   Admitted for scheduled CABG.    Assessment/Plan  1. S/P CABG x4 on 03/16/2018  Doing well post operatively. Continue to mobilize. Continue incentive spirometer.  On asa, bb.  Continue statin.   2. Chronic Systolic Heart Failure, ICM  -Cardiac MRI in 7/19 with EF 28%, significant viability noted.  - TEE on 9/12 showed improved EF 40-45%.  -Volume status much improved but needs to continue IV lasix today. Continue lasix 40 mg twice a day.  - On carvedilol 6.25 mg twice a day  - Increase spiro 25 mg daily - Increase entresto 49-51 mg twice a day.    Medication concerns reviewed with patient and pharmacy team. Barriers identified: none   Length of Stay: 7  Amy Clegg, NP  03/23/2018, 11:01 AM  Advanced Heart Failure Team Pager [phone] (M-F; [place])  Please contact [facility] for night-coverage after hours (4p -7a ) and weekends on [email or web address]  Patient seen with NP, agree with the above note.  He remains volume overloaded on exam.  Diuresing with IV Lasix.   - Agree with increase Entresto to 49/51 bid and spironolactone to 25 daily.   - Can increase IV Lasix to 60 mg IV bid.  Needs a day or 2 longer of diuresis.  - Discontinue hydralazine.   [name] 03/23/2018 1:40 PM

## 2018-03-24 ENCOUNTER — Inpatient Hospital Stay (HOSPITAL_COMMUNITY): Payer: Medicare Other

## 2018-03-24 LAB — CBC
HCT: 32.5 % — ABNORMAL LOW (ref 39.0–52.0)
Hemoglobin: 10.9 g/dL — ABNORMAL LOW (ref 13.0–17.0)
MCH: 32.2 pg (ref 26.0–34.0)
MCHC: 33.5 g/dL (ref 30.0–36.0)
MCV: 96.2 fL (ref 78.0–100.0)
Platelets: 271 10*3/uL (ref 150–400)
RBC: 3.38 MIL/uL — ABNORMAL LOW (ref 4.22–5.81)
RDW: 14.2 % (ref 11.5–15.5)
WBC: 14.1 10*3/uL — ABNORMAL HIGH (ref 4.0–10.5)

## 2018-03-24 LAB — BASIC METABOLIC PANEL
Anion gap: 13 (ref 5–15)
BUN: 33 mg/dL — ABNORMAL HIGH (ref 8–23)
CO2: 30 mmol/L (ref 22–32)
Calcium: 8.3 mg/dL — ABNORMAL LOW (ref 8.9–10.3)
Chloride: 97 mmol/L — ABNORMAL LOW (ref 98–111)
Creatinine, Ser: 1.41 mg/dL — ABNORMAL HIGH (ref 0.61–1.24)
GFR calc Af Amer: 55 mL/min — ABNORMAL LOW (ref >60)
GFR calc non Af Amer: 48 mL/min — ABNORMAL LOW (ref >60)
Glucose, Bld: 89 mg/dL (ref 70–99)
Potassium: 3.3 mmol/L — ABNORMAL LOW (ref 3.5–5.1)
Sodium: 140 mmol/L (ref 135–145)

## 2018-03-24 LAB — GLUCOSE, CAPILLARY
Glucose-Capillary: 81 mg/dL (ref 70–99)
Glucose-Capillary: 118 mg/dL — ABNORMAL HIGH (ref 70–99)
Glucose-Capillary: 104 mg/dL — ABNORMAL HIGH (ref 70–99)
Glucose-Capillary: 110 mg/dL — ABNORMAL HIGH (ref 70–99)

## 2018-03-24 MED ORDER — FUROSEMIDE 20 MG PO TABS: 20.0000 mg | ORAL_TABLET | Freq: Every evening | ORAL | Status: DC

## 2018-03-24 MED ORDER — FUROSEMIDE 10 MG/ML IJ SOLN
40.0000 mg | Freq: Once | INTRAMUSCULAR | Status: AC
  Administered 2018-03-24: 40 mg via INTRAVENOUS
  Filled 2018-03-24: qty 4

## 2018-03-24 MED ORDER — FUROSEMIDE 40 MG PO TABS
40.0000 mg | ORAL_TABLET | Freq: Every day | ORAL | Status: DC
  Administered 2018-03-25: 40 mg via ORAL
  Filled 2018-03-24: qty 1

## 2018-03-24 MED ORDER — LIVING WELL WITH DIABETES BOOK
Freq: Once | Status: AC
  Administered 2018-03-24: 21:00:00
  Filled 2018-03-24 (×2): qty 1

## 2018-03-24 MED ORDER — POTASSIUM CHLORIDE CRYS ER 20 MEQ PO TBCR
40.0000 meq | EXTENDED_RELEASE_TABLET | Freq: Once | ORAL | Status: AC
  Administered 2018-03-24: 40 meq via ORAL
  Filled 2018-03-24: qty 2

## 2018-03-24 NOTE — Progress Notes (Addendum)
Robert Heart Failure Rounding Note  PCP-Cardiologist: Robert [name].   Subjective:    Negative 427 cc. Down another 2 lbs.   Feeling OK today. Denies SOB. Robert lightheadedness or dizziness. Walking the halls.   Cr 1.37 -> 1.41. K 3.3.  Objective:   Weight Range: 88.8 kg Body mass index is 28.91 kg/m.   Vital Signs:   Temp:  [97.5 F (36.4 C)-98 F (36.7 C)] 97.5 F (36.4 C) (09/20 0409) Pulse Rate:  [60-76] 70 (09/20 0409) Resp:  [16-19] 16 (09/20 0409) BP: (100-141)/(52-94) 129/66 (09/20 0409) SpO2:  [95 %-98 %] 95 % (09/20 0409) Weight:  [88.8 kg] 88.8 kg (09/20 0409) Last BM Date: 03/22/18  Weight change: Filed Weights   03/22/18 0600 03/23/18 0511 03/24/18 0409  Weight: 92.5 kg 89.5 kg 88.8 kg    Intake/Output:   Intake/Output Summary (Last 24 hours) at 03/24/2018 0842 Last data filed at 03/24/2018 0552 Gross per 24 hour  Intake 223 ml  Output 650 ml  Net -427 ml      Physical Exam    General: Robert resp difficulty. HEENT: Normal Neck: Supple. JVP ~8-9 cm. Carotids 2+ bilat; Robert bruits. Robert thyromegaly or nodule noted. Cor: PMI nondisplaced. RRR, Robert M/G/R noted. Sternal incision C/D/I.  Lungs: CTAB, normal effort. Abdomen: Soft, non-tender, non-distended, Robert HSM. Robert bruits or masses. +BS  Extremities: Robert cyanosis, clubbing, or rash. R and LLE Robert edema.  Neuro: Alert & orientedx3, cranial nerves grossly intact. moves all 4 extremities w/o difficulty. Affect pleasant   Telemetry   NSR 70-80s, personally reviewed.   EKG    Robert new tracings.    Labs    CBC Recent Labs    03/22/18 0448 03/24/18 0436  WBC 11.6* 14.1*  HGB 10.3* 10.9*  HCT 31.1* 32.5*  MCV 96.0 96.2  PLT 194 786   Basic Metabolic Panel Recent Labs    03/21/18 1959  03/23/18 0327 03/24/18 0436  NA  --    < > 138 140  K  --    < > 3.3* 3.3*  CL  --    < > 96* 97*  CO2  --    < > 30 30  GLUCOSE  --    < > 108* 89  BUN  --    < > 36* 33*    CREATININE  --    < > 1.37* 1.41*  CALCIUM  --    < > 8.2* 8.3*  MG 2.7*  --   --   --    < > = values in this interval not displayed.   Liver Function Tests Robert results for input(s): AST, ALT, ALKPHOS, BILITOT, PROT, ALBUMIN in the last 72 hours. Robert results for input(s): LIPASE, AMYLASE in the last 72 hours. Cardiac Enzymes Robert results for input(s): CKTOTAL, CKMB, CKMBINDEX, TROPONINI in the last 72 hours.  BNP: BNP (last 3 results) Robert results for input(s): BNP in the last 8760 hours.  ProBNP (last 3 results) Robert results for input(s): PROBNP in the last 8760 hours.   D-Dimer Robert results for input(s): DDIMER in the last 72 hours. Hemoglobin A1C Robert results for input(s): HGBA1C in the last 72 hours. Fasting Lipid Panel Robert results for input(s): CHOL, HDL, LDLCALC, TRIG, CHOLHDL, LDLDIRECT in the last 72 hours. Thyroid Function Tests Robert results for input(s): TSH, T4TOTAL, T3FREE, THYROIDAB in the last 72 hours.  Invalid input(s): FREET3  Other results:   Imaging  Robert results found.   Medications:     Scheduled Medications: . amiodarone  200 mg Oral BID  . aspirin EC  325 mg Oral Daily   Or  . aspirin  324 mg Per Tube Daily  . atorvastatin  40 mg Oral q1800  . bisacodyl  10 mg Oral Daily   Or  . bisacodyl  10 mg Rectal Daily  . carvedilol  6.25 mg Oral BID WC  . Chlorhexidine Gluconate Cloth  6 each Topical Daily  . docusate sodium  200 mg Oral Daily  . furosemide  60 mg Intravenous BID  . guaiFENesin  600 mg Oral BID  . insulin aspart  0-15 Units Subcutaneous TID WC  . insulin aspart  0-5 Units Subcutaneous QHS  . insulin aspart  3 Units Subcutaneous TID WC  . metoCLOPramide (REGLAN) injection  10 mg Intravenous Q12H  . mometasone-formoterol  2 puff Inhalation BID  . moving right along book   Does not apply Once  . pantoprazole  40 mg Oral Daily  . pneumococcal 23 valent vaccine  0.5 mL Intramuscular Tomorrow-1000  . potassium chloride  40 mEq Oral Once  .  sacubitril-valsartan  1 tablet Oral BID  . sodium chloride flush  3 mL Intravenous Q12H  . sodium chloride flush  3 mL Intravenous Q12H  . spironolactone  25 mg Oral Daily    Infusions: . sodium chloride    . sodium chloride    . cefTAZidime (FORTAZ)  IV Stopped (03/24/18 0552)    PRN Medications: sodium chloride, hydroxypropyl methylcellulose / hypromellose, levalbuterol, magnesium hydroxide, metoprolol tartrate, ondansetron (ZOFRAN) IV, oxyCODONE, sodium chloride flush, sodium chloride flush, traMADol    Patient Profile   Robert [name] 77 yo with history of CAD, ischemic cardiomyopathy, systolic heart failure.   Admitted for scheduled CABG.    Assessment/Plan   1. S/P CABG x4 on 03/16/2018  - Doing well post operatively. Continue to mobilize. Continue incentive spirometer.  - Continue asa, statin, and BB.   2. Chronic Systolic Heart Failure, ICM  -Cardiac MRI in 7/19 with EF 28%, significant viability noted.  - TEE on 9/12 showed improved EF 40-45%.  - Volume status remains elevated.   - Continue lasix 60 mg IV at least this am, likely this evening as well.  - Continue carvedilol 6.25 mg BID - Continue spiro 25 mg daily - Continue entresto 49-51 mg BID. BP 100-110s. WIll not increase today.   3. Hypokalemia - K 3.3 - Will supp and follow.   Medication concerns reviewed with patient and pharmacy team. Barriers identified: none   Length of Stay: [address]  Shirley [name], [name]  03/24/2018, 8:42 AM  Robert [name] (M-F; 7a - 4p)  Please contact Robert [name] for night-coverage after hours (4p -7a ) and weekends on Robert [name]  Patient seen with PA, agree with the above note.  Volume improving, creatinine mildly higher, would give Lasix twice IV today then back to his home dose of 40 qam/20 qpm tomorrow.  Continue current Coreg/spironolactone/Entresto.    Will arrange CHF clinic followup. Likely home over weekend.   Robert [name] 03/24/2018 4:26 PM

## 2018-03-24 NOTE — Progress Notes (Signed)
Pt walked x2 w/ wife in halls this pm. RA, rolling walker. About 470 ft each time Tolerated well. Assisted back to bed.

## 2018-03-24 NOTE — Progress Notes (Signed)
Spoke with patient and wife about new onset diabetes.  HgbA1C is 7.1%. Never been told that blood sugars were elevated. Reviewed information on Type 2 diabetes. Ordered Living Well with Diabetes booklet for patient. Will have more education in cardiac rehab.   Harvel Ricks RN BSN CDE Diabetes Coordinator Pager: [phone]  8am-5pm

## 2018-03-24 NOTE — Progress Notes (Signed)
CARDIAC REHAB PHASE I   PRE:  Rate/Rhythm: 27 SR with bigeminy intermittent    BP: sitting 124/62    SaO2: 94 RA  MODE:  Ambulation: 390 ft   POST:  Rate/Rhythm: 81 SR with PVCs    BP: sitting 118/73     SaO2: 94 RA  Moving well in room. Used RW for hall, steady but SOB. Rest x3 for increased distance. To BR after walk then recliner. Gave pt HF booklet to begin reviewing. Will ed with wife tomorrow. [place], ACSM 03/24/2018 11:51 AM

## 2018-03-24 NOTE — Progress Notes (Signed)
[address]       [place] [address]             [phone]      8 Days Post-Op Procedure(s) (LRB): CORONARY ARTERY BYPASS GRAFTING (CABG) x 4, LIMA-LAD, SVG-RAMUS, SVG-PD, SVG-DIAG, USING LEFT INTERNAL MAMMARY ARTERY AND RIGHT GREATER SAPHENOUS VEIN HARVESTED ENDOSCOPICALLY (N/A) TRANSESOPHAGEAL ECHOCARDIOGRAM (TEE) (N/A) Subjective: conts to feel better  Objective: Vital signs in last 24 hours: Temp:  [97.5 F (36.4 C)-98 F (36.7 C)] 97.5 F (36.4 C) (09/20 0409) Pulse Rate:  [60-76] 70 (09/20 0409) Cardiac Rhythm: Normal sinus rhythm (09/19 1900) Resp:  [16-19] 16 (09/20 0409) BP: (100-141)/(52-94) 129/66 (09/20 0409) SpO2:  [95 %-98 %] 95 % (09/20 0409) Weight:  [88.8 kg] 88.8 kg (09/20 0409)  Hemodynamic parameters for last 24 hours:    Intake/Output from previous day: 09/19 0701 - 09/20 0700 In: 223 [I.V.:3; IV Piggyback:200] Out: 650 [Urine:650] Intake/Output this shift: No intake/output data recorded.  General appearance: alert, cooperative and no distress Heart: regular rate and rhythm and occ extrasystole Lungs: coarse in upper airways Abdomen: benign Extremities: + edema Wound: incis healing well  Lab Results: Recent Labs    03/22/18 0448 03/24/18 0436  WBC 11.6* 14.1*  HGB 10.3* 10.9*  HCT 31.1* 32.5*  PLT 194 271   BMET:  Recent Labs    03/23/18 0327 03/24/18 0436  NA 138 140  K 3.3* 3.3*  CL 96* 97*  CO2 30 30  GLUCOSE 108* 89  BUN 36* 33*  CREATININE 1.37* 1.41*  CALCIUM 8.2* 8.3*    PT/INR: No results for input(s): LABPROT, INR in the last 72 hours. ABG    Component Value Date/Time   PHART 7.298 (L) 03/16/2018 1848   HCO3 22.2 03/16/2018 1848   TCO2 27 03/19/2018 1715   ACIDBASEDEF 4.0 (H) 03/16/2018 1848   O2SAT 65.6 03/21/2018 0510   CBG (last 3)  Recent Labs    03/23/18 1726 03/23/18 2059 03/24/18 0625  GLUCAP 124* 106* 81    Meds Scheduled Meds: . amiodarone  200 mg Oral BID  .  aspirin EC  325 mg Oral Daily   Or  . aspirin  324 mg Per Tube Daily  . atorvastatin  40 mg Oral q1800  . bisacodyl  10 mg Oral Daily   Or  . bisacodyl  10 mg Rectal Daily  . carvedilol  6.25 mg Oral BID WC  . Chlorhexidine Gluconate Cloth  6 each Topical Daily  . docusate sodium  200 mg Oral Daily  . furosemide  60 mg Intravenous BID  . guaiFENesin  600 mg Oral BID  . insulin aspart  0-15 Units Subcutaneous TID WC  . insulin aspart  0-5 Units Subcutaneous QHS  . insulin aspart  3 Units Subcutaneous TID WC  . insulin detemir  18 Units Subcutaneous BID  . metoCLOPramide (REGLAN) injection  10 mg Intravenous Q12H  . mometasone-formoterol  2 puff Inhalation BID  . moving right along book   Does not apply Once  . pantoprazole  40 mg Oral Daily  . pneumococcal 23 valent vaccine  0.5 mL Intramuscular Tomorrow-1000  . sacubitril-valsartan  1 tablet Oral BID  . sodium chloride flush  3 mL Intravenous Q12H  . sodium chloride flush  3 mL Intravenous Q12H  . spironolactone  25 mg Oral Daily   Continuous Infusions: . sodium chloride    . sodium chloride    . cefTAZidime (FORTAZ)  IV Stopped (03/24/18 0552)   PRN Meds:.sodium chloride, hydroxypropyl methylcellulose / hypromellose, levalbuterol, magnesium hydroxide, metoprolol tartrate, ondansetron (ZOFRAN) IV, oxyCODONE, sodium chloride flush, sodium chloride flush, traMADol  Xrays No results found.  Assessment/Plan: S/P Procedure(s) (LRB): CORONARY ARTERY BYPASS GRAFTING (CABG) x 4, LIMA-LAD, SVG-RAMUS, SVG-PD, SVG-DIAG, USING LEFT INTERNAL MAMMARY ARTERY AND RIGHT GREATER SAPHENOUS VEIN HARVESTED ENDOSCOPICALLY (N/A) TRANSESOPHAGEAL ECHOCARDIOGRAM (TEE) (N/A)  1 conts to progress well, hemodynamically stable in sinus with PVC's, AHF team assisting with management 2 labs ok, does need K+ replacement 3 some increase in leukocytosis, no fevers, monitor closely- check UA, cont pulm toilet 4 recheck CXR in am 5 have DM coordinator see,  A1C 7.1, d/c insulin and start metformin 6 rehab/pulm toilet- routine  LOS: 8 days    Robert [name] 03/24/2018 Pager [phone]

## 2018-03-24 NOTE — Progress Notes (Signed)
Care taken over from [facility]. Pt comfortably in chair, wife and visitors at bedside. Denies needs at this time.  Fritz Pickerel, RN

## 2018-03-24 NOTE — Discharge Summary (Signed)
Physician Discharge Summary  Patient ID: Robert [name] MRN: [id number] DOB/AGE: March 18, 1941 77 y.o.  Admit date: 03/16/2018 Discharge date: 03/25/2018  Admission Diagnoses: Severe coronary artery disease  Discharge Diagnoses:  Principal Problem:   S/P CABG x 4 Active Problems:   Chronic systolic heart failure Robert [name])  Patient Active Problem List   Diagnosis Date Noted  . Chronic systolic heart failure ([place]) 03/22/2018  . S/P CABG x 4 03/16/2018  . 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 Macular hole, right eye 03/05/2014  . Macular hole of left eye 12/23/2011   History of present illness: Patient is a 77 year old male referred to [name] for cardiothoracic surgical opinion.  He has a history of heavy tobacco abuse of 2 packs/day.  He was admitted to the [name] in mid June with acute respiratory failure, hypertensive crisis, and non-STEMI with acute flash pulmonary edema.  An echocardiogram showed an ejection fraction of 25% without MR or TR.  He subsequently underwent left heart catheterization showing chronic total occlusion of the LAD and chronic total occlusion of the RCA.  He does have a significant family history of coronary disease with his father dying at the age of 10 of a myocardial infarction and mother had a CABG.  He had an MRI viability study which showed some transmural scarring at the apex and inferior septum but with significant viability in the basal posterior wall and the anterior wall.  After thorough evaluation of the patient and his studies Dr. Darcey [name] agreed to proceed with CABG and he was admitted this hospitalization for the procedure.   Discharged Condition: good  [name] Course: Patient was admitted and on 03/16/2018 he was taken to the operating room where he underwent the below described procedure.  He tolerated it well was taken to the surgical intensive care unit in stable  condition.  Postoperative [name] course:  Patient is overall progressed slowly.  He did require some early inotropic support which was able to be weaned over time.  He also required V pacing in the early postoperative period but this has been transitioned off.  The advanced heart failure team has assisted with postoperative management of his heart failure regimen.  All routine lines, monitors and drainage devices have been discontinued in the standard fashion.  He does have significant postoperative volume overload but is improving over time with aggressive diuresis.  He did develop some pulmonary infiltrate on chest x-ray and was started on a course of [place].  He did develop some renal insufficiency which is relatively stable with creatinine 1.3-1.4 range.  Is being monitored closely.  He did have an expected acute blood loss anemia and values are stable with most recent hemoglobin and hematocrit 10.9/32.5 on 03/24/2018.  Blood sugars have been under adequate control with standard measures and plan will be determined for discharge with the assistance of the diabetic coordinator.  Incisions are noted to be healing well without evidence of infection.  He is tolerating diet.  He is tolerating gradually increasing activities using cardiac rehab phase 1 modalities.  Oxygen has been weaned and he maintains good saturations on room air.  He does have frequent PVCs and is on amiodarone orally.  At the time of discharge the patient is felt to be quite stable.   Consults: cardiology  Significant Diagnostic Studies: Routine postoperative labs and serial chest x-rays.  Treatments: surgery:  OPERATIVE REPORT  DATE OF PROCEDURE:  03/16/2018  OPERATION: 1.  Coronary artery  bypass grafting x4 (left internal mammary artery to left anterior descending, saphenous vein graft to diagonal, saphenous vein graft to ramus intermedius, saphenous vein graft to posterior descending). 2.  Endoscopic harvest of right leg  greater saphenous vein.  SURGEON:  Robert Childs, MD  ASSISTANT:  Robert Pierini, PA-C  PREOPERATIVE DIAGNOSES: 1.  Recent non-ST elevation myocardial infarction in June 2019 with ischemic cardiomyopathy and severe 2-vessel coronary artery disease in a diabetic pattern.   2.  Ejection fraction 25% to 30%.  POSTOPERATIVE DIAGNOSES: 1.  Recent non-ST elevation myocardial infarction in June 2019 with ischemic cardiomyopathy and severe 2-vessel coronary artery disease in a diabetic pattern.   2.  Ejection fraction 25% to 30%.    ANESTHESIA:  General.    Discharge Exam: Blood pressure 133/66, pulse 65, temperature 97.7 F (36.5 C), temperature source Oral, resp. rate 17, height 5\' 9"  (1.753 m), weight 88.1 kg, SpO2 96 %.  General appearance: alert, cooperative and no distress Heart: regular rate and rhythm Lungs: diminished breath sounds bibasilar Abdomen: soft, non-tender; bowel sounds normal; no masses,  no organomegaly Extremities: edema trace Wound: clean and dry  Disposition: Home   Allergies as of 03/25/2018      Reactions   Fluorescein Hives, Itching   Ivp Dye [iodinated Diagnostic Agents] Hives, Itching, Other (See Comments)   Face got splotchy.     Typhoid Vaccines Swelling   Yellow Dye Hives, Itching   Dye in eye test   Codeine Other (See Comments)   Puts me to sleep      Medication List    STOP taking these medications   aspirin 81 MG chewable tablet Replaced by:  aspirin 325 MG EC tablet   digoxin 0.125 MG tablet Commonly known as:  LANOXIN   isosorbide mononitrate 30 MG 24 hr tablet Commonly known as:  IMDUR   MUCINEX SINUS-MAX 5-10-200-325 MG Caps Generic drug:  Phenylephrine-DM-GG-APAP   predniSONE 10 MG (21) Tbpk tablet Commonly known as:  STERAPRED UNI-PAK 21 TAB   sacubitril-valsartan 24-26 MG Commonly known as:  ENTRESTO Replaced by:  sacubitril-valsartan 49-51 MG     TAKE these medications   amiodarone 200 MG tablet Commonly  known as:  PACERONE Take 1 tablet (200 mg total) by mouth 2 (two) times daily. For 7 days then decrease to 200 mg daily   amoxicillin-clavulanate 875-125 MG tablet Commonly known as:  AUGMENTIN Take 1 tablet by mouth 2 (two) times daily.   aspirin 325 MG EC tablet Take 1 tablet (325 mg total) by mouth daily. Replaces:  aspirin 81 MG chewable tablet   atorvastatin 40 MG tablet Commonly known as:  LIPITOR Take 1 tablet (40 mg total) by mouth at bedtime.   carvedilol 6.25 MG tablet Commonly known as:  COREG Take 1 tablet (6.25 mg total) by mouth 2 (two) times daily.   furosemide 20 MG tablet Commonly known as:  LASIX Take 20 mg by mouth daily with supper. What changed:  Another medication with the same name was changed. Make sure you understand how and when to take each.   furosemide 40 MG tablet Commonly known as:  LASIX Take 40 mg (1 tab) every morning, and 20 mg (1/2 tab) every evening. What changed:    how much to take  how to take this  when to take this  additional instructions   guaiFENesin 600 MG 12 hr tablet Commonly known as:  MUCINEX Take 1 tablet (600 mg total) by mouth 2 (two) times  daily.   sacubitril-valsartan 49-51 MG Commonly known as:  ENTRESTO Take 1 tablet by mouth 2 (two) times daily. Replaces:  sacubitril-valsartan 24-26 MG   spironolactone 25 MG tablet Commonly known as:  ALDACTONE Take 1 tablet (25 mg total) by mouth daily. What changed:  how much to take   SYSTANE 0.4-0.3 % Soln Generic drug:  Polyethyl Glycol-Propyl Glycol Place 1 drop into both eyes 2 (two) times daily.   traMADol 50 MG tablet Commonly known as:  ULTRAM Take 1 tablet (50 mg total) by mouth every 4 (four) hours as needed for moderate pain.      Follow-up Information    Ivin Poot, MD Follow up.   Specialty:  Cardiothoracic Surgery Why:  Appointment see the surgeon on 04/26/2018 at 1 PM.  Please obtain a chest x-ray at [place] [facility] at 12:30 PM.   [facility] imaging is located in the same office complex on the first floor. Contact information: [place] [address] [phone]        Larey Dresser, MD Follow up.   Specialty:  Cardiology Why:  Please see discharge paperwork for a appointment to see cardiology. Contact information: [address] [place] [address] [phone]          The patient has been discharged on:   1.Beta Blocker:  Yes [  y ]                              No   [   ]                              If No, reason:  2.Ace Inhibitor/ARB: Yes Blue.Reese   ]                                     No  [    ]                                     If No, reason:  3.Statin:   Yes [ y  ]                  No  [   ]                  If No, reason:  4.Ecasa:  Yes  Blue.Reese   ]                  No   [   ]                  If No, reason:  Signed:  Original Note by Robert Pierini PA-C  [place] 03/25/2018, 9:26 AM

## 2018-03-24 NOTE — Care Management Important Message (Signed)
Important Message  Patient Details  Name: Robert [name] MRN: [id number] Date of Birth: 08-30-1940   Medicare Important Message Given:  Yes    Barb Merino Ottilie Wigglesworth 03/24/2018, 2:59 PM

## 2018-03-25 LAB — BASIC METABOLIC PANEL
Anion gap: 11 (ref 5–15)
BUN: 29 mg/dL — ABNORMAL HIGH (ref 8–23)
CO2: 30 mmol/L (ref 22–32)
Calcium: 8.3 mg/dL — ABNORMAL LOW (ref 8.9–10.3)
Chloride: 99 mmol/L (ref 98–111)
Creatinine, Ser: 1.32 mg/dL — ABNORMAL HIGH (ref 0.61–1.24)
GFR calc Af Amer: 60 mL/min — ABNORMAL LOW (ref >60)
GFR calc non Af Amer: 51 mL/min — ABNORMAL LOW (ref >60)
Glucose, Bld: 127 mg/dL — ABNORMAL HIGH (ref 70–99)
Potassium: 3.9 mmol/L (ref 3.5–5.1)
Sodium: 140 mmol/L (ref 135–145)

## 2018-03-25 LAB — GLUCOSE, CAPILLARY
Glucose-Capillary: 94 mg/dL (ref 70–99)
Glucose-Capillary: 143 mg/dL — ABNORMAL HIGH (ref 70–99)

## 2018-03-25 MED ORDER — AMIODARONE HCL 200 MG PO TABS: 200.0000 mg | ORAL_TABLET | Freq: Two times a day (BID) | ORAL | 1 refills | Status: DC

## 2018-03-25 MED ORDER — SACUBITRIL-VALSARTAN 49-51 MG PO TABS: 1.0000 | ORAL_TABLET | Freq: Two times a day (BID) | ORAL | 3 refills | Status: DC

## 2018-03-25 MED ORDER — ASPIRIN 325 MG PO TBEC: 325.0000 mg | DELAYED_RELEASE_TABLET | Freq: Every day | ORAL | 0 refills | Status: DC

## 2018-03-25 MED ORDER — AMOXICILLIN-POT CLAVULANATE 875-125 MG PO TABS: 1.0000 | ORAL_TABLET | Freq: Two times a day (BID) | ORAL | 0 refills | Status: DC

## 2018-03-25 MED ORDER — SPIRONOLACTONE 25 MG PO TABS: 25.0000 mg | ORAL_TABLET | Freq: Every day | ORAL | 3 refills | Status: DC

## 2018-03-25 MED ORDER — GUAIFENESIN ER 600 MG PO TB12: 600.0000 mg | ORAL_TABLET | Freq: Two times a day (BID) | ORAL | Status: DC

## 2018-03-25 MED ORDER — TRAMADOL HCL 50 MG PO TABS: 50.0000 mg | ORAL_TABLET | ORAL | 0 refills | Status: DC | PRN

## 2018-03-25 NOTE — Progress Notes (Signed)
Pacing wires d/c per order and protocol. Pt in bed, tolerated well, ends intact. Pt instructed to stay on bedrest for 1 hour.

## 2018-03-25 NOTE — Progress Notes (Signed)
03/25/2018 3:35 PM Discharge AVS meds taken today and those due this evening reviewed.  Follow-up appointments and when to call md reviewed.  D/C IV and TELE.  Questions and concerns addressed.   D/C home per orders. [place]

## 2018-03-25 NOTE — Discharge Instructions (Signed)
Heart Failure Action Plan A heart failure action plan helps you understand what to do when you have symptoms of heart failure. Follow the plan that was created by you and your health care provider. Review your plan each time you visit your health care provider. Red zone These signs and symptoms mean you should get medical help right away:  You have trouble breathing when resting.  You have a dry cough that is getting worse.  You have swelling or pain in your legs or abdomen that is getting worse.  You suddenly gain more than 2-3 lb (0.9-1.4 kg) in a day, or more than 5 lb (2.3 kg) in one week. This amount may be more or less depending on your condition.  You have trouble staying awake or you feel confused.  You have chest pain.  You do not have an appetite.  You pass out.  If you experience any of these symptoms:  Call your local emergency services (911 in the [place] right away or seek help at the emergency department of the nearest hospital.  Yellow zone These signs and symptoms mean your condition may be getting worse and you should make some changes:  You have trouble breathing when you are active or you need to sleep with extra pillows.  You have swelling in your legs or abdomen.  You gain 2-3 lb (0.9-1.4 kg) in one day, or 5 lb (2.3 kg) in one week. This amount may be more or less depending on your condition.  You get tired easily.  You have trouble sleeping.  You have a dry cough.  If you experience any of these symptoms:  Contact your health care provider within the next day.  Your health care provider may adjust your medicines.  Green zone These signs mean you are doing well and can continue what you are doing:  You do not have shortness of breath.  You have very little swelling or no new swelling.  Your weight is stable (no gain or loss).  You have a normal activity level.  You do not have chest pain or any other new symptoms.  Follow these  instructions at home:  Take over-the-counter and prescription medicines only as told by your health care provider.  Weigh yourself daily. Your target weight is __________ lb (__________ kg). ? Call your health care provider if you gain more than __________ lb (__________ kg) in a day, or more than ____3-5______ lb (__________ kg) in one week.  Eat a heart-healthy diet. Work with a diet and nutrition specialist (dietitian) to create an eating plan that is best for you.  Keep all follow-up visits as told by your health care provider. This is important. Where to find more information:  American Heart Association: www.heart.org Summary  Follow the action plan that was created by you and your health care provider.  Get help right away if you have any symptoms in the Red zone. This information is not intended to replace advice given to you by your health care provider. Make sure you discuss any questions you have with your health care provider. Document Released: 07/31/2016 Document Revised: 07/31/2016 Document Reviewed: 07/31/2016 Elsevier Interactive Patient Education  2018 [employer].   Discharge Instructions:  1. You may shower, please wash incisions daily with soap and water and keep dry.  If you wish to cover wounds with dressing you may do so but please keep clean and change daily.  No tub baths or swimming until incisions have completely healed.  If your incisions become red or develop any drainage please call our office at [phone]  2. No Driving until cleared by Dr. Lucianne Lei Trigt's office and you are no longer using narcotic pain medications  3. Monitor your weight daily.. Please use the same scale and weigh at same time... If you gain 5-10 lbs in 48 hours with associated lower extremity swelling, please contact our office at [phone]  4. Fever of 101.5 for at least 24 hours with no source, please contact our office at [phone]  5. Activity- up as tolerated, please  walk at least 3 times per day.  Avoid strenuous activity, no lifting, pushing, or pulling with your arms over 8-10 lbs for a minimum of 6 weeks  6. If any questions or concerns arise, please do not hesitate to contact our office at [phone]

## 2018-03-25 NOTE — Progress Notes (Signed)
[id number] Education completed with patient and wife. Reviewed use of incentive spirometer and flutter valve. Exercise instructions reviewed with the patient. Discussed RPE scale end points of exercise. Heart failure low sodium diabetic diet reviewed. Mr Robert [name] is interested in participating in phase 2 cardiac rehab in [place]. Reviewed sternal precautions and CHF booklet with patient and wife. Mr Robert [name] has a scale at home and continues to abstain from smoking.Barnet Pall, RN,BSN 03/25/2018 12:55 PM

## 2018-03-25 NOTE — Progress Notes (Addendum)
Pt walked halls early this am independently. RA, rolling walker. Approx 900 ft. Tolerated well. Pt put himself to bed.

## 2018-03-25 NOTE — Progress Notes (Addendum)
      [address]       [place] [address]             [phone]      9 Days Post-Op Procedure(s) (LRB): CORONARY ARTERY BYPASS GRAFTING (CABG) x 4, LIMA-LAD, SVG-RAMUS, SVG-PD, SVG-DIAG, USING LEFT INTERNAL MAMMARY ARTERY AND RIGHT GREATER SAPHENOUS VEIN HARVESTED ENDOSCOPICALLY (N/A) TRANSESOPHAGEAL ECHOCARDIOGRAM (TEE) (N/A)   Subjective:  No acute complaints.  He has already ambulated around the hallway 5 times this morning independently.  He really wants to go home today.  Objective: Vital signs in last 24 hours: Temp:  [97.5 F (36.4 C)-98.4 F (36.9 C)] 97.7 F (36.5 C) (09/21 0806) Pulse Rate:  [63-76] 65 (09/21 0806) Cardiac Rhythm: Normal sinus rhythm;Bundle branch block (09/21 0700) Resp:  [15-18] 17 (09/21 0806) BP: (118-139)/(57-66) 133/66 (09/21 0806) SpO2:  [93 %-98 %] 96 % (09/21 0848) Weight:  [88.1 kg] 88.1 kg (09/21 0355)   Intake/Output from previous day: 09/20 0701 - 09/21 0700 In: 200 [IV Piggyback:200] Out: 1500 [Urine:1500]  General appearance: alert, cooperative and no distress Heart: regular rate and rhythm Lungs: diminished breath sounds bibasilar Abdomen: soft, non-tender; bowel sounds normal; no masses,  no organomegaly Extremities: edema trace Wound: clean and dry  Lab Results: Recent Labs    03/24/18 0436  WBC 14.1*  HGB 10.9*  HCT 32.5*  PLT 271   BMET:  Recent Labs    03/24/18 0436 03/25/18 0307  NA 140 140  K 3.3* 3.9  CL 97* 99  CO2 30 30  GLUCOSE 89 127*  BUN 33* 29*  CREATININE 1.41* 1.32*  CALCIUM 8.3* 8.3*    PT/INR: No results for input(s): LABPROT, INR in the last 72 hours. ABG    Component Value Date/Time   PHART 7.298 (L) 03/16/2018 1848   HCO3 22.2 03/16/2018 1848   TCO2 27 03/19/2018 1715   ACIDBASEDEF 4.0 (H) 03/16/2018 1848   O2SAT 65.6 03/21/2018 0510   CBG (last 3)  Recent Labs    03/24/18 1706 03/24/18 2114 03/25/18 0608  GLUCAP 104* 110* 94    Assessment/Plan: S/P  Procedure(s) (LRB): CORONARY ARTERY BYPASS GRAFTING (CABG) x 4, LIMA-LAD, SVG-RAMUS, SVG-PD, SVG-DIAG, USING LEFT INTERNAL MAMMARY ARTERY AND RIGHT GREATER SAPHENOUS VEIN HARVESTED ENDOSCOPICALLY (N/A) TRANSESOPHAGEAL ECHOCARDIOGRAM (TEE) (N/A)  1. CV- hemodynamically stable, continue Coreg, Entresto per AHF, on Amiodarone for previous A. Fib, will taper over the next several weeks 2. Pulm- on IV ABX, for possible pneumonia, remains afebrile, will give a 10 day course of Augmentin, off oxygen, no acute issues 3. Renal- creatinine remains stable, diuretics Lasix, Spiro per AHF 4. Expected post operative Thrombocytopenia, resolved 5. Dispo- patient stable, d/c EPW today, if no arrhythmias will d/c home later this afternoon   LOS: 9 days    Ellwood Handler 03/25/2018  Patient feels well, ambulated 5 times this am already Plan home later today  I have seen and examined Robert [name] and agree with the above assessment  and plan.  Grace Isaac MD [place] [phone] Office [phone] 03/25/2018 11:52 AM

## 2018-03-27 ENCOUNTER — Telehealth: Payer: Self-pay | Admitting: Interventional Cardiology

## 2018-03-27 ENCOUNTER — Telehealth: Payer: Self-pay

## 2018-03-27 NOTE — Telephone Encounter (Signed)
Mrs. Ryle called with questions about her husband's medications after discharge from the hospital.  She was unsure how much Spironolactone he should take.  She stated that she was giving him half a tablet.  I advised that he should be taking 25mg  according to his discharge summary.  She acknowledged receipt.  Also, she wanted to know what kind of musinex he should be taking at home.  I advised he should only take regular musinex every 12 hours as needed.  She acknowledged receipt.  She also asked about his lifevest that he was wearing before surgery.  She stated that she wanted to return it, but the company they got it from said they needed an order before picking it up.  I advised her to contact his Cardiologist office for more information.  She acknowledged receipt and thanked me for the call back.

## 2018-03-27 NOTE — Telephone Encounter (Signed)
Robert [name] spoke to patient and gave him returning instructions.  He verbalized understanding.

## 2018-03-27 NOTE — Telephone Encounter (Signed)
° °  Patient's spouse calling with questions regarding retuning life vest. Please call

## 2018-03-28 ENCOUNTER — Telehealth (HOSPITAL_COMMUNITY): Payer: Self-pay

## 2018-03-28 NOTE — Telephone Encounter (Signed)
Pt insurance is active and benefits verified through Medicare A/B. Co-pay $0.00, DED $185.00/$185.00 met, out of pocket $0.00/$0.00 met, co-insurance 20%. No pre-authorization. Passport, 03/28/18 @ 10:58AM, PFR#[id number]  2ndary insurance is active and benefits verified through [employer]. Co-pay $0.00, DED $0.00/$0.00 met, out of pocket $0.00/$0.00 met, co-insurance 0%. No pre-authorization. Alex[employer], 03/28/18 @ 11:48AM, [phone]  Will contact patient to see if he is interested in the Cardiac Rehab Program. If interested, patient will need to complete follow up appt. Once completed, patient will be contacted for scheduling upon review by the RN Navigator.

## 2018-03-28 NOTE — Telephone Encounter (Signed)
Attempted to call patient in regards to Cardiac Rehab - LM on VM 

## 2018-04-04 ENCOUNTER — Ambulatory Visit (HOSPITAL_COMMUNITY)
Admission: RE | Admit: 2018-04-04 | Discharge: 2018-04-04 | Disposition: A | Discharge status: Home / Self Care | Payer: Medicare Other | Source: Ambulatory Visit | Attending: Cardiology | Admitting: Cardiology

## 2018-04-04 ENCOUNTER — Encounter (HOSPITAL_COMMUNITY): Payer: Self-pay | Admitting: Cardiology

## 2018-04-04 ENCOUNTER — Other Ambulatory Visit: Payer: Self-pay

## 2018-04-04 VITALS — BP 130/65 | HR 79 | Wt 190.0 lb

## 2018-04-04 DIAGNOSIS — I5022 Chronic systolic (congestive) heart failure: Secondary | ICD-10-CM | POA: Diagnosis not present

## 2018-04-04 DIAGNOSIS — I255 Ischemic cardiomyopathy: Secondary | ICD-10-CM | POA: Insufficient documentation

## 2018-04-04 DIAGNOSIS — Z8249 Family history of ischemic heart disease and other diseases of the circulatory system: Secondary | ICD-10-CM | POA: Diagnosis not present

## 2018-04-04 DIAGNOSIS — Z87891 Personal history of nicotine dependence: Secondary | ICD-10-CM | POA: Diagnosis not present

## 2018-04-04 DIAGNOSIS — Z7982 Long term (current) use of aspirin: Secondary | ICD-10-CM | POA: Insufficient documentation

## 2018-04-04 DIAGNOSIS — E785 Hyperlipidemia, unspecified: Secondary | ICD-10-CM | POA: Insufficient documentation

## 2018-04-04 DIAGNOSIS — Z79899 Other long term (current) drug therapy: Secondary | ICD-10-CM | POA: Diagnosis not present

## 2018-04-04 DIAGNOSIS — Z951 Presence of aortocoronary bypass graft: Secondary | ICD-10-CM | POA: Insufficient documentation

## 2018-04-04 DIAGNOSIS — I2510 Atherosclerotic heart disease of native coronary artery without angina pectoris: Secondary | ICD-10-CM | POA: Insufficient documentation

## 2018-04-04 DIAGNOSIS — J449 Chronic obstructive pulmonary disease, unspecified: Secondary | ICD-10-CM | POA: Diagnosis not present

## 2018-04-04 DIAGNOSIS — I252 Old myocardial infarction: Secondary | ICD-10-CM | POA: Diagnosis not present

## 2018-04-04 LAB — CBC
HCT: 39.3 % (ref 39.0–52.0)
Hemoglobin: 12.6 g/dL — ABNORMAL LOW (ref 13.0–17.0)
MCH: 31.4 pg (ref 26.0–34.0)
MCHC: 32.1 g/dL (ref 30.0–36.0)
MCV: 98 fL (ref 78.0–100.0)
Platelets: 319 10*3/uL (ref 150–400)
RBC: 4.01 MIL/uL — ABNORMAL LOW (ref 4.22–5.81)
RDW: 12.6 % (ref 11.5–15.5)
WBC: 13.6 10*3/uL — ABNORMAL HIGH (ref 4.0–10.5)

## 2018-04-04 LAB — COMPREHENSIVE METABOLIC PANEL
ALT: 19 U/L (ref 0–44)
AST: 18 U/L (ref 15–41)
Albumin: 3.4 g/dL — ABNORMAL LOW (ref 3.5–5.0)
Alkaline Phosphatase: 113 U/L (ref 38–126)
Anion gap: 11 (ref 5–15)
BUN: 9 mg/dL (ref 8–23)
CO2: 26 mmol/L (ref 22–32)
Calcium: 8.5 mg/dL — ABNORMAL LOW (ref 8.9–10.3)
Chloride: 100 mmol/L (ref 98–111)
Creatinine, Ser: 1.09 mg/dL (ref 0.61–1.24)
GFR calc Af Amer: >60 mL/min (ref >60)
GFR calc non Af Amer: >60 mL/min (ref >60)
Glucose, Bld: 92 mg/dL (ref 70–99)
Potassium: 3.7 mmol/L (ref 3.5–5.1)
Sodium: 137 mmol/L (ref 135–145)
Total Bilirubin: 0.6 mg/dL (ref 0.3–1.2)
Total Protein: 7.1 g/dL (ref 6.5–8.1)

## 2018-04-04 LAB — TSH: TSH: 2.627 u[IU]/mL (ref 0.350–4.500)

## 2018-04-04 MED ORDER — CARVEDILOL 12.5 MG PO TABS: 12.5000 mg | ORAL_TABLET | Freq: Two times a day (BID) | ORAL | 3 refills | Status: DC

## 2018-04-04 MED ORDER — FUROSEMIDE 40 MG PO TABS: 40.0000 mg | ORAL_TABLET | Freq: Every day | ORAL | 6 refills | Status: DC

## 2018-04-04 NOTE — Progress Notes (Signed)
HF Cardiology: Dr. Aundra Dubin  77 yo with history of CAD and ischemic cardiomyopathy presents for followup of CHF. Patient had no cardiac history prior to 6/19. However, since around 12/18, he had noted exertional dyspnea.  In 6/19, [place] the [place].  He developed very severe dyspnea and went to the hospital in [place] where he was found to have NSTEMI.  LHC was done, showing chronic occlusions of the LAD and RCA with collaterals.  Echo showed EF 25-30%.  No intervention, he was eventually discharged to come home.  He has been seen by Dr. Prescott Gum for CABG evaluation.  Cardiac MRI in 6/19 showed EF 28% with substantial viability.  RHC was done in 7/19, showing mildly elevated filling pressures but low cardiac output, with CI 1.7.  After RHC, I increased his Lasix and added digoxin.    Of note, he has quit smoking since 6/19.   He was admitted for CABG in 9/19.  Volume overload post-op, treated with diuresis.  Now home.   He says that he is feeling good symptomatically.  Minimal pain at surgical site.  He is starting to walk more, still using walker for balance most of the time.  No dyspnea walking on flat ground.  No orthopnea/PND.  Weight is down 15 lbs.    ECG (personally reviewed): NSR, inferolateral TWIs  Labs (7/19): hgb 14.1, K 4.1, creatinine 1.26, LDL 28 Labs (9/19): K 3.7, creatinine 1.09, LFTs normal, hgb 12.6  PMH: 1. H/o retinal detachment 2. H/o transverse myelitis (remote) 3. CAD: NSTEMI 6/19 in [place]. - LHC (6/19): Chronic total occlusion of LAD and chronic total occlusion of RCA with collaterals.  - CABG (9/19): LIMA-LAD, SVG-D, SVG-ramus, SVG-PDA.  4. Chronic systolic CHF: Ischemic cardiomyopathy.   - Echo (6/19): LV moderately dilated, EF 25-30%, mild MR.  - Cardiac MRI (6/19): EF 28%, significant viability noted.  - RHC (7/19): mean RA 8, PA 40/16 mean 26, mean PCWP 23, CI 1.7, PVR 0.83 WU 5. COPD: PFTs (9/19) with moderate obstruction.    SH: Married, lives in [place].  He quit smoking in 6/19.    Family History  Problem Relation Age of Onset  . Heart disease Mother   . Cancer Mother   . Heart attack Father    ROS: All systems reviewed and negative except as per HPI.   Current Outpatient Medications  Medication Sig Dispense Refill  . amiodarone (PACERONE) 200 MG tablet Take 200 mg by mouth every evening.    Marland Kitchen aspirin EC 325 MG EC tablet Take 1 tablet (325 mg total) by mouth daily. 30 tablet 0  . atorvastatin (LIPITOR) 40 MG tablet Take 1 tablet (40 mg total) by mouth at bedtime. 90 tablet 3  . carvedilol (COREG) 12.5 MG tablet Take 1 tablet (12.5 mg total) by mouth 2 (two) times daily. 60 tablet 3  . furosemide (LASIX) 40 MG tablet Take 1 tablet (40 mg total) by mouth daily. 45 tablet 6  . guaiFENesin (MUCINEX) 600 MG 12 hr tablet Take 1 tablet (600 mg total) by mouth 2 (two) times daily.    Vladimir Faster Glycol-Propyl Glycol (SYSTANE) 0.4-0.3 % SOLN Place 1 drop into both eyes 2 (two) times daily.    . sacubitril-valsartan (ENTRESTO) 49-51 MG Take 1 tablet by mouth 2 (two) times daily. 60 tablet 3  . spironolactone (ALDACTONE) 25 MG tablet Take 1 tablet (25 mg total) by mouth daily. 30 tablet 3  . traMADol (ULTRAM) 50 MG tablet Take  1 tablet (50 mg total) by mouth every 4 (four) hours as needed for moderate pain. 30 tablet 0   No current facility-administered medications for this encounter.    BP 130/65   Pulse 79   Wt 86.2 kg (190 lb)   SpO2 99%   BMI 28.06 kg/m  General: NAD Neck: No JVD, no thyromegaly or thyroid nodule.  Lungs: Clear to auscultation bilaterally with normal respiratory effort. CV: Nondisplaced PMI.  Heart regular S1/S2, no S3/S4, no murmur.  No peripheral edema.  No carotid bruit.  Normal pedal pulses.  Abdomen: Soft, nontender, no hepatosplenomegaly, no distention.  Skin: Intact without lesions or rashes.  Neurologic: Alert and oriented x 3.  Psych: Normal affect. Extremities: No  clubbing or cyanosis.  HEENT: Normal.   Assessment/Plan: 1. CAD: LHC in 6/19 with CTOs of LAD and RCA.  Cardiac MRI showed significant myocardial viability. He is s/p CABG x 4 in 9/19.   - Continue ASA 81 and statin.  - Should be able to start cardiac rehab in a few weeks, will make referral.   - He was started on amiodarone for peri-op prophylaxis of atrial fibrillation.  Will stop 1 month post-op. Check LFTs and TSH today.  2. Chronic systolic CHF: Ischemic cardiomyopathy.  Echo in 6/19 with EF 25-20%.  Cardiac MRI in 7/19 with EF 28%, significant viability noted. RHC in 7/19 showed mildly elevated filling pressure and low cardiac output. Now s/p CABG in 9/19.  Doing well today, NYHA class II symptoms and not volume overloaded. - Continue Entresto 49/51 bid.  - Increase Coreg to 9.375 mg bid x 4 days then up to 12.5 mg bid after that.  - Continue spironolactone 25 mg daily.  - Now off Lifevest.  - Think we can decrease Lasix to 40 mg daily.  BMET today.   - Repeat echo 3 months post-cath to evaluate for ICD.  3. COPD: He has quit smoking since 6/19.  4. Hyperlipidemia: Good LDL in 7/19.   Followup in 1 month with APP.  See me in 3 months with echo.    [name] 04/04/2018

## 2018-04-04 NOTE — Patient Instructions (Signed)
Decrease Lasix to 40mg  daily   Increase coreg to 9.375mg  (1.5 tablets)  for 4 days twice a day and then start 12.5mg  twice a day after that.  Labs done today as well as EKG  Referral to Cardiac Rehab they will call you to set up an appointment,   Your physician recommends that you schedule a follow-up appointment in: 1 month with APP  Your physician recommends that you schedule a follow-up appointment 12/19 with Dr. Aundra Dubin and ECHO  Your physician has requested that you have an echocardiogram. Echocardiography is a painless test that uses sound waves to create images of your heart. It provides your doctor with information about the size and shape of your heart and how well your heart's chambers and valves are working. This procedure takes approximately one hour. There are no restrictions for this procedure.

## 2018-04-06 NOTE — Telephone Encounter (Signed)
Called patient to see if he was interested in participating in the Cardiac Rehab Program. Patient stated yes. Patient will come in for orientation on 06/06/2018 @ 1:30PM and will attend the 11:15AM exercise class.  Mailed homework package.  Went over insurance, patient verbalized understanding.

## 2018-04-25 ENCOUNTER — Other Ambulatory Visit: Payer: Self-pay | Admitting: Cardiothoracic Surgery

## 2018-04-25 DIAGNOSIS — Z951 Presence of aortocoronary bypass graft: Secondary | ICD-10-CM

## 2018-04-26 ENCOUNTER — Ambulatory Visit
Admission: RE | Admit: 2018-04-26 | Discharge: 2018-04-26 | Disposition: A | Discharge status: Home / Self Care | Payer: Medicare Other | Source: Ambulatory Visit | Attending: Cardiothoracic Surgery | Admitting: Cardiothoracic Surgery

## 2018-04-26 ENCOUNTER — Encounter: Payer: Self-pay | Admitting: Cardiothoracic Surgery

## 2018-04-26 ENCOUNTER — Ambulatory Visit (INDEPENDENT_AMBULATORY_CARE_PROVIDER_SITE_OTHER): Payer: Self-pay | Admitting: Cardiothoracic Surgery

## 2018-04-26 VITALS — BP 106/67 | HR 67 | Resp 20 | Ht 69.0 in | Wt 187.0 lb

## 2018-04-26 DIAGNOSIS — Z951 Presence of aortocoronary bypass graft: Secondary | ICD-10-CM | POA: Diagnosis not present

## 2018-04-26 DIAGNOSIS — I2510 Atherosclerotic heart disease of native coronary artery without angina pectoris: Secondary | ICD-10-CM

## 2018-04-26 NOTE — Progress Notes (Signed)
PCP is System, Pcp Not In Referring Provider is Larey Dresser, MD  Chief Complaint  Patient presents with  . Routine Post Op    f/u from surgery with CXR s/p CABG x4, 03/16/2018    HPI: Scheduled one month follow-up after CABG x4.  Patient had preoperative MI, ischemic cardiomyopathy and was treated in the advanced heart failure clinic to optimize his cardiac status prior to surgery.  He has chronic bronchitis, history of transverse myelitis, and had a previous AICD.  He did well with surgery with some mild pulmonary problems which improved with diuresis, nebulizer, and pulmonary toilet.  He was sent home on amiodarone for some transient postop A. fib.  He has done well at home without recurrent angina.  He is able to walk approximately 10 minutes daily.  He is scheduled to start outpatient cardiac rehab at [facility] next month.  His surgical incisions are healing well.  He has lost approximately 10-12 pounds. Chest x-ray today shows clear lung fields, sternal wires intact, no pleural effusion.   Past Medical History:  Diagnosis Date  . Childhood asthma   . Coronary artery disease   . Hay fever   . Hypertension   . Macular hole of left eye 12/23/2011  . Macular hole, right eye 03/05/2014  . Myocardial infarction [facility])    June 2019  . Neuromuscular disorder (HCC)    numbness in feet  . PONV (postoperative nausea and vomiting)   . Presence of external cardiac defibrillator 2019  . 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 Sinus headache    "hay fever season; not that often"  . Transverse myelitis ([place]) 2009   was treated by Dr Erling Cruz  . Umbilical hernia     Past Surgical History:  Procedure Laterality Date  . [place] VITRECTOMY WITH 20 GAUGE MVR PORT FOR MACULAR HOLE Left 03/08/2013   Procedure: 25 GAUGE PARS PLANA VITRECTOMY WITH 20 GAUGE MVR PORT FOR MACULAR HOLE;  Surgeon: Hayden Pedro, MD;  Location: [place];  Service: Ophthalmology;  Laterality:  Left;  . 25 GAUGE PARS PLANA VITRECTOMY WITH 20 GAUGE MVR PORT FOR MACULAR HOLE Right 04/02/2014   Procedure: 20-25 GAUGE PARS PLANA VITRECTOMY ; MEMBRANE PEEL; HEADSCOPE LASER; SERUM PATCH; GAS/FLUID EXCHANGE ;C3F8;  Surgeon: Hayden Pedro, MD;  Location: [place];  Service: Ophthalmology;  Laterality: Right;  . CATARACT EXTRACTION W/ INTRAOCULAR LENS  IMPLANT, BILATERAL Bilateral 2014  . CORONARY ARTERY BYPASS GRAFT N/A 03/16/2018   Procedure: CORONARY ARTERY BYPASS GRAFTING (CABG) x 4, LIMA-LAD, SVG-RAMUS, SVG-PD, SVG-DIAG, USING LEFT INTERNAL MAMMARY ARTERY AND RIGHT GREATER SAPHENOUS VEIN HARVESTED ENDOSCOPICALLY;  Surgeon: Ivin Poot, MD;  Location: [place];  Service: Open Heart Surgery;  Laterality: N/A;  . EYE SURGERY    . GAS INSERTION Left 03/08/2013   Procedure: INSERTION OF GAS;  Surgeon: Hayden Pedro, MD;  Location: [place];  Service: Ophthalmology;  Laterality: Left;  C3F8  . GAS INSERTION Right 05/07/2014   Procedure: INSERTION OF GAS (C3F8);  Surgeon: Hayden Pedro, MD;  Location: [place];  Service: Ophthalmology;  Laterality: Right;  . GAS/FLUID EXCHANGE Right 05/07/2014   Procedure: GAS/FLUID EXCHANGE;  Surgeon: Hayden Pedro, MD;  Location: [place];  Service: Ophthalmology;  Laterality: Right;  . LASER PHOTO ABLATION Right 05/07/2014   Procedure: LASER PHOTO ABLATION;  Surgeon: Hayden Pedro, MD;  Location: [place];  Service: Ophthalmology;  Laterality: Right;  . PARS PLANA VITRECTOMY  01/18/2012   Procedure: PARS PLANA VITRECTOMY WITH 25 GAUGE;  Surgeon: Hayden Pedro, MD;  Location: [place];  Service: Ophthalmology;  Laterality: Left;  25g. ppv for macular hole ; Laser treatment;Serum patch and Gas Injection(C3F8)  . PARS PLANA VITRECTOMY Right 05/07/2014   Procedure: PARS PLANA VITRECTOMY WITH 25 GAUGE;  Surgeon: Hayden Pedro, MD;  Location: [place];  Service: Ophthalmology;  Laterality: Right;  . PARS PLANA VITRECTOMY W/ REPAIR OF MACULAR HOLE Right 04/02/2014  .  PHOTOCOAGULATION WITH LASER Left 03/08/2013   Procedure: PHOTOCOAGULATION WITH LASER;  Surgeon: Hayden Pedro, MD;  Location: [place];  Service: Ophthalmology;  Laterality: Left;  Headscope   . REPAIR OF COMPLEX TRACTION RETINAL DETACHMENT Right 05/07/2014   Procedure: REPAIR OF COMPLEX TRACTION RETINAL DETACHMENT;  Surgeon: Hayden Pedro, MD;  Location: [place];  Service: Ophthalmology;  Laterality: Right;  . RIGHT HEART CATH N/A 01/19/2018   Procedure: RIGHT HEART CATH;  Surgeon: Larey Dresser, MD;  Location: [place] [facility];  Service: Cardiovascular;  Laterality: N/A;  . SCLERAL BUCKLE Right 05/07/2014   Procedure: SCLERAL BUCKLE;  Surgeon: Hayden Pedro, MD;  Location: [place];  Service: Ophthalmology;  Laterality: Right;  . TEE WITHOUT CARDIOVERSION N/A 03/16/2018   Procedure: TRANSESOPHAGEAL ECHOCARDIOGRAM (TEE);  Surgeon: Prescott Gum, Collier Salina, MD;  Location: [place];  Service: Open Heart Surgery;  Laterality: N/A;    Family History  Problem Relation Age of Onset  . Heart disease Mother   . Cancer Mother   . Heart attack Father     Social History Social History   Tobacco Use  . Smoking status: Former Smoker    Packs/day: 2.00    Years: 52.00    Pack years: 104.00    Types: Cigarettes    Last attempt to quit: 12/18/2017    Years since quitting: 0.3  . Smokeless tobacco: Never Used  Substance Use Topics  . Alcohol use: No  . Drug use: No    Current Outpatient Medications  Medication Sig Dispense Refill  . amiodarone (PACERONE) 200 MG tablet Take 200 mg by mouth every evening.    Marland Kitchen aspirin EC 325 MG EC tablet Take 1 tablet (325 mg total) by mouth daily. 30 tablet 0  . atorvastatin (LIPITOR) 40 MG tablet Take 1 tablet (40 mg total) by mouth at bedtime. 90 tablet 3  . carvedilol (COREG) 12.5 MG tablet Take 1 tablet (12.5 mg total) by mouth 2 (two) times daily. 60 tablet 3  . furosemide (LASIX) 40 MG tablet Take 1 tablet (40 mg total) by mouth daily. 45 tablet 6  . guaiFENesin  (MUCINEX) 600 MG 12 hr tablet Take 1 tablet (600 mg total) by mouth 2 (two) times daily.    Vladimir Faster Glycol-Propyl Glycol (SYSTANE) 0.4-0.3 % SOLN Place 1 drop into both eyes 2 (two) times daily.    . sacubitril-valsartan (ENTRESTO) 49-51 MG Take 1 tablet by mouth 2 (two) times daily. 60 tablet 3  . spironolactone (ALDACTONE) 25 MG tablet Take 1 tablet (25 mg total) by mouth daily. 30 tablet 3   No current facility-administered medications for this visit.     Allergies  Allergen Reactions  . Fluorescein Hives and Itching  . Ivp Dye [Iodinated Diagnostic Agents] Hives, Itching and Other (See Comments)    Face got splotchy.    . Typhoid Vaccines Swelling  . Yellow Dye Hives and Itching    Dye in eye test  . Codeine Other (See Comments)  Puts me to sleep    Review of Systems  Takes Tylenol for chest soreness No edema Appetite and strength are improving Mild productive cough  BP 106/67   Pulse 67   Resp 20   Ht 5\' 9"  (1.753 m)   Wt 187 lb (84.8 kg)   SpO2 96% Comment: RA  BMI 27.62 kg/m  Physical Exam      Exam    General- alert and comfortable    Neck- no JVD, no cervical adenopathy palpable, no carotid bruit   Lungs- clear without rales, wheezes   Cor- regular rate and rhythm, no murmur , gallop   Abdomen- soft, non-tender   Extremities - warm, non-tender, minimal edema   Neuro- oriented, appropriate, no focal weakness   Diagnostic Tests: Chest x-ray clear  Impression: Excellent early recovery after multivessel CABG for ischemic cardiomyopathy Continue current cardiac meds as directed by the advanced heart failure clinic. Patient may not drive and lift up to 15-20 pounds maximum. He is ready to start outpatient cardiac rehab. He will stop his amiodarone.  Plan: Return for follow-up of progress in 1 month.  Len Childs, MD [facility] [phone]

## 2018-05-04 NOTE — Progress Notes (Signed)
Advanced Heart Failure Clinic Note  HF Cardiology: Dr. Aundra Dubin  77 y.o. with history of CAD and ischemic cardiomyopathy presents for followup of CHF. Patient had no cardiac history prior to 6/19. However, since around 12/18, he had noted exertional dyspnea.  In 6/19, [place].  He developed very severe dyspnea and went to the hospital in [place] where he was found to have NSTEMI.  LHC was done, showing chronic occlusions of the LAD and RCA with collaterals.  Echo showed EF 25-30%.  No intervention, he was eventually discharged to come home.  He has been seen by Dr. Prescott Gum for CABG evaluation.  Cardiac MRI in 6/19 showed EF 28% with substantial viability.  RHC was done in 7/19, showing mildly elevated filling pressures but low cardiac output, with CI 1.7.  After RHC, I increased his Lasix and added digoxin.    Of note, he has quit smoking since 6/19.   He was admitted for CABG in 9/19.  Volume overload post-op, treated with diuresis.  Now home.   He returns today for HF follow up with his wife. Last visit coreg was increased and lasix was decreased. Saw Dr Lucianne Lei Kerby Less 10/23 and amio was stopped. Overall doing well. Denies SOB or edema. Able to walk 500 yards on flat ground with no problems. Sleeping in a recliner for sternal comfort. No CP. Having some dizziness when he stands quickly over the last 2 weeks. He has a productive cough with white sputum. No fever or chills. Mostly stick to low salt diet, but occasionally has a ham biscuit or BBQ. Taking all medications. Weights stable 183 lbs at home, down 4 more lbs on our scale. Starts cardiac rehab in December.   ECG (personally reviewed): NSR, inferolateral TWIs  Labs (7/19): hgb 14.1, K 4.1, creatinine 1.26, LDL 28 Labs (9/19): K 3.7, creatinine 1.09, LFTs normal, hgb 12.6  PMH: 1. H/o retinal detachment 2. H/o transverse myelitis (remote) 3. CAD: NSTEMI 6/19 in [place]. - LHC (6/19): Chronic total  occlusion of LAD and chronic total occlusion of RCA with collaterals.  - CABG (9/19): LIMA-LAD, SVG-D, SVG-ramus, SVG-PDA.  4. Chronic systolic CHF: Ischemic cardiomyopathy.   - Echo (6/19): LV moderately dilated, EF 25-30%, mild MR.  - Cardiac MRI (6/19): EF 28%, significant viability noted.  - RHC (7/19): mean RA 8, PA 40/16 mean 26, mean PCWP 23, CI 1.7, PVR 0.83 WU 5. COPD: PFTs (9/19) with moderate obstruction.   SH: Married, lives in [place].  He quit smoking in 6/19.    Family History  Problem Relation Age of Onset  . Heart disease Mother   . Cancer Mother   . Heart attack Father    Review of systems complete and found to be negative unless listed in HPI.   Current Outpatient Medications  Medication Sig Dispense Refill  . aspirin EC 325 MG EC tablet Take 1 tablet (325 mg total) by mouth daily. 30 tablet 0  . atorvastatin (LIPITOR) 40 MG tablet Take 1 tablet (40 mg total) by mouth at bedtime. 90 tablet 3  . carvedilol (COREG) 12.5 MG tablet Take 1 tablet (12.5 mg total) by mouth 2 (two) times daily. 60 tablet 3  . furosemide (LASIX) 20 MG tablet Take 1 tablet (20 mg total) by mouth daily. May also take 1 tablet (20 mg total) at bedtime as needed for fluid. 45 tablet 3  . guaiFENesin (MUCINEX) 600 MG 12 hr tablet Take 1 tablet (600 mg total)  by mouth 2 (two) times daily.    Vladimir Faster Glycol-Propyl Glycol (SYSTANE) 0.4-0.3 % SOLN Place 1 drop into both eyes 2 (two) times daily.    . sacubitril-valsartan (ENTRESTO) 49-51 MG Take 1 tablet by mouth 2 (two) times daily. 60 tablet 3  . spironolactone (ALDACTONE) 25 MG tablet Take 1 tablet (25 mg total) by mouth daily. 30 tablet 3   No current facility-administered medications for this encounter.    BP 110/70   Pulse 64   Wt 84.6 kg (186 lb 6.4 oz)   SpO2 95%   BMI 27.53 kg/m  Wt Readings from Last 3 Encounters:  05/08/18 84.6 kg (186 lb 6.4 oz)  04/26/18 84.8 kg (187 lb)  04/04/18 86.2 kg (190 lb)   Orthostatics: Sitting:  122/74 Standing: 118/72  General: Well appearing. No resp difficulty. HEENT: Normal Neck: Supple. JVP flat. Carotids 2+ bilat; no bruits. No thyromegaly or nodule noted. Cor: PMI nondisplaced. RRR, No M/G/R noted Lungs: CTAB, normal effort. Abdomen: Soft, non-tender, non-distended, no HSM. No bruits or masses. +BS  Extremities: No cyanosis, clubbing, or rash. R and LLE no edema.  Neuro: Alert & orientedx3, cranial nerves grossly intact. moves all 4 extremities w/o difficulty. Affect pleasant  Assessment/Plan: 1. CAD: LHC in 6/19 with CTOs of LAD and RCA.  Cardiac MRI showed significant myocardial viability. He is s/p CABG x 4 in 9/19.   - Continue ASA 81 and statin.  - Continue cardiac rehab. Has been referred and plans to start early December.  - Now off amiodarone. - No s/s ischemia 2. Chronic systolic CHF: Ischemic cardiomyopathy.  Echo in 6/19 with EF 25-20%.  Cardiac MRI in 7/19 with EF 28%, significant viability noted. RHC in 7/19 showed mildly elevated filling pressure and low cardiac output. Now s/p CABG in 9/19. NYHA class II symptoms. Volume stable to dry. No longer wearing LifeVest. - Continue Entresto 49/51 bid.  - Continue Coreg 12.5 mg bid. No HR room to increase today - Continue spironolactone 25 mg daily.  - Decrease lasix to 20 mg daily. He can take additional 20 mg PRN. BMET today.  - Repeat echo in December to evaluate for ICD.  3. COPD: He has quit smoking since 6/19. No change 4. Hyperlipidemia: Good LDL (28) in 7/19.   Keep appointment 12/9 with Dr Aundra Dubin with echo Decrease lasix to 20 mg daily. BMET today  Georgiana Shore 05/08/2018  Greater than 50% of the 25 minute visit was spent in counseling/coordination of care regarding disease state education, salt/fluid restriction, sliding scale diuretics, and medication compliance.

## 2018-05-08 ENCOUNTER — Encounter (HOSPITAL_COMMUNITY): Payer: Self-pay

## 2018-05-08 ENCOUNTER — Ambulatory Visit (HOSPITAL_COMMUNITY)
Admission: RE | Admit: 2018-05-08 | Discharge: 2018-05-08 | Disposition: A | Discharge status: Home / Self Care | Payer: Medicare Other | Source: Ambulatory Visit | Attending: Cardiology | Admitting: Cardiology

## 2018-05-08 ENCOUNTER — Other Ambulatory Visit: Payer: Self-pay

## 2018-05-08 VITALS — BP 110/70 | HR 64 | Wt 186.4 lb

## 2018-05-08 DIAGNOSIS — Z79899 Other long term (current) drug therapy: Secondary | ICD-10-CM | POA: Insufficient documentation

## 2018-05-08 DIAGNOSIS — J449 Chronic obstructive pulmonary disease, unspecified: Secondary | ICD-10-CM | POA: Diagnosis not present

## 2018-05-08 DIAGNOSIS — Z87891 Personal history of nicotine dependence: Secondary | ICD-10-CM | POA: Diagnosis not present

## 2018-05-08 DIAGNOSIS — Z8249 Family history of ischemic heart disease and other diseases of the circulatory system: Secondary | ICD-10-CM | POA: Diagnosis not present

## 2018-05-08 DIAGNOSIS — Z7982 Long term (current) use of aspirin: Secondary | ICD-10-CM | POA: Insufficient documentation

## 2018-05-08 DIAGNOSIS — Z951 Presence of aortocoronary bypass graft: Secondary | ICD-10-CM | POA: Insufficient documentation

## 2018-05-08 DIAGNOSIS — I5022 Chronic systolic (congestive) heart failure: Secondary | ICD-10-CM

## 2018-05-08 DIAGNOSIS — E785 Hyperlipidemia, unspecified: Secondary | ICD-10-CM | POA: Diagnosis not present

## 2018-05-08 DIAGNOSIS — I252 Old myocardial infarction: Secondary | ICD-10-CM | POA: Insufficient documentation

## 2018-05-08 DIAGNOSIS — I255 Ischemic cardiomyopathy: Secondary | ICD-10-CM | POA: Insufficient documentation

## 2018-05-08 DIAGNOSIS — I2510 Atherosclerotic heart disease of native coronary artery without angina pectoris: Secondary | ICD-10-CM | POA: Insufficient documentation

## 2018-05-08 LAB — BASIC METABOLIC PANEL
Anion gap: 9 (ref 5–15)
BUN: 29 mg/dL — ABNORMAL HIGH (ref 8–23)
CO2: 26 mmol/L (ref 22–32)
Calcium: 8.9 mg/dL (ref 8.9–10.3)
Chloride: 104 mmol/L (ref 98–111)
Creatinine, Ser: 1.31 mg/dL — ABNORMAL HIGH (ref 0.61–1.24)
GFR calc Af Amer: >60 mL/min (ref >60)
GFR calc non Af Amer: 52 mL/min — ABNORMAL LOW (ref >60)
Glucose, Bld: 88 mg/dL (ref 70–99)
Potassium: 4.4 mmol/L (ref 3.5–5.1)
Sodium: 139 mmol/L (ref 135–145)

## 2018-05-08 MED ORDER — FUROSEMIDE 20 MG PO TABS: ORAL_TABLET | ORAL | 3 refills | Status: DC

## 2018-05-08 NOTE — Patient Instructions (Signed)
DECREASE Lasix to 20 mg daily, one tab daily You may take an additional as needed in the afternoon as needed for swelling/weight gain  Labs today We will only contact you if something comes back abnormal or we need to make some changes. Otherwise no news is good news!   Keep your follow up as scheduled with Dr Aundra Dubin in 4 weeks  Do the following things EVERYDAY: 1) Weigh yourself in the morning before breakfast. Write it down and keep it in a log. 2) Take your medicines as prescribed 3) Eat low salt foods-Limit salt (sodium) to 2000 mg per day.  4) Stay as active as you can everyday 5) Limit all fluids for the day to less than 2 liters

## 2018-05-09 DIAGNOSIS — H26491 Other secondary cataract, right eye: Secondary | ICD-10-CM | POA: Diagnosis not present

## 2018-05-09 DIAGNOSIS — H35343 Macular cyst, hole, or pseudohole, bilateral: Secondary | ICD-10-CM | POA: Diagnosis not present

## 2018-05-09 DIAGNOSIS — H524 Presbyopia: Secondary | ICD-10-CM | POA: Diagnosis not present

## 2018-05-24 ENCOUNTER — Encounter: Payer: Self-pay | Admitting: Cardiothoracic Surgery

## 2018-05-24 ENCOUNTER — Ambulatory Visit (INDEPENDENT_AMBULATORY_CARE_PROVIDER_SITE_OTHER): Payer: Self-pay | Admitting: Cardiothoracic Surgery

## 2018-05-24 ENCOUNTER — Other Ambulatory Visit: Payer: Self-pay

## 2018-05-24 VITALS — BP 124/62 | HR 70 | Resp 18 | Ht 69.0 in | Wt 188.0 lb

## 2018-05-24 DIAGNOSIS — Z951 Presence of aortocoronary bypass graft: Secondary | ICD-10-CM

## 2018-05-24 NOTE — Progress Notes (Signed)
PCP is System, Pcp Not In Referring Provider is Larey Dresser, MD  Chief Complaint  Patient presents with  . Routine Post Op    f/u, s/p CABG x4 03/16/18    HPI: Final postop visit after multivessel CABG for ischemic cardiomyopathy Patient is doing well comes of angina or heart failure Surgical incisions well-healed Last chest x-ray clear Patient ready to start cardiac rehab in progress with activities.  Sternal precautions are lifted as of mid December.  Past Medical History:  Diagnosis Date  . Childhood asthma   . Coronary artery disease   . Hay fever   . Hypertension   . Macular hole of left eye 12/23/2011  . Macular hole, right eye 03/05/2014  . Myocardial infarction [facility])    June 2019  . Neuromuscular disorder (HCC)    numbness in feet  . PONV (postoperative nausea and vomiting)   . Presence of external cardiac defibrillator 2019  . 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 Sinus headache    "hay fever season; not that often"  . Transverse myelitis ([place]) 2009   was treated by Dr Erling Cruz  . Umbilical hernia     Past Surgical History:  Procedure Laterality Date  . [place] VITRECTOMY WITH 20 GAUGE MVR PORT FOR MACULAR HOLE Left 03/08/2013   Procedure: 25 GAUGE PARS PLANA VITRECTOMY WITH 20 GAUGE MVR PORT FOR MACULAR HOLE;  Surgeon: Hayden Pedro, MD;  Location: [place];  Service: Ophthalmology;  Laterality: Left;  . 25 GAUGE PARS PLANA VITRECTOMY WITH 20 GAUGE MVR PORT FOR MACULAR HOLE Right 04/02/2014   Procedure: 20-25 GAUGE PARS PLANA VITRECTOMY ; MEMBRANE PEEL; HEADSCOPE LASER; SERUM PATCH; GAS/FLUID EXCHANGE ;C3F8;  Surgeon: Hayden Pedro, MD;  Location: [place];  Service: Ophthalmology;  Laterality: Right;  . CATARACT EXTRACTION W/ INTRAOCULAR LENS  IMPLANT, BILATERAL Bilateral 2014  . CORONARY ARTERY BYPASS GRAFT N/A 03/16/2018   Procedure: CORONARY ARTERY BYPASS GRAFTING (CABG) x 4, LIMA-LAD, SVG-RAMUS, SVG-PD, SVG-DIAG, USING LEFT  INTERNAL MAMMARY ARTERY AND RIGHT GREATER SAPHENOUS VEIN HARVESTED ENDOSCOPICALLY;  Surgeon: Ivin Poot, MD;  Location: [place];  Service: Open Heart Surgery;  Laterality: N/A;  . EYE SURGERY    . GAS INSERTION Left 03/08/2013   Procedure: INSERTION OF GAS;  Surgeon: Hayden Pedro, MD;  Location: [place];  Service: Ophthalmology;  Laterality: Left;  C3F8  . GAS INSERTION Right 05/07/2014   Procedure: INSERTION OF GAS (C3F8);  Surgeon: Hayden Pedro, MD;  Location: [place];  Service: Ophthalmology;  Laterality: Right;  . GAS/FLUID EXCHANGE Right 05/07/2014   Procedure: GAS/FLUID EXCHANGE;  Surgeon: Hayden Pedro, MD;  Location: [place];  Service: Ophthalmology;  Laterality: Right;  . LASER PHOTO ABLATION Right 05/07/2014   Procedure: LASER PHOTO ABLATION;  Surgeon: Hayden Pedro, MD;  Location: [place];  Service: Ophthalmology;  Laterality: Right;  . PARS PLANA VITRECTOMY  01/18/2012   Procedure: PARS PLANA VITRECTOMY WITH 25 GAUGE;  Surgeon: Hayden Pedro, MD;  Location: [place];  Service: Ophthalmology;  Laterality: Left;  25g. ppv for macular hole ; Laser treatment;Serum patch and Gas Injection(C3F8)  . PARS PLANA VITRECTOMY Right 05/07/2014   Procedure: PARS PLANA VITRECTOMY WITH 25 GAUGE;  Surgeon: Hayden Pedro, MD;  Location: [place];  Service: Ophthalmology;  Laterality: Right;  . PARS PLANA VITRECTOMY W/ REPAIR OF MACULAR HOLE Right 04/02/2014  . PHOTOCOAGULATION WITH LASER Left 03/08/2013   Procedure: PHOTOCOAGULATION WITH LASER;  Surgeon: Hayden Pedro, MD;  Location: [place];  Service: Ophthalmology;  Laterality: Left;  Headscope   . REPAIR OF COMPLEX TRACTION RETINAL DETACHMENT Right 05/07/2014   Procedure: REPAIR OF COMPLEX TRACTION RETINAL DETACHMENT;  Surgeon: Hayden Pedro, MD;  Location: [place];  Service: Ophthalmology;  Laterality: Right;  . RIGHT HEART CATH N/A 01/19/2018   Procedure: RIGHT HEART CATH;  Surgeon: Larey Dresser, MD;  Location: [place] CV [facility];  Service:  Cardiovascular;  Laterality: N/A;  . SCLERAL BUCKLE Right 05/07/2014   Procedure: SCLERAL BUCKLE;  Surgeon: Hayden Pedro, MD;  Location: [place];  Service: Ophthalmology;  Laterality: Right;  . TEE WITHOUT CARDIOVERSION N/A 03/16/2018   Procedure: TRANSESOPHAGEAL ECHOCARDIOGRAM (TEE);  Surgeon: Prescott Gum, Collier Salina, MD;  Location: [place];  Service: Open Heart Surgery;  Laterality: N/A;    Family History  Problem Relation Age of Onset  . Heart disease Mother   . Cancer Mother   . Heart attack Father     Social History Social History   Tobacco Use  . Smoking status: Former Smoker    Packs/day: 2.00    Years: 52.00    Pack years: 104.00    Types: Cigarettes    Last attempt to quit: 12/18/2017    Years since quitting: 0.4  . Smokeless tobacco: Never Used  Substance Use Topics  . Alcohol use: No  . Drug use: No    Current Outpatient Medications  Medication Sig Dispense Refill  . acetaminophen (TYLENOL) 500 MG tablet Take 500 mg by mouth at bedtime.    Marland Kitchen aspirin EC 325 MG EC tablet Take 1 tablet (325 mg total) by mouth daily. 30 tablet 0  . atorvastatin (LIPITOR) 40 MG tablet Take 1 tablet (40 mg total) by mouth at bedtime. 90 tablet 3  . carvedilol (COREG) 12.5 MG tablet Take 1 tablet (12.5 mg total) by mouth 2 (two) times daily. 60 tablet 3  . furosemide (LASIX) 20 MG tablet Take 1 tablet (20 mg total) by mouth daily. May also take 1 tablet (20 mg total) at bedtime as needed for fluid. 45 tablet 3  . guaiFENesin (MUCINEX) 600 MG 12 hr tablet Take 1 tablet (600 mg total) by mouth 2 (two) times daily.    Vladimir Faster Glycol-Propyl Glycol (SYSTANE) 0.4-0.3 % SOLN Place 1 drop into both eyes 2 (two) times daily.    . sacubitril-valsartan (ENTRESTO) 49-51 MG Take 1 tablet by mouth 2 (two) times daily. 60 tablet 3  . spironolactone (ALDACTONE) 25 MG tablet Take 1 tablet (25 mg total) by mouth daily. 30 tablet 3   No current facility-administered medications for this visit.     Allergies   Allergen Reactions  . Fluorescein Hives and Itching  . Ivp Dye [Iodinated Diagnostic Agents] Hives, Itching and Other (See Comments)    Face got splotchy.    . Typhoid Vaccines Swelling  . Yellow Dye Hives and Itching    Dye in eye test  . Codeine Other (See Comments)    Puts me to sleep    Review of Systems  Weight stable No ankle edema Minimal post surgical pain  BP 124/62 (BP Location: Left Arm, Patient Position: Sitting, Cuff Size: Normal)   Pulse 70   Resp 18   Ht 5\' 9"  (1.753 m)   Wt 188 lb (85.3 kg)   SpO2 94% Comment: RA  BMI 27.76 kg/m  Physical Exam      Exam    General- alert and  comfortable    Neck- no JVD, no cervical adenopathy palpable, no carotid bruit   Lungs- clear without rales, wheezes   Cor- regular rate and rhythm, no murmur , gallop   Abdomen- soft, non-tender   Extremities - warm, non-tender, minimal edema   Neuro- oriented, appropriate, no focal weakness   Diagnostic Tests: None  Impression: Excellent recovery after multivessel CABG for ischemic cardiomyopathy  Plan: Importance of heart healthy lifestyle and diet reviewed with patient.  He will remain on his current medications.  He will return as needed.   Len Childs, MD [facility] [phone]

## 2018-05-30 ENCOUNTER — Telehealth (HOSPITAL_COMMUNITY): Payer: Self-pay

## 2018-05-30 NOTE — Progress Notes (Signed)
Robert [name] 77 y.o. male DOB 1941-03-07 MRN [id number]       Nutrition  No diagnosis found. Past Medical History:  Diagnosis Date  . Childhood asthma   . Coronary artery disease   . Hay fever   . Hypertension   . Macular hole of left eye 12/23/2011  . Macular hole, right eye 03/05/2014  . Myocardial infarction [facility])    June 2019  . Neuromuscular disorder (HCC)    numbness in feet  . PONV (postoperative nausea and vomiting)   . Presence of external cardiac defibrillator 2019  . 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 Sinus headache    "hay fever season; not that often"  . Transverse myelitis ([place]) 2009   was treated by Dr Erling Cruz  . Umbilical hernia    Meds reviewed.     Current Outpatient Medications (Cardiovascular):  .  atorvastatin (LIPITOR) 40 MG tablet, Take 1 tablet (40 mg total) by mouth at bedtime. .  carvedilol (COREG) 12.5 MG tablet, Take 1 tablet (12.5 mg total) by mouth 2 (two) times daily. .  furosemide (LASIX) 20 MG tablet, Take 1 tablet (20 mg total) by mouth daily. May also take 1 tablet (20 mg total) at bedtime as needed for fluid. .  sacubitril-valsartan (ENTRESTO) 49-51 MG, Take 1 tablet by mouth 2 (two) times daily. Marland Kitchen  spironolactone (ALDACTONE) 25 MG tablet, Take 1 tablet (25 mg total) by mouth daily.  Current Outpatient Medications (Respiratory):  .  guaiFENesin (MUCINEX) 600 MG 12 hr tablet, Take 1 tablet (600 mg total) by mouth 2 (two) times daily.  Current Outpatient Medications (Analgesics):  .  acetaminophen (TYLENOL) 500 MG tablet, Take 500 mg by mouth at bedtime. Marland Kitchen  aspirin EC 325 MG EC tablet, Take 1 tablet (325 mg total) by mouth daily.   Current Outpatient Medications (Other):  Marland Kitchen  Polyethyl Glycol-Propyl Glycol (SYSTANE) 0.4-0.3 % SOLN, Place 1 drop into both eyes 2 (two) times daily.   HT: Ht Readings from Last 1 Encounters:  05/24/18 5\' 9"  (1.753 m)    WT: Wt Readings from Last 5 Encounters:  05/24/18 188  lb (85.3 kg)  05/08/18 186 lb 6.4 oz (84.6 kg)  04/26/18 187 lb (84.8 kg)  04/04/18 190 lb (86.2 kg)  03/25/18 194 lb 3.6 oz (88.1 kg)     BMI = 27.75    05/24/18  Current tobacco use? No       Labs:  Lipid Panel     Component Value Date/Time   CHOL 98 01/27/2018 1215   TRIG 204 (H) 01/27/2018 1215   HDL 29 (L) 01/27/2018 1215   CHOLHDL 3.4 01/27/2018 1215   VLDL 41 (H) 01/27/2018 1215   LDLCALC 28 01/27/2018 1215    Lab Results  Component Value Date   HGBA1C 7.1 (H) 03/10/2018   CBG (last 3)  No results for input(s): GLUCAP in the last 72 hours.  Nutrition Diagnosis ? Food-and nutrition-related knowledge deficit related to lack of exposure to information as related to diagnosis of: ? CVD,  possible DM d/t elevated A1C ? Overweight  related to excessive energy intake as evidenced by a BMI = 27.75     Nutrition Goal(s):  ? To be determined  Plan:  Pt to attend nutrition classes ? Nutrition I ? Nutrition II ? Portion Distortion  ? Diabetes Blitz ? Diabetes Q & A Will provide client-centered nutrition education as part of interdisciplinary care.   Monitor and evaluate progress toward nutrition  goal with team.  Laurina Bustle, MS, RD, LDN 05/30/2018 2:58 PM

## 2018-05-31 NOTE — Telephone Encounter (Signed)
Cardiac Rehab Medication Review by a Pharmacist  Does the patient feel that his/her medications are working for him/her?  yes  Has the patient been experiencing any side effects to the medications prescribed?  no  Does the patient measure his/her own blood pressure or blood glucose at home?  no   Does the patient have any problems obtaining medications due to transportation or finances?   No - but patient and his wife are anticipating that when he reached the donut hole for his insurance, Delene Loll will be very expensive and difficult to afford  Understanding of regimen: excellent Understanding of indications: excellent Potential of compliance: excellent    Pharmacist comments: Spoke with patient's wife who had excellent knowledge of patient's medication regimen.   Jackson Latino, PharmD PGY1 Pharmacy Resident Phone [phone] 05/31/2018     3:43 PM

## 2018-06-06 ENCOUNTER — Encounter (HOSPITAL_COMMUNITY)
Admission: RE | Admit: 2018-06-06 | Discharge: 2018-06-06 | Disposition: A | Discharge status: Home / Self Care | Payer: Medicare Other | Source: Ambulatory Visit | Attending: Cardiology | Admitting: Cardiology

## 2018-06-06 ENCOUNTER — Telehealth: Payer: Self-pay

## 2018-06-06 DIAGNOSIS — R05 Cough: Secondary | ICD-10-CM | POA: Diagnosis not present

## 2018-06-06 NOTE — Telephone Encounter (Signed)
Verdis Frederickson with [facility] contacted the office concerned about Mr. Roeder.  She stated he has shown up for Cardiac Rehab and stated that he had a temperature of 101.5 F with chills last night.  Verdis Frederickson stated that his temperature at Cardiac Rehab was 97.8 F.  Incision sites look well per Verdis Frederickson.  She stated that he also was wheezing and had a productive cough.  I advised Verdis Frederickson to contact Mr. Maffett's Cardiologist, Dr. Aundra Dubin regarding his wheezing and cough.  She acknowledged receipt.  I also stated that if he has any changes with his incision sites, like signs of infection (redness, red streaks, pus, odor) from his incision sites he needed to contact our office.  She acknowledged receipt and thanked me for the return call.

## 2018-06-06 NOTE — Progress Notes (Signed)
CHEY CHO 77 y.o. male DOB: 1940/07/20 MRN: [id number]      Nutrition Note  No diagnosis found. Past Medical History:  Diagnosis Date  . Childhood asthma   . Coronary artery disease   . Hay fever   . Hypertension   . Macular hole of left eye 12/23/2011  . Macular hole, right eye 03/05/2014  . Myocardial infarction [facility])    June 2019  . Neuromuscular disorder (HCC)    numbness in feet  . PONV (postoperative nausea and vomiting)   . Presence of external cardiac defibrillator 2019  . 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 Sinus headache    "hay fever season; not that often"  . Transverse myelitis ([place]) 2009   was treated by Dr Erling Cruz  . Umbilical hernia    Meds reviewed.    Current Outpatient Medications (Cardiovascular):  .  atorvastatin (LIPITOR) 40 MG tablet, Take 1 tablet (40 mg total) by mouth at bedtime. .  carvedilol (COREG) 12.5 MG tablet, Take 1 tablet (12.5 mg total) by mouth 2 (two) times daily. .  furosemide (LASIX) 20 MG tablet, Take 1 tablet (20 mg total) by mouth daily. May also take 1 tablet (20 mg total) at bedtime as needed for fluid. .  sacubitril-valsartan (ENTRESTO) 49-51 MG, Take 1 tablet by mouth 2 (two) times daily. Marland Kitchen  spironolactone (ALDACTONE) 25 MG tablet, Take 1 tablet (25 mg total) by mouth daily.  Current Outpatient Medications (Respiratory):  .  guaiFENesin (MUCINEX) 600 MG 12 hr tablet, Take 1 tablet (600 mg total) by mouth 2 (two) times daily.  Current Outpatient Medications (Analgesics):  .  acetaminophen (TYLENOL) 500 MG tablet, Take 500 mg by mouth at bedtime. Marland Kitchen  aspirin EC 325 MG EC tablet, Take 1 tablet (325 mg total) by mouth daily.   Current Outpatient Medications (Other):  Marland Kitchen  Polyethyl Glycol-Propyl Glycol (SYSTANE) 0.4-0.3 % SOLN, Place 1 drop into both eyes 2 (two) times daily.   HT: Ht Readings from Last 1 Encounters:  05/24/18 5\' 9"  (1.753 m)    WT: Wt Readings from Last 5 Encounters:  05/24/18  188 lb (85.3 kg)  05/08/18 186 lb 6.4 oz (84.6 kg)  04/26/18 187 lb (84.8 kg)  04/04/18 190 lb (86.2 kg)  03/25/18 194 lb 3.6 oz (88.1 kg)      Current tobacco use? No  Labs:  Lipid Panel     Component Value Date/Time   CHOL 98 01/27/2018 1215   TRIG 204 (H) 01/27/2018 1215   HDL 29 (L) 01/27/2018 1215   CHOLHDL 3.4 01/27/2018 1215   VLDL 41 (H) 01/27/2018 1215   LDLCALC 28 01/27/2018 1215    Lab Results  Component Value Date   HGBA1C 7.1 (H) 03/10/2018   CBG (last 3)  No results for input(s): GLUCAP in the last 72 hours.  Nutrition Note Spoke with pt. Nutrition plan and goals reviewed with pt. Pt is following Step 2 of the Therapeutic Lifestyle Changes diet. Pt wants to continue to eat heart healthy. Heart healthy eating tips reviewed (label reading, how to build a healthy plate, portion sizes, eating frequently across the day).  Pt may have T2DM, last A1c indicates blood glucose elevated at 7.1 (03/10/18). Pt may need secondary A1C test to confirm. Pt with dx of HTN, CAD. Per discussion, pt does not use canned/convenience foods often. Pt does not add salt to food. Pt does not eat out frequently. Since having his CABG pt has limited his fast  food to 1x a week (from eating out every day), has limited his ham biscuits to 1x a month, and has started reading nutrition labels to check sodium levels in foods. Praised pt for these changes and encouraged him to keep up the great work. Pt expressed understanding of the information reviewed. Pt aware of nutrition education classes offered and would like to attend nutrition classes.  Nutrition Diagnosis ? Food-and nutrition-related knowledge deficit related to lack of exposure to information as related to diagnosis of: ? CVD   Nutrition Intervention ? Pt's individual nutrition plan and goals reviewed with pt. ? Pt given handouts for: ? Nutrition I class ? Nutrition II class   Nutrition Goal(s):  ? Pt to identify and limit food sources of  saturated fat, trans fat, refined carbohydrates and sodium ? Pt to eat a variety of non-starchy vegetables. ? Pt to continue to cut down on beef or pork high in saturated fat. ? Improved blood glucose control as evidenced by pt's A1c trending from 7.1 toward less than 7.0. ? Pt able to name foods that affect blood glucose.  Plan:  ? Pt to attend nutrition classes ? Nutrition I ? Nutrition II ? Portion Distortion  ? Diabetes Blitz ? Diabetes Q & Ae determined ? Will provide client-centered nutrition education as part of interdisciplinary care ? Monitor and evaluate progress toward nutrition goal with team.   Laurina Bustle, MS, RD, LDN 06/06/2018 1:48 PM

## 2018-06-06 NOTE — Progress Notes (Signed)
Patient presents for orientation for phase 2 cardiac rehab orientation this afternoon. Robert Robert [name] reports that he had a chills and a fever of 101.5 last night. Robert Robert [name] denies feeling bad currently but does have a congested productive cough of white secretions. Robert Robert [name] says he has had an intermittent cough since his open heart surgery in September.Temperature 97.8 kg. Blood pressure 108/72. Heart rate 72. Telemetry rhythm  Sinuswith an inverted t wave. This has been previousl. Upon assessment patient does have scattered expiratory wheezes to his left upper posterior lung fields. Right post fields clear. Dr Vantright's office called and notified. I spoke with Caryl Pina who said to follow up with Dr Claris Gladden office since Robert Gee does not have any drainage or redness from his surgical incisions. I called the heart failure clinic and left a message for a call back. I  Paged Lillia Mountain NP. Caryl Pina recommended that Robert [name] go to the urgent care for evaluation of his cough as he does not have a primary care physician. Robert [name] was instructed to go the Urgent care for evaluation. Patient states understanding. Robert [name] did not complete orientation today or his walk test due to his recent fever and cough. Will reschedule for orientation and walk test at a later date and time. Robert [name] had no acute complaints upon exit from cardiac rehab.Barnet Pall, RN,BSN 06/06/2018 3:14 PM

## 2018-06-07 ENCOUNTER — Telehealth (HOSPITAL_COMMUNITY): Payer: Self-pay | Admitting: *Deleted

## 2018-06-07 DIAGNOSIS — J189 Pneumonia, unspecified organism: Secondary | ICD-10-CM | POA: Diagnosis not present

## 2018-06-12 ENCOUNTER — Encounter (HOSPITAL_COMMUNITY): Payer: Self-pay | Admitting: Cardiology

## 2018-06-12 ENCOUNTER — Telehealth (HOSPITAL_COMMUNITY): Payer: Self-pay

## 2018-06-12 ENCOUNTER — Ambulatory Visit (HOSPITAL_BASED_OUTPATIENT_CLINIC_OR_DEPARTMENT_OTHER)
Admission: RE | Admit: 2018-06-12 | Discharge: 2018-06-12 | Disposition: A | Discharge status: Home / Self Care | Payer: Medicare Other | Source: Ambulatory Visit | Attending: Cardiology | Admitting: Cardiology

## 2018-06-12 ENCOUNTER — Ambulatory Visit (HOSPITAL_COMMUNITY)
Admission: RE | Admit: 2018-06-12 | Discharge: 2018-06-12 | Disposition: A | Discharge status: Home / Self Care | Payer: Medicare Other | Source: Ambulatory Visit | Attending: Cardiology | Admitting: Cardiology

## 2018-06-12 VITALS — BP 136/60 | HR 71 | Wt 195.8 lb

## 2018-06-12 DIAGNOSIS — Z87891 Personal history of nicotine dependence: Secondary | ICD-10-CM | POA: Insufficient documentation

## 2018-06-12 DIAGNOSIS — I2510 Atherosclerotic heart disease of native coronary artery without angina pectoris: Secondary | ICD-10-CM

## 2018-06-12 DIAGNOSIS — R29898 Other symptoms and signs involving the musculoskeletal system: Secondary | ICD-10-CM | POA: Diagnosis not present

## 2018-06-12 DIAGNOSIS — I255 Ischemic cardiomyopathy: Secondary | ICD-10-CM | POA: Diagnosis not present

## 2018-06-12 DIAGNOSIS — I5022 Chronic systolic (congestive) heart failure: Secondary | ICD-10-CM | POA: Diagnosis not present

## 2018-06-12 DIAGNOSIS — Z951 Presence of aortocoronary bypass graft: Secondary | ICD-10-CM | POA: Insufficient documentation

## 2018-06-12 DIAGNOSIS — I110 Hypertensive heart disease with heart failure: Secondary | ICD-10-CM | POA: Diagnosis not present

## 2018-06-12 DIAGNOSIS — Z79899 Other long term (current) drug therapy: Secondary | ICD-10-CM | POA: Diagnosis not present

## 2018-06-12 DIAGNOSIS — E785 Hyperlipidemia, unspecified: Secondary | ICD-10-CM | POA: Diagnosis not present

## 2018-06-12 DIAGNOSIS — Z8249 Family history of ischemic heart disease and other diseases of the circulatory system: Secondary | ICD-10-CM | POA: Diagnosis not present

## 2018-06-12 DIAGNOSIS — Z7982 Long term (current) use of aspirin: Secondary | ICD-10-CM | POA: Diagnosis not present

## 2018-06-12 DIAGNOSIS — J449 Chronic obstructive pulmonary disease, unspecified: Secondary | ICD-10-CM | POA: Diagnosis not present

## 2018-06-12 DIAGNOSIS — I252 Old myocardial infarction: Secondary | ICD-10-CM | POA: Diagnosis not present

## 2018-06-12 LAB — BASIC METABOLIC PANEL
Anion gap: 12 (ref 5–15)
BUN: 26 mg/dL — ABNORMAL HIGH (ref 8–23)
CO2: 27 mmol/L (ref 22–32)
Calcium: 8.7 mg/dL — ABNORMAL LOW (ref 8.9–10.3)
Chloride: 99 mmol/L (ref 98–111)
Creatinine, Ser: 1.32 mg/dL — ABNORMAL HIGH (ref 0.61–1.24)
GFR calc Af Amer: >60 mL/min (ref >60)
GFR calc non Af Amer: 52 mL/min — ABNORMAL LOW (ref >60)
Glucose, Bld: 119 mg/dL — ABNORMAL HIGH (ref 70–99)
Potassium: 4 mmol/L (ref 3.5–5.1)
Sodium: 138 mmol/L (ref 135–145)

## 2018-06-12 MED ORDER — SACUBITRIL-VALSARTAN 97-103 MG PO TABS: 1.0000 | ORAL_TABLET | Freq: Two times a day (BID) | ORAL | 6 refills | Status: DC

## 2018-06-12 NOTE — Telephone Encounter (Signed)
error 

## 2018-06-12 NOTE — Patient Instructions (Addendum)
INCREASE Entresto to 97/103mg  (1 tab) twice a day.  DISCONTINUE Lasix  Labs today We will only contact you if something comes back abnormal or we need to make some changes. Otherwise no news is good news!  Repeat labs in 10 days  Your physician recommends that you schedule a follow-up appointment in: 3 months with Dr. Aundra Dubin

## 2018-06-12 NOTE — Progress Notes (Signed)
  Echocardiogram 2D Echocardiogram has been performed.  Darlina Sicilian M 06/12/2018, 11:28 AM

## 2018-06-12 NOTE — Progress Notes (Signed)
[facility] Cardiology: Dr. Aundra Dubin  77 y.o. with history of CAD and ischemic cardiomyopathy presents for followup of CHF. Patient had no cardiac history prior to 6/19. However, since around 12/18, he had noted exertional dyspnea.  In 6[place]19, [place]  He developed very severe dyspnea and went to the hospital in [place] where he was found to have NSTEMI.  LHC was done, showing chronic occlusions of the LAD and RCA with collaterals.  Echo showed EF 25-30%.  No intervention, he was eventually discharged to come home.  He has been seen by Dr. Prescott Gum for CABG evaluation.  Cardiac MRI in 6/19 showed EF 28% with substantial viability.  RHC was done in 7/19, showing mildly elevated filling pressures but low cardiac output, with CI 1.7.  After RHC, I increased his Lasix and added digoxin.    Of note, he has quit smoking since 6/19.   He was admitted for CABG in 9/19.  Volume overload post-op, treated with diuresis.    Echo was done today and reviewed.  EF 40%, mid anteroseptal and mid inferoseptal akinesis.   Mr Consalvo had PNA last week with cough and CXR infiltrate. He has now completed abx and feels much better.  Cough improved.  He has otherwise been doing well.  No dyspnea except when he loads wood for his woodstove.  No dyspnea walking on flat ground.  No chest pain.  No orthopnea/PND.  Working part-time. He has started cardiac rehab.    Labs (7/19): hgb 14.1, K 4.1, creatinine 1.26, LDL 28 Labs (9/19): K 3.7, creatinine 1.09, LFTs normal, hgb 12.6 Labs (11/19): K 4.4, creatinine 1.31  PMH: 1. H/o retinal detachment 2. H/o transverse myelitis (remote) 3. CAD: NSTEMI 6/19 in [place]. - LHC (6/19): Chronic total occlusion of LAD and chronic total occlusion of RCA with collaterals.  - CABG (9/19): LIMA-LAD, SVG-D, SVG-ramus, SVG-PDA.  4. Chronic systolic CHF: Ischemic cardiomyopathy.   - Echo (6/19): LV moderately dilated, EF 25-30%, mild MR.  - Cardiac MRI  (6/19): EF 28%, significant viability noted.  - RHC (7/19): mean RA 8, PA 40/16 mean 26, mean PCWP 23, CI 1.7, PVR 0.83 WU - Echo (12/19): EF 40%, mid inferoseptal and mid anteroseptal akinesis, inferior hypokinesis, normal RV size and systolic function.  5. COPD: PFTs (9/19) with moderate obstruction.   SH: Married, lives in [place].  He quit smoking in 6/19.    Family History  Problem Relation Age of Onset  . Heart disease Mother   . Cancer Mother   . Heart attack Father    ROS: All systems reviewed and negative except as per HPI.   Current Outpatient Medications  Medication Sig Dispense Refill  . acetaminophen (TYLENOL) 500 MG tablet Take 500 mg by mouth at bedtime.    Marland Kitchen aspirin EC 325 MG EC tablet Take 1 tablet (325 mg total) by mouth daily. 30 tablet 0  . atorvastatin (LIPITOR) 40 MG tablet Take 1 tablet (40 mg total) by mouth at bedtime. 90 tablet 3  . benzonatate (TESSALON) 200 MG capsule   0  . carvedilol (COREG) 12.5 MG tablet Take 1 tablet (12.5 mg total) by mouth 2 (two) times daily. 60 tablet 3  . guaiFENesin (MUCINEX) 600 MG 12 hr tablet Take 1 tablet (600 mg total) by mouth 2 (two) times daily.    . methylPREDNISolone (MEDROL DOSEPAK) 4 MG TBPK tablet FPD  0  . Polyethyl Glycol-Propyl Glycol (SYSTANE) 0.4-0.3 % SOLN Place 1 drop  into both eyes 2 (two) times daily.    Marland Kitchen spironolactone (ALDACTONE) 25 MG tablet Take 1 tablet (25 mg total) by mouth daily. 30 tablet 3  . VENTOLIN HFA 108 (90 Base) MCG/ACT inhaler INHALE 2 PUFFS INTO THE LUNGS QID PRN  0  . sacubitril-valsartan (ENTRESTO) 97-103 MG Take 1 tablet by mouth 2 (two) times daily. 60 tablet 6   No current facility-administered medications for this encounter.    BP 136/60   Pulse 71   Wt 88.8 kg (195 lb 12.8 oz)   SpO2 96%   BMI 28.91 kg/m  General: NAD Neck: No JVD, no thyromegaly or thyroid nodule.  Lungs: Clear to auscultation bilaterally with normal respiratory effort. CV: Nondisplaced PMI.  Heart  regular S1/S2, no S3/S4, no murmur.  No peripheral edema.  No carotid bruit.  Normal pedal pulses.  Abdomen: Soft, nontender, no hepatosplenomegaly, no distention.  Skin: Intact without lesions or rashes.  Neurologic: Alert and oriented x 3.  Psych: Normal affect. Extremities: No clubbing or cyanosis.  HEENT: Normal.   Assessment/Plan: 1. CAD: LHC in 6/19 with CTOs of LAD and RCA.  Cardiac MRI showed significant myocardial viability. He is s/p CABG x 4 in 9/19.   - Continue ASA 81 and statin.  - Continue cardiac rehab.  2. Chronic systolic CHF: Ischemic cardiomyopathy.  Echo in 6/19 with EF 25-20%.  Cardiac MRI in 7/19 with EF 28%, significant viability noted. RHC in 7/19 showed mildly elevated filling pressure and low cardiac output. Now s/p CABG in 9/19.  Echo post-op in 12/19 showed EF up to 40%.  Doing well today, NYHA class II symptoms and not volume overloaded. - Increase Entresto to 97/103 bid.  BMET today and again in 10 days. With increase in [place], he can stop Lasix and just use prn.  - Continue Coreg 12.5 mg bid.   - Continue spironolactone 25 mg daily.  - EF is out of range of ICD.  3. COPD: He has quit smoking since 6/19.  4. Hyperlipidemia: Good LDL in 7/19.   Followup in 3 months.   [name] 06/12/2018

## 2018-06-14 ENCOUNTER — Ambulatory Visit (HOSPITAL_COMMUNITY): Payer: Medicare Other

## 2018-06-16 ENCOUNTER — Ambulatory Visit (HOSPITAL_COMMUNITY): Payer: Medicare Other

## 2018-06-19 ENCOUNTER — Ambulatory Visit (HOSPITAL_COMMUNITY): Payer: Medicare Other

## 2018-06-21 ENCOUNTER — Ambulatory Visit (HOSPITAL_COMMUNITY): Payer: Medicare Other

## 2018-06-22 ENCOUNTER — Ambulatory Visit (HOSPITAL_COMMUNITY)
Admission: RE | Admit: 2018-06-22 | Discharge: 2018-06-22 | Disposition: A | Discharge status: Home / Self Care | Payer: Medicare Other | Source: Ambulatory Visit | Attending: Internal Medicine | Admitting: Internal Medicine

## 2018-06-22 DIAGNOSIS — I5022 Chronic systolic (congestive) heart failure: Secondary | ICD-10-CM | POA: Diagnosis not present

## 2018-06-22 LAB — BASIC METABOLIC PANEL
Anion gap: 10 (ref 5–15)
BUN: 15 mg/dL (ref 8–23)
CO2: 26 mmol/L (ref 22–32)
Calcium: 8.7 mg/dL — ABNORMAL LOW (ref 8.9–10.3)
Chloride: 102 mmol/L (ref 98–111)
Creatinine, Ser: 1.19 mg/dL (ref 0.61–1.24)
GFR calc Af Amer: >60 mL/min (ref >60)
GFR calc non Af Amer: 59 mL/min — ABNORMAL LOW (ref >60)
Glucose, Bld: 85 mg/dL (ref 70–99)
Potassium: 4.4 mmol/L (ref 3.5–5.1)
Sodium: 138 mmol/L (ref 135–145)

## 2018-06-23 ENCOUNTER — Ambulatory Visit (HOSPITAL_COMMUNITY): Payer: Medicare Other

## 2018-06-26 ENCOUNTER — Ambulatory Visit (HOSPITAL_COMMUNITY): Payer: Medicare Other

## 2018-06-30 ENCOUNTER — Ambulatory Visit (HOSPITAL_COMMUNITY): Payer: Medicare Other

## 2018-07-03 ENCOUNTER — Ambulatory Visit (HOSPITAL_COMMUNITY): Payer: Medicare Other

## 2018-07-07 ENCOUNTER — Ambulatory Visit (HOSPITAL_COMMUNITY): Payer: Medicare Other

## 2018-07-10 ENCOUNTER — Ambulatory Visit (HOSPITAL_COMMUNITY): Payer: Medicare Other

## 2018-07-11 ENCOUNTER — Telehealth (HOSPITAL_COMMUNITY): Payer: Self-pay

## 2018-07-11 NOTE — Telephone Encounter (Signed)
Called pt to reschedule for CR, pt will come in for orientation 08/22/2018 @ 130PM and attend the 11:15AM class.

## 2018-07-12 ENCOUNTER — Ambulatory Visit (HOSPITAL_COMMUNITY): Payer: Medicare Other

## 2018-07-14 ENCOUNTER — Ambulatory Visit (HOSPITAL_COMMUNITY): Payer: Medicare Other

## 2018-07-17 ENCOUNTER — Ambulatory Visit (HOSPITAL_COMMUNITY): Payer: Medicare Other

## 2018-07-17 DIAGNOSIS — Z6829 Body mass index (BMI) 29.0-29.9, adult: Secondary | ICD-10-CM | POA: Diagnosis not present

## 2018-07-17 DIAGNOSIS — I739 Peripheral vascular disease, unspecified: Secondary | ICD-10-CM | POA: Diagnosis not present

## 2018-07-17 DIAGNOSIS — E7849 Other hyperlipidemia: Secondary | ICD-10-CM | POA: Diagnosis not present

## 2018-07-17 DIAGNOSIS — J449 Chronic obstructive pulmonary disease, unspecified: Secondary | ICD-10-CM | POA: Diagnosis not present

## 2018-07-17 DIAGNOSIS — I5022 Chronic systolic (congestive) heart failure: Secondary | ICD-10-CM | POA: Diagnosis not present

## 2018-07-17 DIAGNOSIS — I25810 Atherosclerosis of coronary artery bypass graft(s) without angina pectoris: Secondary | ICD-10-CM | POA: Diagnosis not present

## 2018-07-17 DIAGNOSIS — I779 Disorder of arteries and arterioles, unspecified: Secondary | ICD-10-CM | POA: Diagnosis not present

## 2018-07-17 DIAGNOSIS — I255 Ischemic cardiomyopathy: Secondary | ICD-10-CM | POA: Diagnosis not present

## 2018-07-17 DIAGNOSIS — N183 Chronic kidney disease, stage 3 (moderate): Secondary | ICD-10-CM | POA: Diagnosis not present

## 2018-07-17 DIAGNOSIS — Z1389 Encounter for screening for other disorder: Secondary | ICD-10-CM | POA: Diagnosis not present

## 2018-07-19 ENCOUNTER — Ambulatory Visit (HOSPITAL_COMMUNITY): Payer: Medicare Other

## 2018-07-21 ENCOUNTER — Ambulatory Visit (HOSPITAL_COMMUNITY): Payer: Medicare Other

## 2018-07-24 ENCOUNTER — Ambulatory Visit (HOSPITAL_COMMUNITY): Payer: Medicare Other

## 2018-07-26 ENCOUNTER — Ambulatory Visit (HOSPITAL_COMMUNITY): Payer: Medicare Other

## 2018-07-27 ENCOUNTER — Other Ambulatory Visit (HOSPITAL_COMMUNITY): Payer: Self-pay | Admitting: Cardiology

## 2018-07-28 ENCOUNTER — Ambulatory Visit (HOSPITAL_COMMUNITY): Payer: Medicare Other

## 2018-07-31 ENCOUNTER — Ambulatory Visit (HOSPITAL_COMMUNITY): Payer: Medicare Other

## 2018-08-02 ENCOUNTER — Ambulatory Visit (HOSPITAL_COMMUNITY): Payer: Medicare Other

## 2018-08-04 ENCOUNTER — Ambulatory Visit (HOSPITAL_COMMUNITY): Payer: Medicare Other

## 2018-08-07 ENCOUNTER — Ambulatory Visit (HOSPITAL_COMMUNITY): Payer: Medicare Other

## 2018-08-09 ENCOUNTER — Ambulatory Visit (HOSPITAL_COMMUNITY): Payer: Medicare Other

## 2018-08-11 ENCOUNTER — Ambulatory Visit (HOSPITAL_COMMUNITY): Payer: Medicare Other

## 2018-08-14 ENCOUNTER — Ambulatory Visit (HOSPITAL_COMMUNITY): Payer: Medicare Other

## 2018-08-15 DIAGNOSIS — J449 Chronic obstructive pulmonary disease, unspecified: Secondary | ICD-10-CM | POA: Diagnosis not present

## 2018-08-16 ENCOUNTER — Telehealth (HOSPITAL_COMMUNITY): Payer: Self-pay

## 2018-08-16 ENCOUNTER — Ambulatory Visit (HOSPITAL_COMMUNITY): Payer: Medicare Other

## 2018-08-16 NOTE — Telephone Encounter (Signed)
Cardiac Rehab Medication Review by a Pharmacist  Does the patient  feel that his/her medications are working for him/her?  yes  Has the patient been experiencing any side effects to the medications prescribed?  no  Does the patient measure his/her own blood pressure or blood glucose at home?  no   Does the patient have any problems obtaining medications due to transportation or finances?   no  Understanding of regimen: good Understanding of indications: fair Potential of compliance: good    Pharmacist comments: Pt states paid deductible this year for prescriptions. Pt reports medications from list he carries.     Robert [name] [facility] 08/16/2018 11:07 AM

## 2018-08-18 ENCOUNTER — Ambulatory Visit (HOSPITAL_COMMUNITY): Payer: Medicare Other

## 2018-08-18 NOTE — Progress Notes (Signed)
Robert [name] 78 y.o. male DOB 1940-08-27 MRN [id number]       Nutrition Screen Note  No diagnosis found. Past Medical History:  Diagnosis Date  . Childhood asthma   . Coronary artery disease   . Hay fever   . Hypertension   . Macular hole of left eye 12/23/2011  . Macular hole, right eye 03/05/2014  . Myocardial infarction [facility])    June 2019  . Neuromuscular disorder (HCC)    numbness in feet  . PONV (postoperative nausea and vomiting)   . Presence of external cardiac defibrillator 2019  . 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 Sinus headache    "hay fever season; not that often"  . Transverse myelitis ([place]) 2009   was treated by Dr Erling Cruz  . Umbilical hernia    Meds reviewed.    Current Outpatient Medications (Endocrine & Metabolic):  .  methylPREDNISolone (MEDROL DOSEPAK) 4 MG TBPK tablet, FPD  Current Outpatient Medications (Cardiovascular):  .  atorvastatin (LIPITOR) 40 MG tablet, Take 1 tablet (40 mg total) by mouth at bedtime. .  carvedilol (COREG) 12.5 MG tablet, TAKE 1 TABLET BY MOUTH TWICE DAILY .  sacubitril-valsartan (ENTRESTO) 97-103 MG, Take 1 tablet by mouth 2 (two) times daily. Marland Kitchen  spironolactone (ALDACTONE) 25 MG tablet, Take 1 tablet (25 mg total) by mouth daily.  Current Outpatient Medications (Respiratory):  .  benzonatate (TESSALON) 200 MG capsule,  .  guaiFENesin (MUCINEX) 600 MG 12 hr tablet, Take 1 tablet (600 mg total) by mouth 2 (two) times daily. (Patient not taking: Reported on 08/16/2018) .  VENTOLIN HFA 108 (90 Base) MCG/ACT inhaler, INHALE 2 PUFFS INTO THE LUNGS QID PRN  Current Outpatient Medications (Analgesics):  .  acetaminophen (TYLENOL) 500 MG tablet, Take 500 mg by mouth at bedtime. Marland Kitchen  aspirin EC 325 MG EC tablet, Take 1 tablet (325 mg total) by mouth daily.   Current Outpatient Medications (Other):  Marland Kitchen  Polyethyl Glycol-Propyl Glycol (SYSTANE) 0.4-0.3 % SOLN, Place 1 drop into both eyes 2 (two) times  daily.   HT: Ht Readings from Last 1 Encounters:  05/24/18 5\' 9"  (1.753 m)    WT: Wt Readings from Last 5 Encounters:  06/12/18 195 lb 12.8 oz (88.8 kg)  05/24/18 188 lb (85.3 kg)  05/08/18 186 lb 6.4 oz (84.6 kg)  04/26/18 187 lb (84.8 kg)  04/04/18 190 lb (86.2 kg)     BMI = 27.75 05/24/18  Current tobacco use? No       Labs:  Lipid Panel     Component Value Date/Time   CHOL 98 01/27/2018 1215   TRIG 204 (H) 01/27/2018 1215   HDL 29 (L) 01/27/2018 1215   CHOLHDL 3.4 01/27/2018 1215   VLDL 41 (H) 01/27/2018 1215   LDLCALC 28 01/27/2018 1215    Lab Results  Component Value Date   HGBA1C 7.1 (H) 03/10/2018   CBG (last 3)  No results for input(s): GLUCAP in the last 72 hours.  Nutrition Diagnosis ? Food-and nutrition-related knowledge deficit related to lack of exposure to information as related to diagnosis of: ? CVD ? CHF ? Overweight  related to excessive energy intake as evidenced by a    BMI = 27.7511/20/19  Nutrition Goal(s):  ? To be determined  Plan:  Pt to attend nutrition classes ? Nutrition I ? Nutrition II ? Portion Distortion  Will provide client-centered nutrition education as part of interdisciplinary care.   Monitor and evaluate progress toward nutrition  goal with team.  Laurina Bustle, MS, RD, LDN 08/18/2018 2:18 PM

## 2018-08-21 ENCOUNTER — Ambulatory Visit (HOSPITAL_COMMUNITY): Payer: Medicare Other

## 2018-08-22 ENCOUNTER — Encounter (HOSPITAL_COMMUNITY): Payer: Self-pay

## 2018-08-22 ENCOUNTER — Encounter (HOSPITAL_COMMUNITY)
Admission: RE | Admit: 2018-08-22 | Discharge: 2018-08-22 | Disposition: A | Discharge status: Home / Self Care | Payer: Medicare Other | Source: Ambulatory Visit | Attending: Cardiology | Admitting: Cardiology

## 2018-08-22 VITALS — BP 138/80 | HR 67 | Ht 69.0 in | Wt 203.9 lb

## 2018-08-22 DIAGNOSIS — Z951 Presence of aortocoronary bypass graft: Secondary | ICD-10-CM

## 2018-08-22 NOTE — Progress Notes (Signed)
Cardiac Individual Treatment Plan  Patient Details  Name: Robert [name] MRN: [id number] Date of Birth: 1941/04/18 Referring Provider:   Flowsheet Row CARDIAC REHAB PHASE II ORIENTATION from 08/22/2018 in [place]  Referring Provider  [name], MD      Initial Encounter Date:  Flowsheet Row CARDIAC REHAB PHASE II ORIENTATION from 08/22/2018 in [facility]  Date  08/22/18      Visit Diagnosis: 03/16/18 CABG x4  Patient's Home Medications on Admission:  Current Outpatient Medications:  .  acetaminophen (TYLENOL) 500 MG tablet, Take 500 mg by mouth at bedtime., Disp: , Rfl:  .  aspirin EC 325 MG EC tablet, Take 1 tablet (325 mg total) by mouth daily., Disp: 30 tablet, Rfl: 0 .  atorvastatin (LIPITOR) 40 MG tablet, Take 1 tablet (40 mg total) by mouth at bedtime., Disp: 90 tablet, Rfl: 3 .  benzonatate (TESSALON) 200 MG capsule, , Disp: , Rfl: 0 .  carvedilol (COREG) 12.5 MG tablet, TAKE 1 TABLET BY MOUTH TWICE DAILY, Disp: 60 tablet, Rfl: 3 .  guaiFENesin (MUCINEX) 600 MG 12 hr tablet, Take 1 tablet (600 mg total) by mouth 2 (two) times daily. (Patient not taking: Reported on 08/16/2018), Disp: , Rfl:  .  methylPREDNISolone (MEDROL DOSEPAK) 4 MG TBPK tablet, FPD, Disp: , Rfl: 0 .  Polyethyl Glycol-Propyl Glycol (SYSTANE) 0.4-0.3 % SOLN, Place 1 drop into both eyes 2 (two) times daily., Disp: , Rfl:  .  sacubitril-valsartan (ENTRESTO) 97-103 MG, Take 1 tablet by mouth 2 (two) times daily., Disp: 60 tablet, Rfl: 6 .  spironolactone (ALDACTONE) 25 MG tablet, Take 1 tablet (25 mg total) by mouth daily., Disp: 30 tablet, Rfl: 3 .  VENTOLIN HFA 108 (90 Base) MCG/ACT inhaler, INHALE 2 PUFFS INTO THE LUNGS QID PRN, Disp: , Rfl: 0  Past Medical History: Past Medical History:  Diagnosis Date  . Childhood asthma   . Coronary artery disease   . Hay fever   . Hypertension   . Macular hole of left eye 12/23/2011  .  Macular hole, right eye 03/05/2014  . Myocardial infarction [facility])    June 2019  . Neuromuscular disorder (HCC)    numbness in feet  . PONV (postoperative nausea and vomiting)   . Presence of external cardiac defibrillator 2019  . 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 Sinus headache    "hay fever season; not that often"  . Transverse myelitis ([place]) 2009   was treated by Dr Erling Cruz  . Umbilical hernia     Tobacco Use: Social History   Tobacco Use  Smoking Status Former Smoker  . Packs/day: 2.00  . Years: 52.00  . Pack years: 104.00  . Types: Cigarettes  . Last attempt to quit: 12/18/2017  . Years since quitting: 0.6  Smokeless Tobacco Never Used    Labs: Recent Review Flowsheet Data    Labs for ITP Cardiac and Pulmonary Rehab Latest Ref Rng & Units 03/17/2018 03/18/2018 03/19/2018 03/20/2018 03/21/2018   Cholestrol 0 - 200 mg/dL - - - - -   LDLCALC 0 - 99 mg/dL - - - - -   HDL >40 mg/dL - - - - -   Trlycerides <150 mg/dL - - - - -   Hemoglobin A1c 4.8 - 5.6 % - - - - -   PHART 7.350 - 7.450 - - - - -   PCO2ART 32.0 - 48.0 mmHg - - - - -  HCO3 20.0 - 28.0 mmol/L - - - - -   TCO2 22 - 32 mmol/L 22 26 27  - -   ACIDBASEDEF 0.0 - 2.0 mmol/L - - - - -   O2SAT % - 67.8 86.1 53.1 65.6      Capillary Blood Glucose: Lab Results  Component Value Date   GLUCAP 143 (H) 03/25/2018   GLUCAP 94 03/25/2018   GLUCAP 110 (H) 03/24/2018   GLUCAP 104 (H) 03/24/2018   GLUCAP 118 (H) 03/24/2018     Exercise Target Goals: Exercise Program Goal: Individual exercise prescription set using results from initial 6 min walk test and THRR while considering  patient's activity barriers and safety.   Exercise Prescription Goal: Initial exercise prescription builds to 30-45 minutes a day of aerobic activity, 2-3 days per week.  Home exercise guidelines will be given to patient during program as part of exercise prescription that the participant will acknowledge.  Activity  Barriers & Risk Stratification: Activity Barriers & Cardiac Risk Stratification - 08/22/18 1344    Activity Barriers & Cardiac Risk Stratification          Activity Barriers  Other (comment)    Comments  Right knee "issues".    Cardiac Risk Stratification  High           6 Minute Walk: 6 Minute Walk    6 Minute Walk    Row Name 08/22/18 1409   Phase  Initial   Distance  1340 feet   Walk Time  6 minutes   # of Rest Breaks  0   MPH  2.54   METS  2.61   RPE  11   Perceived Dyspnea   0   VO2 Peak  9.13   Symptoms  No   Resting HR  67 bpm   Resting BP  138/80   Resting Oxygen Saturation   95 %   Exercise Oxygen Saturation  during 6 min walk  97 %   Max Ex. HR  92 bpm   Max Ex. BP  148/72   2 Minute Post BP  122/68          Oxygen Initial Assessment:   Oxygen Re-Evaluation:   Oxygen Discharge (Final Oxygen Re-Evaluation):   Initial Exercise Prescription: Initial Exercise Prescription - 08/22/18 1500    Date of Initial Exercise RX and Referring Provider          Date  08/22/18    Referring Provider  [name], MD    Expected Discharge Date  11/24/18        Bike          Level  0.8    Minutes  10    METs  2.64        NuStep          Level  2    SPM  85    Minutes  10    METs  2.5        Track          Laps  9    Minutes  10    METs  2.57        Prescription Details          Frequency (times per week)  3    Duration  Progress to 30 minutes of continuous aerobic without signs/symptoms of physical distress        Intensity          THRR 40-80% of Max Heartrate  58-116    Ratings of Perceived Exertion  11-13    Perceived Dyspnea  0-4        Progression          Progression  Continue to progress workloads to maintain intensity without signs/symptoms of physical distress.        Resistance Training          Training Prescription  Yes    Weight  3lbs    Reps  10-15           Perform Capillary Blood Glucose checks as  needed.  Exercise Prescription Changes:   Exercise Comments:   Exercise Goals and Review: Exercise Goals    Exercise Goals    Row Name 06/06/18 1547 08/22/18 1344   Increase Physical Activity  Yes  Yes   Intervention  Provide advice, education, support and counseling about physical activity/exercise needs.;Develop an individualized exercise prescription for aerobic and resistive training based on initial evaluation findings, risk stratification, comorbidities and participant's personal goals.  Provide advice, education, support and counseling about physical activity/exercise needs.;Develop an individualized exercise prescription for aerobic and resistive training based on initial evaluation findings, risk stratification, comorbidities and participant's personal goals.   Expected Outcomes  Short Term: Attend rehab on a regular basis to increase amount of physical activity.  Short Term: Attend rehab on a regular basis to increase amount of physical activity.;Long Term: Add in home exercise to make exercise part of routine and to increase amount of physical activity.;Long Term: Exercising regularly at least 3-5 days a week.   Increase Strength and Stamina  Yes  Yes   Intervention  Provide advice, education, support and counseling about physical activity/exercise needs.;Develop an individualized exercise prescription for aerobic and resistive training based on initial evaluation findings, risk stratification, comorbidities and participant's personal goals.  Provide advice, education, support and counseling about physical activity/exercise needs.;Develop an individualized exercise prescription for aerobic and resistive training based on initial evaluation findings, risk stratification, comorbidities and participant's personal goals.   Expected Outcomes  Short Term: Increase workloads from initial exercise prescription for resistance, speed, and METs.  Short Term: Increase workloads from initial exercise  prescription for resistance, speed, and METs.;Short Term: Perform resistance training exercises routinely during rehab and add in resistance training at home;Long Term: Improve cardiorespiratory fitness, muscular endurance and strength as measured by increased METs and functional capacity (6MWT)   Able to understand and use rate of perceived exertion (RPE) scale  Yes  Yes   Intervention  Provide education and explanation on how to use RPE scale  Provide education and explanation on how to use RPE scale   Expected Outcomes  Short Term: Able to use RPE daily in rehab to express subjective intensity level;Long Term:  Able to use RPE to guide intensity level when exercising independently  Short Term: Able to use RPE daily in rehab to express subjective intensity level;Long Term:  Able to use RPE to guide intensity level when exercising independently   Knowledge and understanding of Target Heart Rate Range (THRR)  Yes  Yes   Intervention  Provide education and explanation of THRR including how the numbers were predicted and where they are located for reference  Provide education and explanation of THRR including how the numbers were predicted and where they are located for reference   Expected Outcomes  Short Term: Able to state/look up THRR;Long Term: Able to use THRR to govern intensity when exercising independently;Short Term: Able to use daily as guideline for  intensity in rehab  Short Term: Able to state/look up THRR;Long Term: Able to use THRR to govern intensity when exercising independently;Short Term: Able to use daily as guideline for intensity in rehab   Able to check pulse independently  Yes  Yes   Intervention  Provide education and demonstration on how to check pulse in carotid and radial arteries.;Review the importance of being able to check your own pulse for safety during independent exercise  Provide education and demonstration on how to check pulse in carotid and radial arteries.;Review the  importance of being able to check your own pulse for safety during independent exercise   Expected Outcomes  Short Term: Able to explain why pulse checking is important during independent exercise;Long Term: Able to check pulse independently and accurately  Short Term: Able to explain why pulse checking is important during independent exercise;Long Term: Able to check pulse independently and accurately   Understanding of Exercise Prescription  Yes  Yes   Intervention  Provide education, explanation, and written materials on patient's individual exercise prescription  Provide education, explanation, and written materials on patient's individual exercise prescription   Expected Outcomes  Short Term: Able to explain program exercise prescription;Long Term: Able to explain home exercise prescription to exercise independently  Short Term: Able to explain program exercise prescription;Long Term: Able to explain home exercise prescription to exercise independently          Exercise Goals Re-Evaluation :   Discharge Exercise Prescription (Final Exercise Prescription Changes):   Nutrition:  Target Goals: Understanding of nutrition guidelines, daily intake of sodium 1500mg , cholesterol 200mg , calories 30% from fat and 7% or less from saturated fats, daily to have 5 or more servings of fruits and vegetables.  Biometrics: Pre Biometrics - 08/22/18 1334    Pre Biometrics          Height  5\' 9"  (1.753 m)    Weight  92.5 kg    Waist Circumference  43 inches    Hip Circumference  44.8 inches    Waist to Hip Ratio  0.96 %    BMI (Calculated)  30.1    Triceps Skinfold  15 mm    % Body Fat  30 %    Grip Strength  36 kg    Flexibility  0 in    Single Leg Stand  4 seconds            Nutrition Therapy Plan and Nutrition Goals: Nutrition Therapy & Goals - 06/06/18 1404    Nutrition Therapy          Diet  heart healthy, diabetic        Personal Nutrition Goals          Nutrition Goal  Pt  to identify and limit food sources of saturated fat, trans fat, refined carbohydrates and sodium    Personal Goal #2  Pt able to name foods that affect blood glucose.    Personal Goal #3  Improved blood glucose control as evidenced by pt's A1c trending from 7.1 toward less than 7.0.    Personal Goal #4  Pt to eat a variety of non-starchy vegetables        Intervention Plan          Intervention  Prescribe, educate and counsel regarding individualized specific dietary modifications aiming towards targeted core components such as weight, hypertension, lipid management, diabetes, heart failure and other comorbidities.    Expected Outcomes  Short Term Goal: Understand basic principles of dietary  content, such as calories, fat, sodium, cholesterol and nutrients.;Long Term Goal: Adherence to prescribed nutrition plan.           Nutrition Assessments: Nutrition Assessments - 06/06/18 1411    MEDFICTS Scores          Pre Score  27           Nutrition Goals Re-Evaluation:   Nutrition Goals Re-Evaluation:   Nutrition Goals Discharge (Final Nutrition Goals Re-Evaluation):   Psychosocial: Target Goals: Acknowledge presence or absence of significant depression and/or stress, maximize coping skills, provide positive support system. Participant is able to verbalize types and ability to use techniques and skills needed for reducing stress and depression.  Initial Review & Psychosocial Screening: Initial Psych Review & Screening - 08/22/18 1524    Initial Review          Current issues with  None Identified        Family Dynamics          Good Support System?  Yes   wife       Barriers          Psychosocial barriers to participate in program  There are no identifiable barriers or psychosocial needs.        Screening Interventions          Interventions  Encouraged to exercise           Quality of Life Scores: Quality of Life - 08/22/18 1423    Quality of Life           Select  Quality of Life        Quality of Life Scores          Health/Function Pre  24.1 %    Socioeconomic Pre  24.43 %    Psych/Spiritual Pre  26.79 %    Family Pre  27 %    GLOBAL Pre  25.09 %          Scores of 19 and below usually indicate a poorer quality of life in these areas.  A difference of  2-3 points is a clinically meaningful difference.  A difference of 2-3 points in the total score of the Quality of Life Index has been associated with significant improvement in overall quality of life, self-image, physical symptoms, and general health in studies assessing change in quality of life.  PHQ-9: Recent Review Flowsheet Data    Depression screen [facility] 2/9 05/24/2018   Decreased Interest 0   Down, Depressed, Hopeless 0   PHQ - 2 Score 0     Interpretation of Total Score  Total Score Depression Severity:  1-4 = Minimal depression, 5-9 = Mild depression, 10-14 = Moderate depression, 15-19 = Moderately severe depression, 20-27 = Severe depression   Psychosocial Evaluation and Intervention:   Psychosocial Re-Evaluation:   Psychosocial Discharge (Final Psychosocial Re-Evaluation):   Vocational Rehabilitation: Provide vocational rehab assistance to qualifying candidates.   Vocational Rehab Evaluation & Intervention: Vocational Rehab - 08/22/18 1422    Initial Vocational Rehab Evaluation & Intervention          Assessment shows need for Vocational Rehabilitation  No           Education: Education Goals: Education classes will be provided on a weekly basis, covering required topics. Participant will state understanding/return demonstration of topics presented.  Learning Barriers/Preferences: Learning Barriers/Preferences - 08/22/18 1346    Learning Barriers/Preferences          Learning Barriers  Sight  Learning Preferences  Skilled Demonstration;Written Material;Group Instruction;Individual Instruction           Education Topics: Count Your Pulse:   -Group instruction provided by verbal instruction, demonstration, patient participation and written materials to support subject.  Instructors address importance of being able to find your pulse and how to count your pulse when at home without a heart monitor.  Patients get hands on experience counting their pulse with staff help and individually.   Heart Attack, Angina, and Risk Factor Modification:  -Group instruction provided by verbal instruction, video, and written materials to support subject.  Instructors address signs and symptoms of angina and heart attacks.    Also discuss risk factors for heart disease and how to make changes to improve heart health risk factors.   Functional Fitness:  -Group instruction provided by verbal instruction, demonstration, patient participation, and written materials to support subject.  Instructors address safety measures for doing things around the house.  Discuss how to get up and down off the floor, how to pick things up properly, how to safely get out of a chair without assistance, and balance training.   Meditation and Mindfulness:  -Group instruction provided by verbal instruction, patient participation, and written materials to support subject.  Instructor addresses importance of mindfulness and meditation practice to help reduce stress and improve awareness.  Instructor also leads participants through a meditation exercise.    Stretching for Flexibility and Mobility:  -Group instruction provided by verbal instruction, patient participation, and written materials to support subject.  Instructors lead participants through series of stretches that are designed to increase flexibility thus improving mobility.  These stretches are additional exercise for major muscle groups that are typically performed during regular warm up and cool down.   Hands Only CPR:  -Group verbal, video, and participation provides a basic overview of AHA guidelines for  community CPR. Role-play of emergencies allow participants the opportunity to practice calling for help and chest compression technique with discussion of AED use.   Hypertension: -Group verbal and written instruction that provides a basic overview of hypertension including the most recent diagnostic guidelines, risk factor reduction with self-care instructions and medication management.    Nutrition I class: Heart Healthy Eating:  -Group instruction provided by PowerPoint slides, verbal discussion, and written materials to support subject matter. The instructor gives an explanation and review of the Therapeutic Lifestyle Changes diet recommendations, which includes a discussion on lipid goals, dietary fat, sodium, fiber, plant stanol/sterol esters, sugar, and the components of a well-balanced, healthy diet.   Nutrition II class: Lifestyle Skills:  -Group instruction provided by PowerPoint slides, verbal discussion, and written materials to support subject matter. The instructor gives an explanation and review of label reading, grocery shopping for heart health, heart healthy recipe modifications, and ways to make healthier choices when eating out.   Diabetes Question & Answer:  -Group instruction provided by PowerPoint slides, verbal discussion, and written materials to support subject matter. The instructor gives an explanation and review of diabetes co-morbidities, pre- and post-prandial blood glucose goals, pre-exercise blood glucose goals, signs, symptoms, and treatment of hypoglycemia and hyperglycemia, and foot care basics.   Diabetes Blitz:  -Group instruction provided by PowerPoint slides, verbal discussion, and written materials to support subject matter. The instructor gives an explanation and review of the physiology behind type 1 and type 2 diabetes, diabetes medications and rational behind using different medications, pre- and post-prandial blood glucose recommendations and  Hemoglobin A1c goals, diabetes diet, and exercise  including blood glucose guidelines for exercising safely.    Portion Distortion:  -Group instruction provided by PowerPoint slides, verbal discussion, written materials, and food models to support subject matter. The instructor gives an explanation of serving size versus portion size, changes in portions sizes over the last 20 years, and what consists of a serving from each food group.   Stress Management:  -Group instruction provided by verbal instruction, video, and written materials to support subject matter.  Instructors review role of stress in heart disease and how to cope with stress positively.     Exercising on Your Own:  -Group instruction provided by verbal instruction, power point, and written materials to support subject.  Instructors discuss benefits of exercise, components of exercise, frequency and intensity of exercise, and end points for exercise.  Also discuss use of nitroglycerin and activating EMS.  Review options of places to exercise outside of rehab.  Review guidelines for sex with heart disease.   Cardiac Drugs I:  -Group instruction provided by verbal instruction and written materials to support subject.  Instructor reviews cardiac drug classes: antiplatelets, anticoagulants, beta blockers, and statins.  Instructor discusses reasons, side effects, and lifestyle considerations for each drug class.   Cardiac Drugs II:  -Group instruction provided by verbal instruction and written materials to support subject.  Instructor reviews cardiac drug classes: angiotensin converting enzyme inhibitors (ACE-I), angiotensin II receptor blockers (ARBs), nitrates, and calcium channel blockers.  Instructor discusses reasons, side effects, and lifestyle considerations for each drug class.   Anatomy and Physiology of the Circulatory System:  Group verbal and written instruction and models provide basic cardiac anatomy and physiology,  with the coronary electrical and arterial systems. Review of: AMI, Angina, Valve disease, Heart Failure, Peripheral Artery Disease, Cardiac Arrhythmia, Pacemakers, and the ICD.   Other Education:  -Group or individual verbal, written, or video instructions that support the educational goals of the cardiac rehab program.   Holiday Eating Survival Tips:  -Group instruction provided by PowerPoint slides, verbal discussion, and written materials to support subject matter. The instructor gives patients tips, tricks, and techniques to help them not only survive but enjoy the holidays despite the onslaught of food that accompanies the holidays.   Knowledge Questionnaire Score: Knowledge Questionnaire Score - 08/22/18 1424    Knowledge Questionnaire Score          Pre Score  27/28           Core Components/Risk Factors/Patient Goals at Admission: Personal Goals and Risk Factors at Admission - 08/22/18 1437    Core Components/Risk Factors/Patient Goals on Admission           Weight Management  Obesity;Yes    Intervention  Weight Management: Develop a combined nutrition and exercise program designed to reach desired caloric intake, while maintaining appropriate intake of nutrient and fiber, sodium and fats, and appropriate energy expenditure required for the weight goal.;Weight Management: Provide education and appropriate resources to help participant work on and attain dietary goals.;Weight Management/Obesity: Establish reasonable short term and long term weight goals.;Obesity: Provide education and appropriate resources to help participant work on and attain dietary goals.    Admit Weight  203 lb 14.8 oz (92.5 kg)    Expected Outcomes  Long Term: Adherence to nutrition and physical activity/exercise program aimed toward attainment of established weight goal;Weight Loss: Understanding of general recommendations for a balanced deficit meal plan, which promotes 1-2 lb weight loss per week and  includes a negative energy balance of [phone] kcal/d;Short  Term: Continue to assess and modify interventions until short term weight is achieved;Understanding recommendations for meals to include 15-35% energy as protein, 25-35% energy from fat, 35-60% energy from carbohydrates, less than 200mg  of dietary cholesterol, 20-35 gm of total fiber daily;Understanding of distribution of calorie intake throughout the day with the consumption of 4-5 meals/snacks    Hypertension  Yes    Intervention  Provide education on lifestyle modifcations including regular physical activity/exercise, weight management, moderate sodium restriction and increased consumption of fresh fruit, vegetables, and low fat dairy, alcohol moderation, and smoking cessation.;Monitor prescription use compliance.    Expected Outcomes  Short Term: Continued assessment and intervention until BP is < 140/84mm HG in hypertensive participants. < 130/37mm HG in hypertensive participants with diabetes, heart failure or chronic kidney disease.;Long Term: Maintenance of blood pressure at goal levels.    Lipids  Yes    Intervention  Provide education and support for participant on nutrition & aerobic/resistive exercise along with prescribed medications to achieve LDL 70mg , HDL >40mg .    Expected Outcomes  Short Term: Participant states understanding of desired cholesterol values and is compliant with medications prescribed. Participant is following exercise prescription and nutrition guidelines.;Long Term: Cholesterol controlled with medications as prescribed, with individualized exercise RX and with personalized nutrition plan. Value goals: LDL < 70mg , HDL > 40 mg.           Core Components/Risk Factors/Patient Goals Review:    Core Components/Risk Factors/Patient Goals at Discharge (Final Review):    ITP Comments: ITP Comments    Row Name 08/22/18 1337   ITP Comments  Medical Director- Dr. Fransico Him, MD      Comments: Patient  attended orientation from 1335 to 1556 to review rules and guidelines for program. Completed 6 minute walk test, Intitial ITP, and exercise prescription.  VSS. Telemetry- sinus rhythm, NSSTT changes, occ PAC.  Asymptomatic.  Andi Hence, RN, BSN Cardiac Pulmonary Rehab

## 2018-08-23 ENCOUNTER — Ambulatory Visit (HOSPITAL_COMMUNITY): Payer: Medicare Other

## 2018-08-23 ENCOUNTER — Other Ambulatory Visit (HOSPITAL_COMMUNITY): Payer: Self-pay | Admitting: Physician Assistant

## 2018-08-23 NOTE — Progress Notes (Signed)
Robert [name] 78 y.o. male DOB: [birth year] MRN: [id number]      Nutrition Note  1. 03/16/18 CABG x4    Past Medical History:  Diagnosis Date  . Childhood asthma   . Coronary artery disease   . Hay fever   . Hypertension   . Macular hole of left eye 12/23/2011  . Macular hole, right eye 03/05/2014  . Myocardial infarction [facility])    June 2019  . Neuromuscular disorder (HCC)    numbness in feet  . PONV (postoperative nausea and vomiting)   . Presence of external cardiac defibrillator 2019  . 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 Sinus headache    "hay fever season; not that often"  . Transverse myelitis ([place]) 2009   was treated by Dr Erling Cruz  . Umbilical hernia    Meds reviewed.   Current Outpatient Medications (Endocrine & Metabolic):  .  methylPREDNISolone (MEDROL DOSEPAK) 4 MG TBPK tablet, FPD  Current Outpatient Medications (Cardiovascular):  .  atorvastatin (LIPITOR) 40 MG tablet, Take 1 tablet (40 mg total) by mouth at bedtime. .  carvedilol (COREG) 12.5 MG tablet, TAKE 1 TABLET BY MOUTH TWICE DAILY .  sacubitril-valsartan (ENTRESTO) 97-103 MG, Take 1 tablet by mouth 2 (two) times daily. Marland Kitchen  spironolactone (ALDACTONE) 25 MG tablet, Take 1 tablet (25 mg total) by mouth daily.  Current Outpatient Medications (Respiratory):  .  benzonatate (TESSALON) 200 MG capsule,  .  guaiFENesin (MUCINEX) 600 MG 12 hr tablet, Take 1 tablet (600 mg total) by mouth 2 (two) times daily. (Patient not taking: Reported on 08/16/2018) .  VENTOLIN HFA 108 (90 Base) MCG/ACT inhaler, INHALE 2 PUFFS INTO THE LUNGS QID PRN  Current Outpatient Medications (Analgesics):  .  acetaminophen (TYLENOL) 500 MG tablet, Take 500 mg by mouth at bedtime. Marland Kitchen  aspirin EC 325 MG EC tablet, Take 1 tablet (325 mg total) by mouth daily.   Current Outpatient Medications (Other):  Marland Kitchen  Polyethyl Glycol-Propyl Glycol (SYSTANE) 0.4-0.3 % SOLN, Place 1 drop into both eyes 2 (two) times  daily.   HT: Ht Readings from Last 1 Encounters:  08/22/18 5\' 9"  (1.753 m)    WT: Wt Readings from Last 5 Encounters:  08/22/18 203 lb 14.8 oz (92.5 kg)  06/12/18 195 lb 12.8 oz (88.8 kg)  05/24/18 188 lb (85.3 kg)  05/08/18 186 lb 6.4 oz (84.6 kg)  04/26/18 187 lb (84.8 kg)     Body mass index is 30.11 kg/m.   Current tobacco use? No  Labs:  Lipid Panel     Component Value Date/Time   CHOL 98 01/27/2018 1215   TRIG 204 (H) 01/27/2018 1215   HDL 29 (L) 01/27/2018 1215   CHOLHDL 3.4 01/27/2018 1215   VLDL 41 (H) 01/27/2018 1215   LDLCALC 28 01/27/2018 1215    Lab Results  Component Value Date   HGBA1C 7.1 (H) 03/10/2018   CBG (last 3)  No results for input(s): GLUCAP in the last 72 hours.   Nutrition Note Spoke with pt. Nutrition plan and goals reviewed with pt. Pt is following Step 2 of the Therapeutic Lifestyle Changes diet. Pt wants to continue to eat heart healthy, but expressed he "doesn't cook, not going to start now". Heart healthy eating tips reviewed (label reading, how to build a healthy plate, portion sizes, eating frequently across the day). Discussed with patient if no changes are made, things will stay the same. Pt may have T2DM, last A1c was elevated. Pt  may need secondary A1C test to confirm. Discussed the differences between complex and refined carbs, recommended pt replace refined carbs with complex. Reviewed the benefits swapping in complex carbs and moderating portion sizes can have on managing blood glucose with patient. Pt with dx of HTN, CAD. Per discussion, pt does not use canned / convenience foods often. Pt does not add salt to food. Pt does not eat out frequently. Since having his CABG pt has limited his fast food to 1x a week (from eating out every day), has limited his ham biscuits to 1x a month, and has started reading nutrition labels to check sodium levels in foods. Praised pt for these changes and encouraged him to keep up the great work. Pt  expressed understanding of the information reviewed. Pt aware of nutrition education classes offered and would like to attend nutrition classes.  Nutrition Diagnosis ? Food-and nutrition-related knowledge deficit related to lack of exposure to information as related to diagnosis of: ? CVD   Nutrition Intervention ? Pt's individual nutrition plan and goals reviewed with pt. ? Pt given handouts for: ? Nutrition I class ? Nutrition II class   Nutrition Goal(s):  ? Pt to identify and limit food sources of saturated fat, trans fat, refined carbohydrates and sodium ? Pt to eat a variety of non-starchy vegetables. ? Pt to continue to cut down on beef or pork high in saturated fat. ? Pt able to name foods that affect blood glucose.  Plan:  ? Pt to attend nutrition classes ? Nutrition I ? Nutrition II ? Portion Distortion  ? Diabetes Blitz ? Diabetes Q & Ae determined ? Will provide client-centered nutrition education as part of interdisciplinary care ? Monitor and evaluate progress toward nutrition goal with team.   Laurina Bustle, MS, RD, LDN 08/23/2018 1:18 PM

## 2018-08-25 ENCOUNTER — Ambulatory Visit (HOSPITAL_COMMUNITY): Payer: Medicare Other

## 2018-08-28 ENCOUNTER — Encounter (HOSPITAL_COMMUNITY)
Admission: RE | Admit: 2018-08-28 | Discharge: 2018-08-28 | Disposition: A | Discharge status: Home / Self Care | Payer: Medicare Other | Source: Ambulatory Visit | Attending: Cardiology | Admitting: Cardiology

## 2018-08-28 ENCOUNTER — Ambulatory Visit (HOSPITAL_COMMUNITY): Payer: Medicare Other

## 2018-08-28 ENCOUNTER — Other Ambulatory Visit (HOSPITAL_COMMUNITY): Payer: Self-pay | Admitting: *Deleted

## 2018-08-28 DIAGNOSIS — Z951 Presence of aortocoronary bypass graft: Secondary | ICD-10-CM

## 2018-08-28 MED ORDER — SPIRONOLACTONE 25 MG PO TABS: 25.0000 mg | ORAL_TABLET | Freq: Every day | ORAL | 3 refills | Status: DC

## 2018-08-28 NOTE — Progress Notes (Signed)
Daily Session Note  Patient Details  Name: Robert [name] MRN: [id number] Date of Birth: 06-Jan-1941 Referring Provider:     CARDIAC REHAB PHASE II ORIENTATION from 08/22/2018 in [place]  Referring Provider  [name], MD      Encounter Date: 08/28/2018  Check In: Session Check In - 08/28/18 1150      Check-In   Supervising physician immediately available to respond to emergencies  [facility] immediately available    Physician(s)  Dr. Lonny Prude    Location  [facility]    Staff Present  Barnet Pall, RN, Deland Pretty, MS, ACSM CEP, Exercise Physiologist;Brittany Durene Fruits, BS, ACSM CEP, Exercise Physiologist;Tyara Carol Ada, MS,ACSM CEP, Exercise Physiologist;Shadell Brenn Karle Starch, RN, BSN;Other   [place]   Medication changes reported      No    Fall or balance concerns reported     No    Tobacco Cessation  No Change    Warm-up and Cool-down  Performed as group-led instruction    Resistance Training Performed  Yes    VAD Patient?  No    PAD/SET Patient?  No      Pain Assessment   Currently in Pain?  No/denies    Multiple Pain Sites  No       Capillary Blood Glucose: No results found for this or any previous visit (from the past 24 hour(s)).  Exercise Prescription Changes - 08/28/18 1400      Response to Exercise   Blood Pressure (Admit)  102/60    Blood Pressure (Exercise)  124/80    Blood Pressure (Exit)  112/60    Heart Rate (Admit)  81 bpm    Heart Rate (Exercise)  102 bpm    Heart Rate (Exit)  87 bpm    Rating of Perceived Exertion (Exercise)  12    Perceived Dyspnea (Exercise)  0    Symptoms  None    Comments  Pt oriented to exercise equipment    Duration  Progress to 30 minutes of  aerobic without signs/symptoms of physical distress    Intensity  THRR unchanged      Progression   Progression  Continue to progress workloads to maintain intensity without signs/symptoms of physical distress.    Average  METs  2.32      Resistance Training   Training Prescription  Yes    Weight  3lbs    Reps  10-15    Time  10 Minutes      Bike   Level  0.8    Minutes  10    METs  2.64      NuStep   Level  2    SPM  85    Minutes  10    METs  2      Track   Laps  6    Minutes  10    METs  2       Social History   Tobacco Use  Smoking Status Former Smoker  . Packs/day: 2.00  . Years: 52.00  . Pack years: 104.00  . Types: Cigarettes  . Last attempt to quit: 12/18/2017  . Years since quitting: 0.6  Smokeless Tobacco Never Used    Goals Met:  Exercise tolerated well  Goals Unmet:  Not Applicable  Comments: Pt started cardiac rehab today.  Pt tolerated light exercise without difficulty. VSS, telemetry-SR, asymptomatic.  Medication list reconciled. Pt denies barriers to medicaiton compliance.  PSYCHOSOCIAL ASSESSMENT:  PHQ-0.  Pt exhibits positive coping skills, hopeful outlook with supportive family. No psychosocial needs identified at this time, no psychosocial interventions necessary.  Pt oriented to exercise equipment and routine.    Understanding verbalized.    Dr. Fransico Him is Medical Director for Cardiac Rehab at [facility].

## 2018-08-30 ENCOUNTER — Encounter (HOSPITAL_COMMUNITY)
Admission: RE | Admit: 2018-08-30 | Discharge: 2018-08-30 | Disposition: A | Discharge status: Home / Self Care | Payer: Medicare Other | Source: Ambulatory Visit | Attending: Cardiology | Admitting: Cardiology

## 2018-08-30 ENCOUNTER — Ambulatory Visit (HOSPITAL_COMMUNITY): Payer: Medicare Other

## 2018-08-30 DIAGNOSIS — Z951 Presence of aortocoronary bypass graft: Secondary | ICD-10-CM

## 2018-09-01 ENCOUNTER — Encounter (HOSPITAL_COMMUNITY)
Admission: RE | Admit: 2018-09-01 | Discharge: 2018-09-01 | Disposition: A | Discharge status: Home / Self Care | Payer: Medicare Other | Source: Ambulatory Visit | Attending: Cardiology | Admitting: Cardiology

## 2018-09-01 ENCOUNTER — Ambulatory Visit (HOSPITAL_COMMUNITY): Payer: Medicare Other

## 2018-09-01 DIAGNOSIS — Z951 Presence of aortocoronary bypass graft: Secondary | ICD-10-CM

## 2018-09-04 ENCOUNTER — Ambulatory Visit (HOSPITAL_COMMUNITY): Payer: Medicare Other

## 2018-09-04 ENCOUNTER — Encounter (HOSPITAL_COMMUNITY)
Admission: RE | Admit: 2018-09-04 | Discharge: 2018-09-04 | Disposition: A | Discharge status: Home / Self Care | Payer: Medicare Other | Source: Ambulatory Visit | Attending: Cardiology | Admitting: Cardiology

## 2018-09-04 DIAGNOSIS — Z951 Presence of aortocoronary bypass graft: Secondary | ICD-10-CM | POA: Diagnosis not present

## 2018-09-06 ENCOUNTER — Ambulatory Visit (HOSPITAL_COMMUNITY): Payer: Medicare Other

## 2018-09-06 ENCOUNTER — Encounter (HOSPITAL_COMMUNITY)
Admission: RE | Admit: 2018-09-06 | Discharge: 2018-09-06 | Disposition: A | Discharge status: Home / Self Care | Payer: Medicare Other | Source: Ambulatory Visit | Attending: Cardiology | Admitting: Cardiology

## 2018-09-06 DIAGNOSIS — Z951 Presence of aortocoronary bypass graft: Secondary | ICD-10-CM

## 2018-09-07 NOTE — Progress Notes (Signed)
Cardiac Individual Treatment Plan  Patient Details  Name: Robert [name] MRN: [id number] Date of Birth: [birth year] Referring Provider:     CARDIAC REHAB PHASE II ORIENTATION from 08/22/2018 in [place]  Referring Provider  [name], MD      Initial Encounter Date:    CARDIAC REHAB PHASE II ORIENTATION from 08/22/2018 in [place]  Date  08/22/18      Visit Diagnosis: 03/16/18 CABG x4  Patient's Home Medications on Admission:  Current Outpatient Medications:  .  acetaminophen (TYLENOL) 500 MG tablet, Take 500 mg by mouth at bedtime., Disp: , Rfl:  .  aspirin EC 325 MG EC tablet, Take 1 tablet (325 mg total) by mouth daily., Disp: 30 tablet, Rfl: 0 .  atorvastatin (LIPITOR) 40 MG tablet, Take 1 tablet (40 mg total) by mouth at bedtime., Disp: 90 tablet, Rfl: 3 .  benzonatate (TESSALON) 200 MG capsule, , Disp: , Rfl: 0 .  carvedilol (COREG) 12.5 MG tablet, TAKE 1 TABLET BY MOUTH TWICE DAILY, Disp: 60 tablet, Rfl: 3 .  guaiFENesin (MUCINEX) 600 MG 12 hr tablet, Take 1 tablet (600 mg total) by mouth 2 (two) times daily. (Patient not taking: Reported on 08/16/2018), Disp: , Rfl:  .  methylPREDNISolone (MEDROL DOSEPAK) 4 MG TBPK tablet, FPD, Disp: , Rfl: 0 .  Polyethyl Glycol-Propyl Glycol (SYSTANE) 0.4-0.3 % SOLN, Place 1 drop into both eyes 2 (two) times daily., Disp: , Rfl:  .  sacubitril-valsartan (ENTRESTO) 97-103 MG, Take 1 tablet by mouth 2 (two) times daily., Disp: 60 tablet, Rfl: 6 .  spironolactone (ALDACTONE) 25 MG tablet, Take 1 tablet (25 mg total) by mouth daily., Disp: 30 tablet, Rfl: 3 .  VENTOLIN HFA 108 (90 Base) MCG/ACT inhaler, INHALE 2 PUFFS INTO THE LUNGS QID PRN, Disp: , Rfl: 0  Past Medical History: Past Medical History:  Diagnosis Date  . Childhood asthma   . Coronary artery disease   . Hay fever   . Hypertension   . Macular hole of left eye 12/23/2011  . Macular hole, right eye  03/05/2014  . Myocardial infarction [facility])    June 2019  . Neuromuscular disorder (HCC)    numbness in feet  . PONV (postoperative nausea and vomiting)   . Presence of external cardiac defibrillator 2019  . 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 Sinus headache    "hay fever season; not that often"  . Transverse myelitis ([place]) 2009   was treated by Dr Erling Cruz  . Umbilical hernia     Tobacco Use: Social History   Tobacco Use  Smoking Status Former Smoker  . Packs/day: 2.00  . Years: 52.00  . Pack years: 104.00  . Types: Cigarettes  . Last attempt to quit: 12/18/2017  . Years since quitting: 0.7  Smokeless Tobacco Never Used    Labs: Recent Review Flowsheet Data    Labs for ITP Cardiac and Pulmonary Rehab Latest Ref Rng & Units 03/17/2018 03/18/2018 03/19/2018 03/20/2018 03/21/2018   Cholestrol 0 - 200 mg/dL - - - - -   LDLCALC 0 - 99 mg/dL - - - - -   HDL >40 mg/dL - - - - -   Trlycerides <150 mg/dL - - - - -   Hemoglobin A1c 4.8 - 5.6 % - - - - -   PHART 7.350 - 7.450 - - - - -   PCO2ART 32.0 - 48.0 mmHg - - - - -  HCO3 20.0 - 28.0 mmol/L - - - - -   TCO2 22 - 32 mmol/L 22 26 27  - -   ACIDBASEDEF 0.0 - 2.0 mmol/L - - - - -   O2SAT % - 67.8 86.1 53.1 65.6      Capillary Blood Glucose: Lab Results  Component Value Date   GLUCAP 143 (H) 03/25/2018   GLUCAP 94 03/25/2018   GLUCAP 110 (H) 03/24/2018   GLUCAP 104 (H) 03/24/2018   GLUCAP 118 (H) 03/24/2018     Exercise Target Goals: Exercise Program Goal: Individual exercise prescription set using results from initial 6 min walk test and THRR while considering  patient's activity barriers and safety.   Exercise Prescription Goal: Initial exercise prescription builds to 30-45 minutes a day of aerobic activity, 2-3 days per week.  Home exercise guidelines will be given to patient during program as part of exercise prescription that the participant will acknowledge.  Activity Barriers & Risk  Stratification: Activity Barriers & Cardiac Risk Stratification - 08/22/18 1344      Activity Barriers & Cardiac Risk Stratification   Activity Barriers  Other (comment)    Comments  Right knee "issues".    Cardiac Risk Stratification  High       6 Minute Walk: 6 Minute Walk    Row Name 08/22/18 1409         6 Minute Walk   Phase  Initial     Distance  1340 feet     Walk Time  6 minutes     # of Rest Breaks  0     MPH  2.54     METS  2.61     RPE  11     Perceived Dyspnea   0     VO2 Peak  9.13     Symptoms  No     Resting HR  67 bpm     Resting BP  138/80     Resting Oxygen Saturation   95 %     Exercise Oxygen Saturation  during 6 min walk  97 %     Max Ex. HR  92 bpm     Max Ex. BP  148/72     2 Minute Post BP  122/68        Oxygen Initial Assessment:   Oxygen Re-Evaluation:   Oxygen Discharge (Final Oxygen Re-Evaluation):   Initial Exercise Prescription: Initial Exercise Prescription - 08/22/18 1500      Date of Initial Exercise RX and Referring Provider   Date  08/22/18    Referring Provider  [name], MD    Expected Discharge Date  11/24/18      Bike   Level  0.8    Minutes  10    METs  2.64      NuStep   Level  2    SPM  85    Minutes  10    METs  2.5      Track   Laps  9    Minutes  10    METs  2.57      Prescription Details   Frequency (times per week)  3    Duration  Progress to 30 minutes of continuous aerobic without signs/symptoms of physical distress      Intensity   THRR 40-80% of Max Heartrate  58-116    Ratings of Perceived Exertion  11-13    Perceived Dyspnea  0-4      Progression  Progression  Continue to progress workloads to maintain intensity without signs/symptoms of physical distress.      Resistance Training   Training Prescription  Yes    Weight  3lbs    Reps  10-15       Perform Capillary Blood Glucose checks as needed.  Exercise Prescription Changes: Exercise Prescription Changes    Row  Name 08/28/18 1400             Response to Exercise   Blood Pressure (Admit)  102/60       Blood Pressure (Exercise)  124/80       Blood Pressure (Exit)  112/60       Heart Rate (Admit)  81 bpm       Heart Rate (Exercise)  102 bpm       Heart Rate (Exit)  87 bpm       Rating of Perceived Exertion (Exercise)  12       Perceived Dyspnea (Exercise)  0       Symptoms  None       Comments  Pt oriented to exercise equipment       Duration  Progress to 30 minutes of  aerobic without signs/symptoms of physical distress       Intensity  THRR unchanged         Progression   Progression  Continue to progress workloads to maintain intensity without signs/symptoms of physical distress.       Average METs  2.32         Resistance Training   Training Prescription  Yes       Weight  3lbs       Reps  10-15       Time  10 Minutes         Bike   Level  0.8       Minutes  10       METs  2.64         NuStep   Level  2       SPM  85       Minutes  10       METs  2         Track   Laps  6       Minutes  10       METs  2          Exercise Comments: Exercise Comments    Row Name 08/28/18 1422           Exercise Comments  Pt's first day of exercise. Pt oriented to exercise equipment. Pt responded well to workloads. Will continue to monitor and progress as tolerated.          Exercise Goals and Review: Exercise Goals    Row Name 06/06/18 1547 08/22/18 1344           Exercise Goals   Increase Physical Activity  Yes  Yes      Intervention  Provide advice, education, support and counseling about physical activity/exercise needs.;Develop an individualized exercise prescription for aerobic and resistive training based on initial evaluation findings, risk stratification, comorbidities and participant's personal goals.  Provide advice, education, support and counseling about physical activity/exercise needs.;Develop an individualized exercise prescription for aerobic and resistive  training based on initial evaluation findings, risk stratification, comorbidities and participant's personal goals.      Expected Outcomes  Short Term: Attend rehab on a regular basis to increase amount of physical  activity.  Short Term: Attend rehab on a regular basis to increase amount of physical activity.;Long Term: Add in home exercise to make exercise part of routine and to increase amount of physical activity.;Long Term: Exercising regularly at least 3-5 days a week.      Increase Strength and Stamina  Yes  Yes      Intervention  Provide advice, education, support and counseling about physical activity/exercise needs.;Develop an individualized exercise prescription for aerobic and resistive training based on initial evaluation findings, risk stratification, comorbidities and participant's personal goals.  Provide advice, education, support and counseling about physical activity/exercise needs.;Develop an individualized exercise prescription for aerobic and resistive training based on initial evaluation findings, risk stratification, comorbidities and participant's personal goals.      Expected Outcomes  Short Term: Increase workloads from initial exercise prescription for resistance, speed, and METs.  Short Term: Increase workloads from initial exercise prescription for resistance, speed, and METs.;Short Term: Perform resistance training exercises routinely during rehab and add in resistance training at home;Long Term: Improve cardiorespiratory fitness, muscular endurance and strength as measured by increased METs and functional capacity (6MWT)      Able to understand and use rate of perceived exertion (RPE) scale  Yes  Yes      Intervention  Provide education and explanation on how to use RPE scale  Provide education and explanation on how to use RPE scale      Expected Outcomes  Short Term: Able to use RPE daily in rehab to express subjective intensity level;Long Term:  Able to use RPE to guide  intensity level when exercising independently  Short Term: Able to use RPE daily in rehab to express subjective intensity level;Long Term:  Able to use RPE to guide intensity level when exercising independently      Knowledge and understanding of Target Heart Rate Range (THRR)  Yes  Yes      Intervention  Provide education and explanation of THRR including how the numbers were predicted and where they are located for reference  Provide education and explanation of THRR including how the numbers were predicted and where they are located for reference      Expected Outcomes  Short Term: Able to state/look up THRR;Long Term: Able to use THRR to govern intensity when exercising independently;Short Term: Able to use daily as guideline for intensity in rehab  Short Term: Able to state/look up THRR;Long Term: Able to use THRR to govern intensity when exercising independently;Short Term: Able to use daily as guideline for intensity in rehab      Able to check pulse independently  Yes  Yes      Intervention  Provide education and demonstration on how to check pulse in carotid and radial arteries.;Review the importance of being able to check your own pulse for safety during independent exercise  Provide education and demonstration on how to check pulse in carotid and radial arteries.;Review the importance of being able to check your own pulse for safety during independent exercise      Expected Outcomes  Short Term: Able to explain why pulse checking is important during independent exercise;Long Term: Able to check pulse independently and accurately  Short Term: Able to explain why pulse checking is important during independent exercise;Long Term: Able to check pulse independently and accurately      Understanding of Exercise Prescription  Yes  Yes      Intervention  Provide education, explanation, and written materials on patient's individual exercise prescription  Provide  education, explanation, and written  materials on patient's individual exercise prescription      Expected Outcomes  Short Term: Able to explain program exercise prescription;Long Term: Able to explain home exercise prescription to exercise independently  Short Term: Able to explain program exercise prescription;Long Term: Able to explain home exercise prescription to exercise independently         Exercise Goals Re-Evaluation :   Discharge Exercise Prescription (Final Exercise Prescription Changes): Exercise Prescription Changes - 08/28/18 1400      Response to Exercise   Blood Pressure (Admit)  102/60    Blood Pressure (Exercise)  124/80    Blood Pressure (Exit)  112/60    Heart Rate (Admit)  81 bpm    Heart Rate (Exercise)  102 bpm    Heart Rate (Exit)  87 bpm    Rating of Perceived Exertion (Exercise)  12    Perceived Dyspnea (Exercise)  0    Symptoms  None    Comments  Pt oriented to exercise equipment    Duration  Progress to 30 minutes of  aerobic without signs/symptoms of physical distress    Intensity  THRR unchanged      Progression   Progression  Continue to progress workloads to maintain intensity without signs/symptoms of physical distress.    Average METs  2.32      Resistance Training   Training Prescription  Yes    Weight  3lbs    Reps  10-15    Time  10 Minutes      Bike   Level  0.8    Minutes  10    METs  2.64      NuStep   Level  2    SPM  85    Minutes  10    METs  2      Track   Laps  6    Minutes  10    METs  2       Nutrition:  Target Goals: Understanding of nutrition guidelines, daily intake of sodium 1500mg , cholesterol 200mg , calories 30% from fat and 7% or less from saturated fats, daily to have 5 or more servings of fruits and vegetables.  Biometrics: Pre Biometrics - 08/22/18 1334      Pre Biometrics   Height  5\' 9"  (1.753 m)    Weight  92.5 kg    Waist Circumference  43 inches    Hip Circumference  44.8 inches    Waist to Hip Ratio  0.96 %    BMI  (Calculated)  30.1    Triceps Skinfold  15 mm    % Body Fat  30 %    Grip Strength  36 kg    Flexibility  0 in    Single Leg Stand  4 seconds        Nutrition Therapy Plan and Nutrition Goals: Nutrition Therapy & Goals - 08/23/18 1331      Nutrition Therapy   Diet  heart healthy, carb modified      Personal Nutrition Goals   Nutrition Goal  Pt to identify and limit food sources of saturated fat, trans fat, refined carbohydrates and sodium    Personal Goal #2  Pt able to name foods that affect blood glucose.    Personal Goal #3  Pt able to name foods that affect blood glucose.    Personal Goal #4  Pt to eat a variety of non-starchy vegetables      Intervention Plan  Intervention  Prescribe, educate and counsel regarding individualized specific dietary modifications aiming towards targeted core components such as weight, hypertension, lipid management, diabetes, heart failure and other comorbidities.    Expected Outcomes  Short Term Goal: Understand basic principles of dietary content, such as calories, fat, sodium, cholesterol and nutrients.;Long Term Goal: Adherence to prescribed nutrition plan.       Nutrition Assessments: Nutrition Assessments - 08/23/18 1331      MEDFICTS Scores   Pre Score  56       Nutrition Goals Re-Evaluation:   Nutrition Goals Re-Evaluation:   Nutrition Goals Discharge (Final Nutrition Goals Re-Evaluation):   Psychosocial: Target Goals: Acknowledge presence or absence of significant depression and/or stress, maximize coping skills, provide positive support system. Participant is able to verbalize types and ability to use techniques and skills needed for reducing stress and depression.  Initial Review & Psychosocial Screening: Initial Psych Review & Screening - 08/22/18 1524      Initial Review   Current issues with  None Identified      Family Dynamics   Good Support System?  Yes   wife     Barriers   Psychosocial barriers to  participate in program  There are no identifiable barriers or psychosocial needs.      Screening Interventions   Interventions  Encouraged to exercise       Quality of Life Scores: Quality of Life - 08/22/18 1423      Quality of Life   Select  Quality of Life      Quality of Life Scores   Health/Function Pre  24.1 %    Socioeconomic Pre  24.43 %    Psych/Spiritual Pre  26.79 %    Family Pre  27 %    GLOBAL Pre  25.09 %      Scores of 19 and below usually indicate a poorer quality of life in these areas.  A difference of  2-3 points is a clinically meaningful difference.  A difference of 2-3 points in the total score of the Quality of Life Index has been associated with significant improvement in overall quality of life, self-image, physical symptoms, and general health in studies assessing change in quality of life.  PHQ-9: Recent Review Flowsheet Data    Depression screen [facility] 2/9 08/28/2018 05/24/2018   Decreased Interest 0 0   Down, Depressed, Hopeless 0 0   PHQ - 2 Score 0 0     Interpretation of Total Score  Total Score Depression Severity:  1-4 = Minimal depression, 5-9 = Mild depression, 10-14 = Moderate depression, 15-19 = Moderately severe depression, 20-27 = Severe depression   Psychosocial Evaluation and Intervention: Psychosocial Evaluation - 08/28/18 1419      Psychosocial Evaluation & Interventions   Interventions  Encouraged to exercise with the program and follow exercise prescription    Comments  No psychosocial interventions necessary.  Robert [name] enjoys watching TV.     Expected Outcomes  Robert [name] will continue to exhibit a positive outlook with good coping skills.     Continue Psychosocial Services   No Follow up required       Psychosocial Re-Evaluation:   Psychosocial Discharge (Final Psychosocial Re-Evaluation):   Vocational Rehabilitation: Provide vocational rehab assistance to qualifying candidates.   Vocational Rehab Evaluation &  Intervention: Vocational Rehab - 08/22/18 1422      Initial Vocational Rehab Evaluation & Intervention   Assessment shows need for Vocational Rehabilitation  No       Education:  Education Goals: Education classes will be provided on a weekly basis, covering required topics. Participant will state understanding/return demonstration of topics presented.  Learning Barriers/Preferences: Learning Barriers/Preferences - 08/22/18 1346      Learning Barriers/Preferences   Learning Barriers  Sight    Learning Preferences  Skilled Demonstration;Written Material;Group Instruction;Individual Instruction       Education Topics: Count Your Pulse:  -Group instruction provided by verbal instruction, demonstration, patient participation and written materials to support subject.  Instructors address importance of being able to find your pulse and how to count your pulse when at home without a heart monitor.  Patients get hands on experience counting their pulse with staff help and individually.   CARDIAC REHAB PHASE II EXERCISE from 09/06/2018 in [place]  Date  09/01/18  Educator  RN  Instruction Review Code  2- Demonstrated Understanding      Heart Attack, Angina, and Risk Factor Modification:  -Group instruction provided by verbal instruction, video, and written materials to support subject.  Instructors address signs and symptoms of angina and heart attacks.    Also discuss risk factors for heart disease and how to make changes to improve heart health risk factors.   Functional Fitness:  -Group instruction provided by verbal instruction, demonstration, patient participation, and written materials to support subject.  Instructors address safety measures for doing things around the house.  Discuss how to get up and down off the floor, how to pick things up properly, how to safely get out of a chair without assistance, and balance training.   Meditation and  Mindfulness:  -Group instruction provided by verbal instruction, patient participation, and written materials to support subject.  Instructor addresses importance of mindfulness and meditation practice to help reduce stress and improve awareness.  Instructor also leads participants through a meditation exercise.    CARDIAC REHAB PHASE II EXERCISE from 09/06/2018 in [place]  Date  09/06/18  Educator  RN  Instruction Review Code  2- Demonstrated Understanding      Stretching for Flexibility and Mobility:  -Group instruction provided by verbal instruction, patient participation, and written materials to support subject.  Instructors lead participants through series of stretches that are designed to increase flexibility thus improving mobility.  These stretches are additional exercise for major muscle groups that are typically performed during regular warm up and cool down.   Hands Only CPR:  -Group verbal, video, and participation provides a basic overview of AHA guidelines for community CPR. Role-play of emergencies allow participants the opportunity to practice calling for help and chest compression technique with discussion of AED use.   Hypertension: -Group verbal and written instruction that provides a basic overview of hypertension including the most recent diagnostic guidelines, risk factor reduction with self-care instructions and medication management.    Nutrition I class: Heart Healthy Eating:  -Group instruction provided by PowerPoint slides, verbal discussion, and written materials to support subject matter. The instructor gives an explanation and review of the Therapeutic Lifestyle Changes diet recommendations, which includes a discussion on lipid goals, dietary fat, sodium, fiber, plant stanol/sterol esters, sugar, and the components of a well-balanced, healthy diet.   Nutrition II class: Lifestyle Skills:  -Group instruction provided by  PowerPoint slides, verbal discussion, and written materials to support subject matter. The instructor gives an explanation and review of label reading, grocery shopping for heart health, heart healthy recipe modifications, and ways to make healthier choices when eating out.  Diabetes Question & Answer:  -Group instruction provided by PowerPoint slides, verbal discussion, and written materials to support subject matter. The instructor gives an explanation and review of diabetes co-morbidities, pre- and post-prandial blood glucose goals, pre-exercise blood glucose goals, signs, symptoms, and treatment of hypoglycemia and hyperglycemia, and foot care basics.   Diabetes Blitz:  -Group instruction provided by PowerPoint slides, verbal discussion, and written materials to support subject matter. The instructor gives an explanation and review of the physiology behind type 1 and type 2 diabetes, diabetes medications and rational behind using different medications, pre- and post-prandial blood glucose recommendations and Hemoglobin A1c goals, diabetes diet, and exercise including blood glucose guidelines for exercising safely.    Portion Distortion:  -Group instruction provided by PowerPoint slides, verbal discussion, written materials, and food models to support subject matter. The instructor gives an explanation of serving size versus portion size, changes in portions sizes over the last 20 years, and what consists of a serving from each food group.   Stress Management:  -Group instruction provided by verbal instruction, video, and written materials to support subject matter.  Instructors review role of stress in heart disease and how to cope with stress positively.     Exercising on Your Own:  -Group instruction provided by verbal instruction, power point, and written materials to support subject.  Instructors discuss benefits of exercise, components of exercise, frequency and intensity of exercise,  and end points for exercise.  Also discuss use of nitroglycerin and activating EMS.  Review options of places to exercise outside of rehab.  Review guidelines for sex with heart disease.   CARDIAC REHAB PHASE II EXERCISE from 09/06/2018 in [place]  Date  08/30/18  Educator  EP  Instruction Review Code  2- Demonstrated Understanding      Cardiac Drugs I:  -Group instruction provided by verbal instruction and written materials to support subject.  Instructor reviews cardiac drug classes: antiplatelets, anticoagulants, beta blockers, and statins.  Instructor discusses reasons, side effects, and lifestyle considerations for each drug class.   Cardiac Drugs II:  -Group instruction provided by verbal instruction and written materials to support subject.  Instructor reviews cardiac drug classes: angiotensin converting enzyme inhibitors (ACE-I), angiotensin II receptor blockers (ARBs), nitrates, and calcium channel blockers.  Instructor discusses reasons, side effects, and lifestyle considerations for each drug class.   Anatomy and Physiology of the Circulatory System:  Group verbal and written instruction and models provide basic cardiac anatomy and physiology, with the coronary electrical and arterial systems. Review of: AMI, Angina, Valve disease, Heart Failure, Peripheral Artery Disease, Cardiac Arrhythmia, Pacemakers, and the ICD.   Other Education:  -Group or individual verbal, written, or video instructions that support the educational goals of the cardiac rehab program.   Holiday Eating Survival Tips:  -Group instruction provided by PowerPoint slides, verbal discussion, and written materials to support subject matter. The instructor gives patients tips, tricks, and techniques to help them not only survive but enjoy the holidays despite the onslaught of food that accompanies the holidays.   Knowledge Questionnaire Score: Knowledge Questionnaire Score -  08/22/18 1424      Knowledge Questionnaire Score   Pre Score  27/28       Core Components/Risk Factors/Patient Goals at Admission: Personal Goals and Risk Factors at Admission - 08/22/18 1437      Core Components/Risk Factors/Patient Goals on Admission    Weight Management  Obesity;Yes    Intervention  Weight Management: Develop a  combined nutrition and exercise program designed to reach desired caloric intake, while maintaining appropriate intake of nutrient and fiber, sodium and fats, and appropriate energy expenditure required for the weight goal.;Weight Management: Provide education and appropriate resources to help participant work on and attain dietary goals.;Weight Management/Obesity: Establish reasonable short term and long term weight goals.;Obesity: Provide education and appropriate resources to help participant work on and attain dietary goals.    Admit Weight  203 lb 14.8 oz (92.5 kg)    Expected Outcomes  Long Term: Adherence to nutrition and physical activity/exercise program aimed toward attainment of established weight goal;Weight Loss: Understanding of general recommendations for a balanced deficit meal plan, which promotes 1-2 lb weight loss per week and includes a negative energy balance of [phone] kcal/d;Short Term: Continue to assess and modify interventions until short term weight is achieved;Understanding recommendations for meals to include 15-35% energy as protein, 25-35% energy from fat, 35-60% energy from carbohydrates, less than 200mg  of dietary cholesterol, 20-35 gm of total fiber daily;Understanding of distribution of calorie intake throughout the day with the consumption of 4-5 meals/snacks    Hypertension  Yes    Intervention  Provide education on lifestyle modifcations including regular physical activity/exercise, weight management, moderate sodium restriction and increased consumption of fresh fruit, vegetables, and low fat dairy, alcohol moderation, and smoking  cessation.;Monitor prescription use compliance.    Expected Outcomes  Short Term: Continued assessment and intervention until BP is < 140/53mm HG in hypertensive participants. < 130/31mm HG in hypertensive participants with diabetes, heart failure or chronic kidney disease.;Long Term: Maintenance of blood pressure at goal levels.    Lipids  Yes    Intervention  Provide education and support for participant on nutrition & aerobic/resistive exercise along with prescribed medications to achieve LDL 70mg , HDL >40mg .    Expected Outcomes  Short Term: Participant states understanding of desired cholesterol values and is compliant with medications prescribed. Participant is following exercise prescription and nutrition guidelines.;Long Term: Cholesterol controlled with medications as prescribed, with individualized exercise RX and with personalized nutrition plan. Value goals: LDL < 70mg , HDL > 40 mg.       Core Components/Risk Factors/Patient Goals Review:  Goals and Risk Factor Review    Row Name 08/28/18 1426             Core Components/Risk Factors/Patient Goals Review   Personal Goals Review  Weight Management/Obesity;Lipids;Hypertension       Review  Pt with multiple CAD RFs willing to participate in CR exercise.  Robert [name] would like to be able to participate in daily activities.       Expected Outcomes  Pt will continue to participate in CR exercise, nutrition, and lifestyle modification opportunities.           Core Components/Risk Factors/Patient Goals at Discharge (Final Review):  Goals and Risk Factor Review - 08/28/18 1426      Core Components/Risk Factors/Patient Goals Review   Personal Goals Review  Weight Management/Obesity;Lipids;Hypertension    Review  Pt with multiple CAD RFs willing to participate in CR exercise.  Robert [name] would like to be able to participate in daily activities.    Expected Outcomes  Pt will continue to participate in CR exercise, nutrition, and lifestyle  modification opportunities.        ITP Comments: ITP Comments    Row Name 08/22/18 1337 08/28/18 1215         ITP Comments  Medical Director- Dr. Fransico Him, MD  30 Day ITP Review. Pt  started exercise today and tolerated it well.          Comments: See ITP Comments.

## 2018-09-08 ENCOUNTER — Ambulatory Visit (HOSPITAL_COMMUNITY): Payer: Medicare Other

## 2018-09-08 ENCOUNTER — Encounter (HOSPITAL_COMMUNITY)
Admission: RE | Admit: 2018-09-08 | Discharge: 2018-09-08 | Disposition: A | Discharge status: Home / Self Care | Payer: Medicare Other | Source: Ambulatory Visit | Attending: Cardiology | Admitting: Cardiology

## 2018-09-08 DIAGNOSIS — Z951 Presence of aortocoronary bypass graft: Secondary | ICD-10-CM

## 2018-09-11 ENCOUNTER — Ambulatory Visit (HOSPITAL_COMMUNITY): Payer: Medicare Other

## 2018-09-11 ENCOUNTER — Encounter (HOSPITAL_COMMUNITY)
Admission: RE | Admit: 2018-09-11 | Discharge: 2018-09-11 | Disposition: A | Discharge status: Home / Self Care | Payer: Medicare Other | Source: Ambulatory Visit | Attending: Cardiology | Admitting: Cardiology

## 2018-09-11 DIAGNOSIS — Z951 Presence of aortocoronary bypass graft: Secondary | ICD-10-CM

## 2018-09-12 ENCOUNTER — Encounter (HOSPITAL_COMMUNITY): Payer: Medicare Other | Admitting: Cardiology

## 2018-09-12 DIAGNOSIS — H35343 Macular cyst, hole, or pseudohole, bilateral: Secondary | ICD-10-CM | POA: Diagnosis not present

## 2018-09-12 DIAGNOSIS — H524 Presbyopia: Secondary | ICD-10-CM | POA: Diagnosis not present

## 2018-09-12 DIAGNOSIS — H35373 Puckering of macula, bilateral: Secondary | ICD-10-CM | POA: Diagnosis not present

## 2018-09-12 DIAGNOSIS — H26491 Other secondary cataract, right eye: Secondary | ICD-10-CM | POA: Diagnosis not present

## 2018-09-13 ENCOUNTER — Ambulatory Visit (HOSPITAL_COMMUNITY): Payer: Medicare Other

## 2018-09-13 ENCOUNTER — Encounter (HOSPITAL_COMMUNITY)
Admission: RE | Admit: 2018-09-13 | Discharge: 2018-09-13 | Disposition: A | Discharge status: Home / Self Care | Payer: Medicare Other | Source: Ambulatory Visit | Attending: Cardiology | Admitting: Cardiology

## 2018-09-13 ENCOUNTER — Other Ambulatory Visit: Payer: Self-pay

## 2018-09-13 DIAGNOSIS — J449 Chronic obstructive pulmonary disease, unspecified: Secondary | ICD-10-CM | POA: Diagnosis not present

## 2018-09-13 DIAGNOSIS — Z951 Presence of aortocoronary bypass graft: Secondary | ICD-10-CM

## 2018-09-15 ENCOUNTER — Ambulatory Visit (HOSPITAL_COMMUNITY): Payer: Medicare Other

## 2018-09-15 ENCOUNTER — Encounter (HOSPITAL_COMMUNITY)
Admission: RE | Admit: 2018-09-15 | Discharge: 2018-09-15 | Disposition: A | Discharge status: Home / Self Care | Payer: Medicare Other | Source: Ambulatory Visit | Attending: Cardiology | Admitting: Cardiology

## 2018-09-15 ENCOUNTER — Other Ambulatory Visit: Payer: Self-pay

## 2018-09-15 DIAGNOSIS — Z951 Presence of aortocoronary bypass graft: Secondary | ICD-10-CM | POA: Diagnosis not present

## 2018-09-18 ENCOUNTER — Encounter (HOSPITAL_COMMUNITY): Payer: Self-pay | Admitting: Cardiology

## 2018-09-18 ENCOUNTER — Other Ambulatory Visit: Payer: Self-pay

## 2018-09-18 ENCOUNTER — Encounter (HOSPITAL_COMMUNITY): Payer: Medicare Other

## 2018-09-18 ENCOUNTER — Ambulatory Visit (HOSPITAL_COMMUNITY)
Admission: RE | Admit: 2018-09-18 | Discharge: 2018-09-18 | Disposition: A | Discharge status: Home / Self Care | Payer: Medicare Other | Source: Ambulatory Visit | Attending: Cardiology | Admitting: Cardiology

## 2018-09-18 ENCOUNTER — Telehealth (HOSPITAL_COMMUNITY): Payer: Self-pay

## 2018-09-18 ENCOUNTER — Ambulatory Visit (HOSPITAL_COMMUNITY): Payer: Medicare Other

## 2018-09-18 VITALS — BP 138/78 | HR 72 | Wt 207.0 lb

## 2018-09-18 DIAGNOSIS — Z809 Family history of malignant neoplasm, unspecified: Secondary | ICD-10-CM | POA: Insufficient documentation

## 2018-09-18 DIAGNOSIS — E785 Hyperlipidemia, unspecified: Secondary | ICD-10-CM | POA: Insufficient documentation

## 2018-09-18 DIAGNOSIS — Z8249 Family history of ischemic heart disease and other diseases of the circulatory system: Secondary | ICD-10-CM | POA: Diagnosis not present

## 2018-09-18 DIAGNOSIS — I25810 Atherosclerosis of coronary artery bypass graft(s) without angina pectoris: Secondary | ICD-10-CM | POA: Insufficient documentation

## 2018-09-18 DIAGNOSIS — I252 Old myocardial infarction: Secondary | ICD-10-CM | POA: Insufficient documentation

## 2018-09-18 DIAGNOSIS — I2510 Atherosclerotic heart disease of native coronary artery without angina pectoris: Secondary | ICD-10-CM

## 2018-09-18 DIAGNOSIS — Z7982 Long term (current) use of aspirin: Secondary | ICD-10-CM | POA: Insufficient documentation

## 2018-09-18 DIAGNOSIS — Z7952 Long term (current) use of systemic steroids: Secondary | ICD-10-CM | POA: Insufficient documentation

## 2018-09-18 DIAGNOSIS — Z87891 Personal history of nicotine dependence: Secondary | ICD-10-CM | POA: Diagnosis not present

## 2018-09-18 DIAGNOSIS — Z79899 Other long term (current) drug therapy: Secondary | ICD-10-CM | POA: Diagnosis not present

## 2018-09-18 DIAGNOSIS — Z951 Presence of aortocoronary bypass graft: Secondary | ICD-10-CM | POA: Insufficient documentation

## 2018-09-18 DIAGNOSIS — I255 Ischemic cardiomyopathy: Secondary | ICD-10-CM | POA: Insufficient documentation

## 2018-09-18 DIAGNOSIS — I5022 Chronic systolic (congestive) heart failure: Secondary | ICD-10-CM | POA: Diagnosis not present

## 2018-09-18 DIAGNOSIS — J449 Chronic obstructive pulmonary disease, unspecified: Secondary | ICD-10-CM | POA: Diagnosis not present

## 2018-09-18 LAB — BASIC METABOLIC PANEL
Anion gap: 8 (ref 5–15)
BUN: 16 mg/dL (ref 8–23)
CO2: 25 mmol/L (ref 22–32)
Calcium: 8.3 mg/dL — ABNORMAL LOW (ref 8.9–10.3)
Chloride: 104 mmol/L (ref 98–111)
Creatinine, Ser: 1.12 mg/dL (ref 0.61–1.24)
GFR calc Af Amer: >60 mL/min (ref >60)
GFR calc non Af Amer: >60 mL/min (ref >60)
Glucose, Bld: 94 mg/dL (ref 70–99)
Potassium: 4.5 mmol/L (ref 3.5–5.1)
Sodium: 137 mmol/L (ref 135–145)

## 2018-09-18 LAB — LIPID PANEL
Cholesterol: 107 mg/dL (ref 0–200)
HDL: 29 mg/dL — ABNORMAL LOW (ref >40)
LDL Cholesterol: 8 mg/dL (ref 0–99)
Total CHOL/HDL Ratio: 3.7 RATIO
Triglycerides: 351 mg/dL — ABNORMAL HIGH (ref <150)
VLDL: 70 mg/dL — ABNORMAL HIGH (ref 0–40)

## 2018-09-18 MED ORDER — OMEPRAZOLE 20 MG PO CPDR: 20.0000 mg | DELAYED_RELEASE_CAPSULE | Freq: Every day | ORAL | 6 refills | Status: DC

## 2018-09-18 MED ORDER — CARVEDILOL 12.5 MG PO TABS: 18.7500 mg | ORAL_TABLET | Freq: Two times a day (BID) | ORAL | 3 refills | Status: DC

## 2018-09-18 NOTE — Patient Instructions (Addendum)
INCREASE Coreg 18.75 (1.5 tabs)  You were given a prescription for Omeprazole 20mg  (1 tab) daily for 2 weeks to see if it helps your cough.  Labs today We will only contact you if something comes back abnormal or we need to make some changes. Otherwise no news is good news!  Your physician recommends that you schedule a follow-up appointment in: 4 months with Dr. Aundra Dubin. You will be called for this appointment

## 2018-09-19 ENCOUNTER — Other Ambulatory Visit (HOSPITAL_COMMUNITY): Payer: Self-pay

## 2018-09-19 ENCOUNTER — Telehealth (HOSPITAL_COMMUNITY): Payer: Self-pay

## 2018-09-19 DIAGNOSIS — I5022 Chronic systolic (congestive) heart failure: Secondary | ICD-10-CM

## 2018-09-19 MED ORDER — ICOSAPENT ETHYL 1 G PO CAPS: 2.0000 g | ORAL_CAPSULE | Freq: Two times a day (BID) | ORAL | 3 refills | Status: DC

## 2018-09-19 NOTE — Telephone Encounter (Signed)
Spoke with wife regarding results, verbalized understanding on medication addition (sent to pharm) and repeat lipids (19 May @1245 )

## 2018-09-19 NOTE — Progress Notes (Signed)
[facility]: Dr. Aundra Dubin PCP: Dr. Forde Dandy  78 y.o. with history of CAD and ischemic cardiomyopathy presents for followup of CHF. Patient had no cardiac history prior to 6/19. However, since around 12/18, he had noted exertional dyspnea.  In 6[place]19, [place]  He developed very severe dyspnea and went to the hospital in [place] where he was found to have NSTEMI.  LHC was done, showing chronic occlusions of the LAD and RCA with collaterals.  Echo showed EF 25-30%.  No intervention, he was eventually discharged to come home.  He has been seen by Dr. Prescott Gum for CABG evaluation.  Cardiac MRI in 6/19 showed EF 28% with substantial viability.  RHC was done in 7/19, showing mildly elevated filling pressures but low cardiac output, with CI 1.7.  After RHC, I increased his Lasix and added digoxin.    Of note, he has quit smoking since 6/19.   He was admitted for CABG in 9/19.  Volume overload post-op, treated with diuresis.    Echo in 12/19 showed EF 40%, mid anteroseptal and mid inferoseptal akinesis.   Patient is doing well. Main complaint is a chronic dry cough. No chest pain.  No significant exertional dyspnea. No orthopnea/PND.  He is doing cardiac rehab.    Labs (7/19): hgb 14.1, K 4.1, creatinine 1.26, LDL 28 Labs (9/19): K 3.7, creatinine 1.09, LFTs normal, hgb 12.6 Labs (11/19): K 4.4, creatinine 1.31 Labs (12/19): K 4.4, creatinine 1.19  PMH: 1. H/o retinal detachment 2. H/o transverse myelitis (remote) 3. CAD: NSTEMI 6/19 in [place]. - LHC (6/19): Chronic total occlusion of LAD and chronic total occlusion of RCA with collaterals.  - CABG (9/19): LIMA-LAD, SVG-D, SVG-ramus, SVG-PDA.  4. Chronic systolic CHF: Ischemic cardiomyopathy.   - Echo (6/19): LV moderately dilated, EF 25-30%, mild MR.  - Cardiac MRI (6/19): EF 28%, significant viability noted.  - RHC (7/19): mean RA 8, PA 40/16 mean 26, mean PCWP 23, CI 1.7, PVR 0.83 WU - Echo (12/19): EF  40%, mid inferoseptal and mid anteroseptal akinesis, inferior hypokinesis, normal RV size and systolic function.  5. COPD: PFTs (9/19) with moderate obstruction.   SH: Married, lives in [place].  He quit smoking in 6/19.    Family History  Problem Relation Age of Onset  . Heart disease Mother   . Cancer Mother   . Heart attack Father    ROS: All systems reviewed and negative except as per HPI.   Current Outpatient Medications  Medication Sig Dispense Refill  . acetaminophen (TYLENOL) 500 MG tablet Take 500 mg by mouth at bedtime.    Marland Kitchen aspirin EC 325 MG EC tablet Take 1 tablet (325 mg total) by mouth daily. 30 tablet 0  . atorvastatin (LIPITOR) 40 MG tablet Take 1 tablet (40 mg total) by mouth at bedtime. 90 tablet 3  . benzonatate (TESSALON) 200 MG capsule   0  . Polyethyl Glycol-Propyl Glycol (SYSTANE) 0.4-0.3 % SOLN Place 1 drop into both eyes 2 (two) times daily.    . prednisoLONE acetate (PRED MILD) 0.12 % ophthalmic suspension Place 1 drop into the right eye 4 (four) times daily.    . sacubitril-valsartan (ENTRESTO) 97-103 MG Take 1 tablet by mouth 2 (two) times daily. 60 tablet 6  . spironolactone (ALDACTONE) 25 MG tablet Take 1 tablet (25 mg total) by mouth daily. 30 tablet 3  . carvedilol (COREG) 12.5 MG tablet Take 1.5 tablets (18.75 mg total) by mouth 2 (two) times  daily. 90 tablet 3  . omeprazole (PRILOSEC) 20 MG capsule Take 1 capsule (20 mg total) by mouth daily. 30 capsule 6   No current facility-administered medications for this encounter.    BP 138/78   Pulse 72   Wt 93.9 kg (207 lb)   SpO2 95%   BMI 30.57 kg/m  General: NAD Neck: No JVD, no thyromegaly or thyroid nodule.  Lungs: Clear to auscultation bilaterally with normal respiratory effort. CV: Nondisplaced PMI.  Heart regular S1/S2, no S3/S4, no murmur.  Trace ankle edema.  No carotid bruit.  Normal pedal pulses.  Abdomen: Soft, nontender, no hepatosplenomegaly, no distention.  Skin: Intact without lesions  or rashes.  Neurologic: Alert and oriented x 3.  Psych: Normal affect. Extremities: No clubbing or cyanosis.  HEENT: Normal.   Assessment/Plan: 1. CAD: LHC in 6/19 with CTOs of LAD and RCA.  Cardiac MRI showed significant myocardial viability. He is s/p CABG x 4 in 9/19.   - Continue ASA 81 and statin.  - Continue cardiac rehab.  2. Chronic systolic CHF: Ischemic cardiomyopathy.  Echo in 6/19 with EF 25-20%.  Cardiac MRI in 7/19 with EF 28%, significant viability noted. RHC in 7/19 showed mildly elevated filling pressure and low cardiac output. Now s/p CABG in 9/19.  Echo post-op in 12/19 showed EF up to 40%.  Doing well today, NYHA class I-II symptoms and not volume overloaded. - Continue Entresto to 97/103 bid.    - Increase Coreg to 18.75 mg bid.   - Continue spironolactone 25 mg daily.  - EF is out of range of ICD.  - BMET today. 3. COPD: He has quit smoking since 6/19.  4. Hyperlipidemia: Check lipids today.   Followup in 4 months.   [name] 09/19/2018

## 2018-09-19 NOTE — Telephone Encounter (Signed)
-----   Message from Larey Dresser, MD sent at 09/19/2018 12:27 AM EDT ----- High triglycerides.  Start Vascepa 2 g bid. Lipids in 2 months.

## 2018-09-20 ENCOUNTER — Ambulatory Visit (HOSPITAL_COMMUNITY): Payer: Medicare Other

## 2018-09-20 ENCOUNTER — Encounter (HOSPITAL_COMMUNITY): Payer: Medicare Other

## 2018-09-22 ENCOUNTER — Ambulatory Visit (HOSPITAL_COMMUNITY): Payer: Medicare Other

## 2018-09-22 ENCOUNTER — Encounter (HOSPITAL_COMMUNITY): Payer: Medicare Other

## 2018-09-25 ENCOUNTER — Encounter (HOSPITAL_COMMUNITY): Payer: Medicare Other

## 2018-09-25 ENCOUNTER — Ambulatory Visit (HOSPITAL_COMMUNITY): Payer: Medicare Other

## 2018-09-27 ENCOUNTER — Ambulatory Visit (HOSPITAL_COMMUNITY): Payer: Medicare Other

## 2018-09-27 ENCOUNTER — Encounter (HOSPITAL_COMMUNITY): Payer: Medicare Other

## 2018-09-28 ENCOUNTER — Telehealth (HOSPITAL_COMMUNITY): Payer: Self-pay

## 2018-09-28 ENCOUNTER — Telehealth (HOSPITAL_COMMUNITY): Payer: Self-pay | Admitting: *Deleted

## 2018-09-28 NOTE — Progress Notes (Addendum)
Cardiac Individual Treatment Plan  Patient Details  Name: Robert [name] MRN: [id number] Date of Birth: [birth year] Referring Provider:     CARDIAC REHAB PHASE II ORIENTATION from 08/22/2018 in [facility]  Referring Provider  [name], MD      Initial Encounter Date:    CARDIAC REHAB PHASE II ORIENTATION from 08/22/2018 in [facility]  Date  08/22/18      Visit Diagnosis: 03/16/18 CABG x4  Patient's Home Medications on Admission:  Current Outpatient Medications:  .  acetaminophen (TYLENOL) 500 MG tablet, Take 500 mg by mouth at bedtime., Disp: , Rfl:  .  aspirin EC 325 MG EC tablet, Take 1 tablet (325 mg total) by mouth daily., Disp: 30 tablet, Rfl: 0 .  atorvastatin (LIPITOR) 40 MG tablet, Take 1 tablet (40 mg total) by mouth at bedtime., Disp: 90 tablet, Rfl: 3 .  benzonatate (TESSALON) 200 MG capsule, , Disp: , Rfl: 0 .  carvedilol (COREG) 12.5 MG tablet, Take 1.5 tablets (18.75 mg total) by mouth 2 (two) times daily., Disp: 90 tablet, Rfl: 3 .  Icosapent Ethyl 1 g CAPS, Take 2 capsules (2 g total) by mouth 2 (two) times daily., Disp: 180 capsule, Rfl: 3 .  omeprazole (PRILOSEC) 20 MG capsule, Take 1 capsule (20 mg total) by mouth daily., Disp: 30 capsule, Rfl: 6 .  Polyethyl Glycol-Propyl Glycol (SYSTANE) 0.4-0.3 % SOLN, Place 1 drop into both eyes 2 (two) times daily., Disp: , Rfl:  .  prednisoLONE acetate (PRED MILD) 0.12 % ophthalmic suspension, Place 1 drop into the right eye 4 (four) times daily., Disp: , Rfl:  .  sacubitril-valsartan (ENTRESTO) 97-103 MG, Take 1 tablet by mouth 2 (two) times daily., Disp: 60 tablet, Rfl: 6 .  spironolactone (ALDACTONE) 25 MG tablet, Take 1 tablet (25 mg total) by mouth daily., Disp: 30 tablet, Rfl: 3  Past Medical History: Past Medical History:  Diagnosis Date  . Childhood asthma   . Coronary artery disease   . Hay fever   . Hypertension   . Macular hole of left  eye 12/23/2011  . Macular hole, right eye 03/05/2014  . Myocardial infarction [facility])    June 2019  . Neuromuscular disorder (HCC)    numbness in feet  . PONV (postoperative nausea and vomiting)   . Presence of external cardiac defibrillator 2019  . 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 Sinus headache    "hay fever season; not that often"  . Transverse myelitis ([place]) 2009   was treated by Dr Erling Cruz  . Umbilical hernia     Tobacco Use: Social History   Tobacco Use  Smoking Status Former Smoker  . Packs/day: 2.00  . Years: 52.00  . Pack years: 104.00  . Types: Cigarettes  . Last attempt to quit: 12/18/2017  . Years since quitting: 0.7  Smokeless Tobacco Never Used    Labs: Recent Review Scientist, physiological    Labs for ITP Cardiac and Pulmonary Rehab Latest Ref Rng & Units 03/18/2018 03/19/2018 03/20/2018 03/21/2018 09/18/2018   Cholestrol 0 - 200 mg/dL - - - - 107   LDLCALC 0 - 99 mg/dL - - - - 8   HDL >40 mg/dL - - - - 29(L)   Trlycerides <150 mg/dL - - - - 351(H)   Hemoglobin A1c 4.8 - 5.6 % - - - - -   PHART 7.350 - 7.450 - - - - -   PCO2ART  32.0 - 48.0 mmHg - - - - -   HCO3 20.0 - 28.0 mmol/L - - - - -   TCO2 22 - 32 mmol/L 26 27 - - -   ACIDBASEDEF 0.0 - 2.0 mmol/L - - - - -   O2SAT % 67.8 86.1 53.1 65.6 -      Capillary Blood Glucose: Lab Results  Component Value Date   GLUCAP 143 (H) 03/25/2018   GLUCAP 94 03/25/2018   GLUCAP 110 (H) 03/24/2018   GLUCAP 104 (H) 03/24/2018   GLUCAP 118 (H) 03/24/2018     Exercise Target Goals: Exercise Program Goal: Individual exercise prescription set using results from initial 6 min walk test and THRR while considering  patient's activity barriers and safety.   Exercise Prescription Goal: Initial exercise prescription builds to 30-45 minutes a day of aerobic activity, 2-3 days per week.  Home exercise guidelines will be given to patient during program as part of exercise prescription that the participant will  acknowledge.  Activity Barriers & Risk Stratification: Activity Barriers & Cardiac Risk Stratification - 08/22/18 1344      Activity Barriers & Cardiac Risk Stratification   Activity Barriers  Other (comment)    Comments  Right knee "issues".    Cardiac Risk Stratification  High       6 Minute Walk: 6 Minute Walk    Row Name 08/22/18 1409         6 Minute Walk   Phase  Initial     Distance  1340 feet     Walk Time  6 minutes     # of Rest Breaks  0     MPH  2.54     METS  2.61     RPE  11     Perceived Dyspnea   0     VO2 Peak  9.13     Symptoms  No     Resting HR  67 bpm     Resting BP  138/80     Resting Oxygen Saturation   95 %     Exercise Oxygen Saturation  during 6 min walk  97 %     Max Ex. HR  92 bpm     Max Ex. BP  148/72     2 Minute Post BP  122/68        Oxygen Initial Assessment:   Oxygen Re-Evaluation:   Oxygen Discharge (Final Oxygen Re-Evaluation):   Initial Exercise Prescription: Initial Exercise Prescription - 08/22/18 1500      Date of Initial Exercise RX and Referring Provider   Date  08/22/18    Referring Provider  [name], MD    Expected Discharge Date  11/24/18      Bike   Level  0.8    Minutes  10    METs  2.64      NuStep   Level  2    SPM  85    Minutes  10    METs  2.5      Track   Laps  9    Minutes  10    METs  2.57      Prescription Details   Frequency (times per week)  3    Duration  Progress to 30 minutes of continuous aerobic without signs/symptoms of physical distress      Intensity   THRR 40-80% of Max Heartrate  58-116    Ratings of Perceived Exertion  11-13  Perceived Dyspnea  0-4      Progression   Progression  Continue to progress workloads to maintain intensity without signs/symptoms of physical distress.      Resistance Training   Training Prescription  Yes    Weight  3lbs    Reps  10-15       Perform Capillary Blood Glucose checks as needed.  Exercise Prescription  Changes: Exercise Prescription Changes    Row Name 08/28/18 1400             Response to Exercise   Blood Pressure (Admit)  102/60       Blood Pressure (Exercise)  124/80       Blood Pressure (Exit)  112/60       Heart Rate (Admit)  81 bpm       Heart Rate (Exercise)  102 bpm       Heart Rate (Exit)  87 bpm       Rating of Perceived Exertion (Exercise)  12       Perceived Dyspnea (Exercise)  0       Symptoms  None       Comments  Pt oriented to exercise equipment       Duration  Progress to 30 minutes of  aerobic without signs/symptoms of physical distress       Intensity  THRR unchanged         Progression   Progression  Continue to progress workloads to maintain intensity without signs/symptoms of physical distress.       Average METs  2.32         Resistance Training   Training Prescription  Yes       Weight  3lbs       Reps  10-15       Time  10 Minutes         Bike   Level  0.8       Minutes  10       METs  2.64         NuStep   Level  2       SPM  85       Minutes  10       METs  2         Track   Laps  6       Minutes  10       METs  2          Exercise Comments: Exercise Comments    Row Name 08/28/18 1422           Exercise Comments  Pt's first day of exercise. Pt oriented to exercise equipment. Pt responded well to workloads. Will continue to monitor and progress as tolerated.          Exercise Goals and Review: Exercise Goals    Row Name 06/06/18 1547 08/22/18 1344           Exercise Goals   Increase Physical Activity  Yes  Yes      Intervention  Provide advice, education, support and counseling about physical activity/exercise needs.;Develop an individualized exercise prescription for aerobic and resistive training based on initial evaluation findings, risk stratification, comorbidities and participant's personal goals.  Provide advice, education, support and counseling about physical activity/exercise needs.;Develop an individualized  exercise prescription for aerobic and resistive training based on initial evaluation findings, risk stratification, comorbidities and participant's personal goals.      Expected Outcomes  Short  Term: Attend rehab on a regular basis to increase amount of physical activity.  Short Term: Attend rehab on a regular basis to increase amount of physical activity.;Long Term: Add in home exercise to make exercise part of routine and to increase amount of physical activity.;Long Term: Exercising regularly at least 3-5 days a week.      Increase Strength and Stamina  Yes  Yes      Intervention  Provide advice, education, support and counseling about physical activity/exercise needs.;Develop an individualized exercise prescription for aerobic and resistive training based on initial evaluation findings, risk stratification, comorbidities and participant's personal goals.  Provide advice, education, support and counseling about physical activity/exercise needs.;Develop an individualized exercise prescription for aerobic and resistive training based on initial evaluation findings, risk stratification, comorbidities and participant's personal goals.      Expected Outcomes  Short Term: Increase workloads from initial exercise prescription for resistance, speed, and METs.  Short Term: Increase workloads from initial exercise prescription for resistance, speed, and METs.;Short Term: Perform resistance training exercises routinely during rehab and add in resistance training at home;Long Term: Improve cardiorespiratory fitness, muscular endurance and strength as measured by increased METs and functional capacity (6MWT)      Able to understand and use rate of perceived exertion (RPE) scale  Yes  Yes      Intervention  Provide education and explanation on how to use RPE scale  Provide education and explanation on how to use RPE scale      Expected Outcomes  Short Term: Able to use RPE daily in rehab to express subjective intensity  level;Long Term:  Able to use RPE to guide intensity level when exercising independently  Short Term: Able to use RPE daily in rehab to express subjective intensity level;Long Term:  Able to use RPE to guide intensity level when exercising independently      Knowledge and understanding of Target Heart Rate Range (THRR)  Yes  Yes      Intervention  Provide education and explanation of THRR including how the numbers were predicted and where they are located for reference  Provide education and explanation of THRR including how the numbers were predicted and where they are located for reference      Expected Outcomes  Short Term: Able to state/look up THRR;Long Term: Able to use THRR to govern intensity when exercising independently;Short Term: Able to use daily as guideline for intensity in rehab  Short Term: Able to state/look up THRR;Long Term: Able to use THRR to govern intensity when exercising independently;Short Term: Able to use daily as guideline for intensity in rehab      Able to check pulse independently  Yes  Yes      Intervention  Provide education and demonstration on how to check pulse in carotid and radial arteries.;Review the importance of being able to check your own pulse for safety during independent exercise  Provide education and demonstration on how to check pulse in carotid and radial arteries.;Review the importance of being able to check your own pulse for safety during independent exercise      Expected Outcomes  Short Term: Able to explain why pulse checking is important during independent exercise;Long Term: Able to check pulse independently and accurately  Short Term: Able to explain why pulse checking is important during independent exercise;Long Term: Able to check pulse independently and accurately      Understanding of Exercise Prescription  Yes  Yes      Intervention  Provide  education, explanation, and written materials on patient's individual exercise prescription  Provide  education, explanation, and written materials on patient's individual exercise prescription      Expected Outcomes  Short Term: Able to explain program exercise prescription;Long Term: Able to explain home exercise prescription to exercise independently  Short Term: Able to explain program exercise prescription;Long Term: Able to explain home exercise prescription to exercise independently         Exercise Goals Re-Evaluation :   Discharge Exercise Prescription (Final Exercise Prescription Changes): Exercise Prescription Changes - 08/28/18 1400      Response to Exercise   Blood Pressure (Admit)  102/60    Blood Pressure (Exercise)  124/80    Blood Pressure (Exit)  112/60    Heart Rate (Admit)  81 bpm    Heart Rate (Exercise)  102 bpm    Heart Rate (Exit)  87 bpm    Rating of Perceived Exertion (Exercise)  12    Perceived Dyspnea (Exercise)  0    Symptoms  None    Comments  Pt oriented to exercise equipment    Duration  Progress to 30 minutes of  aerobic without signs/symptoms of physical distress    Intensity  THRR unchanged      Progression   Progression  Continue to progress workloads to maintain intensity without signs/symptoms of physical distress.    Average METs  2.32      Resistance Training   Training Prescription  Yes    Weight  3lbs    Reps  10-15    Time  10 Minutes      Bike   Level  0.8    Minutes  10    METs  2.64      NuStep   Level  2    SPM  85    Minutes  10    METs  2      Track   Laps  6    Minutes  10    METs  2       Nutrition:  Target Goals: Understanding of nutrition guidelines, daily intake of sodium 1500mg , cholesterol 200mg , calories 30% from fat and 7% or less from saturated fats, daily to have 5 or more servings of fruits and vegetables.  Biometrics: Pre Biometrics - 08/22/18 1334      Pre Biometrics   Height  5\' 9"  (1.753 m)    Weight  92.5 kg    Waist Circumference  43 inches    Hip Circumference  44.8 inches    Waist to  Hip Ratio  0.96 %    BMI (Calculated)  30.1    Triceps Skinfold  15 mm    % Body Fat  30 %    Grip Strength  36 kg    Flexibility  0 in    Single Leg Stand  4 seconds        Nutrition Therapy Plan and Nutrition Goals: Nutrition Therapy & Goals - 08/23/18 1331      Nutrition Therapy   Diet  heart healthy, carb modified      Personal Nutrition Goals   Nutrition Goal  Pt to identify and limit food sources of saturated fat, trans fat, refined carbohydrates and sodium    Personal Goal #2  Pt able to name foods that affect blood glucose.    Personal Goal #3  Pt able to name foods that affect blood glucose.    Personal Goal #4  Pt to eat  a variety of non-starchy vegetables      Intervention Plan   Intervention  Prescribe, educate and counsel regarding individualized specific dietary modifications aiming towards targeted core components such as weight, hypertension, lipid management, diabetes, heart failure and other comorbidities.    Expected Outcomes  Short Term Goal: Understand basic principles of dietary content, such as calories, fat, sodium, cholesterol and nutrients.;Long Term Goal: Adherence to prescribed nutrition plan.       Nutrition Assessments: Nutrition Assessments - 08/23/18 1331      MEDFICTS Scores   Pre Score  56       Nutrition Goals Re-Evaluation:   Nutrition Goals Re-Evaluation:   Nutrition Goals Discharge (Final Nutrition Goals Re-Evaluation):   Psychosocial: Target Goals: Acknowledge presence or absence of significant depression and/or stress, maximize coping skills, provide positive support system. Participant is able to verbalize types and ability to use techniques and skills needed for reducing stress and depression.  Initial Review & Psychosocial Screening: Initial Psych Review & Screening - 08/22/18 1524      Initial Review   Current issues with  None Identified      Family Dynamics   Good Support System?  Yes   wife     Barriers    Psychosocial barriers to participate in program  There are no identifiable barriers or psychosocial needs.      Screening Interventions   Interventions  Encouraged to exercise       Quality of Life Scores: Quality of Life - 08/22/18 1423      Quality of Life   Select  Quality of Life      Quality of Life Scores   Health/Function Pre  24.1 %    Socioeconomic Pre  24.43 %    Psych/Spiritual Pre  26.79 %    Family Pre  27 %    GLOBAL Pre  25.09 %      Scores of 19 and below usually indicate a poorer quality of life in these areas.  A difference of  2-3 points is a clinically meaningful difference.  A difference of 2-3 points in the total score of the Quality of Life Index has been associated with significant improvement in overall quality of life, self-image, physical symptoms, and general health in studies assessing change in quality of life.  PHQ-9: Recent Review Flowsheet Data    Depression screen [facility] 2/9 08/28/2018 05/24/2018   Decreased Interest 0 0   Down, Depressed, Hopeless 0 0   PHQ - 2 Score 0 0     Interpretation of Total Score  Total Score Depression Severity:  1-4 = Minimal depression, 5-9 = Mild depression, 10-14 = Moderate depression, 15-19 = Moderately severe depression, 20-27 = Severe depression   Psychosocial Evaluation and Intervention: Psychosocial Evaluation - 08/28/18 1419      Psychosocial Evaluation & Interventions   Interventions  Encouraged to exercise with the program and follow exercise prescription    Comments  No psychosocial interventions necessary.  Ogden enjoys watching TV.     Expected Outcomes  Demetrio will continue to exhibit a positive outlook with good coping skills.     Continue Psychosocial Services   No Follow up required       Psychosocial Re-Evaluation: Psychosocial Re-Evaluation    [place] Name 09/21/18 0821             Psychosocial Re-Evaluation   Current issues with  None Identified       Comments  No psychosocial  interventions necessary.  Expected Outcomes  Mahamadou will maintain a positive outlook with good coping skills.        Interventions  Encouraged to attend Cardiac Rehabilitation for the exercise       Continue Psychosocial Services   No Follow up required          Psychosocial Discharge (Final Psychosocial Re-Evaluation): Psychosocial Re-Evaluation - 09/21/18 3716      Psychosocial Re-Evaluation   Current issues with  None Identified    Comments  No psychosocial interventions necessary.    Expected Outcomes  Aycen will maintain a positive outlook with good coping skills.     Interventions  Encouraged to attend Cardiac Rehabilitation for the exercise    Continue Psychosocial Services   No Follow up required       Vocational Rehabilitation: Provide vocational rehab assistance to qualifying candidates.   Vocational Rehab Evaluation & Intervention: Vocational Rehab - 08/22/18 1422      Initial Vocational Rehab Evaluation & Intervention   Assessment shows need for Vocational Rehabilitation  No       Education: Education Goals: Education classes will be provided on a weekly basis, covering required topics. Participant will state understanding/return demonstration of topics presented.  Learning Barriers/Preferences: Learning Barriers/Preferences - 08/22/18 1346      Learning Barriers/Preferences   Learning Barriers  Sight    Learning Preferences  Skilled Demonstration;Written Material;Group Instruction;Individual Instruction       Education Topics: Count Your Pulse:  -Group instruction provided by verbal instruction, demonstration, patient participation and written materials to support subject.  Instructors address importance of being able to find your pulse and how to count your pulse when at home without a heart monitor.  Patients get hands on experience counting their pulse with staff help and individually.   CARDIAC REHAB PHASE II EXERCISE from 09/15/2018 in [place]  Date  09/01/18  Educator  RN  Instruction Review Code  2- Demonstrated Understanding      Heart Attack, Angina, and Risk Factor Modification:  -Group instruction provided by verbal instruction, video, and written materials to support subject.  Instructors address signs and symptoms of angina and heart attacks.    Also discuss risk factors for heart disease and how to make changes to improve heart health risk factors.   Functional Fitness:  -Group instruction provided by verbal instruction, demonstration, patient participation, and written materials to support subject.  Instructors address safety measures for doing things around the house.  Discuss how to get up and down off the floor, how to pick things up properly, how to safely get out of a chair without assistance, and balance training.   Meditation and Mindfulness:  -Group instruction provided by verbal instruction, patient participation, and written materials to support subject.  Instructor addresses importance of mindfulness and meditation practice to help reduce stress and improve awareness.  Instructor also leads participants through a meditation exercise.    CARDIAC REHAB PHASE II EXERCISE from 09/15/2018 in [place]  Date  09/06/18  Educator  RN  Instruction Review Code  2- Demonstrated Understanding      Stretching for Flexibility and Mobility:  -Group instruction provided by verbal instruction, patient participation, and written materials to support subject.  Instructors lead participants through series of stretches that are designed to increase flexibility thus improving mobility.  These stretches are additional exercise for major muscle groups that are typically performed during regular warm up and cool down.   Hands  Only CPR:  -Group verbal, video, and participation provides a basic overview of AHA guidelines for community CPR. Role-play of emergencies allow  participants the opportunity to practice calling for help and chest compression technique with discussion of AED use.   Hypertension: -Group verbal and written instruction that provides a basic overview of hypertension including the most recent diagnostic guidelines, risk factor reduction with self-care instructions and medication management.   CARDIAC REHAB PHASE II EXERCISE from 09/15/2018 in [place]  Date  09/15/18  Instruction Review Code  2- Demonstrated Understanding       Nutrition I class: Heart Healthy Eating:  -Group instruction provided by PowerPoint slides, verbal discussion, and written materials to support subject matter. The instructor gives an explanation and review of the Therapeutic Lifestyle Changes diet recommendations, which includes a discussion on lipid goals, dietary fat, sodium, fiber, plant stanol/sterol esters, sugar, and the components of a well-balanced, healthy diet.   Nutrition II class: Lifestyle Skills:  -Group instruction provided by PowerPoint slides, verbal discussion, and written materials to support subject matter. The instructor gives an explanation and review of label reading, grocery shopping for heart health, heart healthy recipe modifications, and ways to make healthier choices when eating out.   Diabetes Question & Answer:  -Group instruction provided by PowerPoint slides, verbal discussion, and written materials to support subject matter. The instructor gives an explanation and review of diabetes co-morbidities, pre- and post-prandial blood glucose goals, pre-exercise blood glucose goals, signs, symptoms, and treatment of hypoglycemia and hyperglycemia, and foot care basics.   Diabetes Blitz:  -Group instruction provided by PowerPoint slides, verbal discussion, and written materials to support subject matter. The instructor gives an explanation and review of the physiology behind type 1 and type 2 diabetes,  diabetes medications and rational behind using different medications, pre- and post-prandial blood glucose recommendations and Hemoglobin A1c goals, diabetes diet, and exercise including blood glucose guidelines for exercising safely.    Portion Distortion:  -Group instruction provided by PowerPoint slides, verbal discussion, written materials, and food models to support subject matter. The instructor gives an explanation of serving size versus portion size, changes in portions sizes over the last 20 years, and what consists of a serving from each food group.   Stress Management:  -Group instruction provided by verbal instruction, video, and written materials to support subject matter.  Instructors review role of stress in heart disease and how to cope with stress positively.     Exercising on Your Own:  -Group instruction provided by verbal instruction, power point, and written materials to support subject.  Instructors discuss benefits of exercise, components of exercise, frequency and intensity of exercise, and end points for exercise.  Also discuss use of nitroglycerin and activating EMS.  Review options of places to exercise outside of rehab.  Review guidelines for sex with heart disease.   CARDIAC REHAB PHASE II EXERCISE from 09/15/2018 in [place]  Date  08/30/18  Educator  EP  Instruction Review Code  2- Demonstrated Understanding      Cardiac Drugs I:  -Group instruction provided by verbal instruction and written materials to support subject.  Instructor reviews cardiac drug classes: antiplatelets, anticoagulants, beta blockers, and statins.  Instructor discusses reasons, side effects, and lifestyle considerations for each drug class.   Cardiac Drugs II:  -Group instruction provided by verbal instruction and written materials to support subject.  Instructor reviews cardiac drug classes: angiotensin converting enzyme inhibitors (ACE-I), angiotensin  II  receptor blockers (ARBs), nitrates, and calcium channel blockers.  Instructor discusses reasons, side effects, and lifestyle considerations for each drug class.   Anatomy and Physiology of the Circulatory System:  Group verbal and written instruction and models provide basic cardiac anatomy and physiology, with the coronary electrical and arterial systems. Review of: AMI, Angina, Valve disease, Heart Failure, Peripheral Artery Disease, Cardiac Arrhythmia, Pacemakers, and the ICD.   Other Education:  -Group or individual verbal, written, or video instructions that support the educational goals of the cardiac rehab program.   Holiday Eating Survival Tips:  -Group instruction provided by PowerPoint slides, verbal discussion, and written materials to support subject matter. The instructor gives patients tips, tricks, and techniques to help them not only survive but enjoy the holidays despite the onslaught of food that accompanies the holidays.   Knowledge Questionnaire Score: Knowledge Questionnaire Score - 08/22/18 1424      Knowledge Questionnaire Score   Pre Score  27/28       Core Components/Risk Factors/Patient Goals at Admission: Personal Goals and Risk Factors at Admission - 08/22/18 1437      Core Components/Risk Factors/Patient Goals on Admission    Weight Management  Obesity;Yes    Intervention  Weight Management: Develop a combined nutrition and exercise program designed to reach desired caloric intake, while maintaining appropriate intake of nutrient and fiber, sodium and fats, and appropriate energy expenditure required for the weight goal.;Weight Management: Provide education and appropriate resources to help participant work on and attain dietary goals.;Weight Management/Obesity: Establish reasonable short term and long term weight goals.;Obesity: Provide education and appropriate resources to help participant work on and attain dietary goals.    Admit Weight  203 lb 14.8 oz  (92.5 kg)    Expected Outcomes  Long Term: Adherence to nutrition and physical activity/exercise program aimed toward attainment of established weight goal;Weight Loss: Understanding of general recommendations for a balanced deficit meal plan, which promotes 1-2 lb weight loss per week and includes a negative energy balance of [phone] kcal/d;Short Term: Continue to assess and modify interventions until short term weight is achieved;Understanding recommendations for meals to include 15-35% energy as protein, 25-35% energy from fat, 35-60% energy from carbohydrates, less than 200mg  of dietary cholesterol, 20-35 gm of total fiber daily;Understanding of distribution of calorie intake throughout the day with the consumption of 4-5 meals/snacks    Hypertension  Yes    Intervention  Provide education on lifestyle modifcations including regular physical activity/exercise, weight management, moderate sodium restriction and increased consumption of fresh fruit, vegetables, and low fat dairy, alcohol moderation, and smoking cessation.;Monitor prescription use compliance.    Expected Outcomes  Short Term: Continued assessment and intervention until BP is < 140/22mm HG in hypertensive participants. < 130/9mm HG in hypertensive participants with diabetes, heart failure or chronic kidney disease.;Long Term: Maintenance of blood pressure at goal levels.    Lipids  Yes    Intervention  Provide education and support for participant on nutrition & aerobic/resistive exercise along with prescribed medications to achieve LDL 70mg , HDL >40mg .    Expected Outcomes  Short Term: Participant states understanding of desired cholesterol values and is compliant with medications prescribed. Participant is following exercise prescription and nutrition guidelines.;Long Term: Cholesterol controlled with medications as prescribed, with individualized exercise RX and with personalized nutrition plan. Value goals: LDL < 70mg , HDL > 40 mg.        Core Components/Risk Factors/Patient Goals Review:  Goals and Risk Factor Review    Row  Name 08/28/18 1426 09/21/18 0315           Core Components/Risk Factors/Patient Goals Review   Personal Goals Review  Weight Management/Obesity;Lipids;Hypertension  Weight Management/Obesity;Lipids;Hypertension      Review  Pt with multiple CAD RFs willing to participate in CR exercise.  Alazar would like to be able to participate in daily activities.  Pt continues to tolerate exercise well.  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       Expected Outcomes  Pt will continue to participate in CR exercise, nutrition, and lifestyle modification opportunities.   Pt will continue to participate in CR exercise, nutrition, and lifestyle modification opportunities.          Core Components/Risk Factors/Patient Goals at Discharge (Final Review):  Goals and Risk Factor Review - 09/21/18 0821      Core Components/Risk Factors/Patient Goals Review   Personal Goals Review  Weight Management/Obesity;Lipids;Hypertension    Review  Pt continues to tolerate exercise well.  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     Expected Outcomes  Pt will continue to participate in CR exercise, nutrition, and lifestyle modification opportunities.        ITP Comments: ITP Comments    Row Name 08/22/18 1337 08/28/18 1215 09/21/18 0820       ITP Comments  Medical Director- Dr. Fransico Him, MD  30 Day ITP Review. Pt started exercise today and tolerated it well.   30 Day ITP Review. Pt continues to tolerate exercise well.  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         Comments: See ITP Comments.

## 2018-09-28 NOTE — Telephone Encounter (Signed)
Pt called and updated about extended closure of cardiac rehab for four weeks d/t CoVID-19.  Tentative reopen date of 10/23/2018.  No answer. VM left for patient.

## 2018-09-28 NOTE — Telephone Encounter (Signed)
Robert [name] 78 y.o. male DOB: 1941/03/29 MRN: [id number]      Nutrition Note  No diagnosis found. Past Medical History:  Diagnosis Date  . Childhood asthma   . Coronary artery disease   . Hay fever   . Hypertension   . Macular hole of left eye 12/23/2011  . Macular hole, right eye 03/05/2014  . Myocardial infarction [facility])    June 2019  . Neuromuscular disorder (HCC)    numbness in feet  . PONV (postoperative nausea and vomiting)   . Presence of external cardiac defibrillator 2019  . 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 Sinus headache    "hay fever season; not that often"  . Transverse myelitis ([place]) 2009   was treated by Dr Erling Cruz  . Umbilical hernia    Meds reviewed.    Current Outpatient Medications (Cardiovascular):  .  atorvastatin (LIPITOR) 40 MG tablet, Take 1 tablet (40 mg total) by mouth at bedtime. .  carvedilol (COREG) 12.5 MG tablet, Take 1.5 tablets (18.75 mg total) by mouth 2 (two) times daily. Vanessa Kick Ethyl 1 g CAPS, Take 2 capsules (2 g total) by mouth 2 (two) times daily. .  sacubitril-valsartan (ENTRESTO) 97-103 MG, Take 1 tablet by mouth 2 (two) times daily. Marland Kitchen  spironolactone (ALDACTONE) 25 MG tablet, Take 1 tablet (25 mg total) by mouth daily.  Current Outpatient Medications (Respiratory):  .  benzonatate (TESSALON) 200 MG capsule,   Current Outpatient Medications (Analgesics):  .  acetaminophen (TYLENOL) 500 MG tablet, Take 500 mg by mouth at bedtime. Marland Kitchen  aspirin EC 325 MG EC tablet, Take 1 tablet (325 mg total) by mouth daily.   Current Outpatient Medications (Other):  .  omeprazole (PRILOSEC) 20 MG capsule, Take 1 capsule (20 mg total) by mouth daily. Vladimir Faster Glycol-Propyl Glycol (SYSTANE) 0.4-0.3 % SOLN, Place 1 drop into both eyes 2 (two) times daily. .  prednisoLONE acetate (PRED MILD) 0.12 % ophthalmic suspension, Place 1 drop into the right eye 4 (four) times daily.  HT: Ht Readings from Last 1  Encounters:  08/22/18 5\' 9"  (1.753 m)    WT: Wt Readings from Last 5 Encounters:  09/18/18 207 lb (93.9 kg)  08/22/18 203 lb 14.8 oz (92.5 kg)  06/12/18 195 lb 12.8 oz (88.8 kg)  05/24/18 188 lb (85.3 kg)  05/08/18 186 lb 6.4 oz (84.6 kg)     There is no height or weight on file to calculate BMI.   Current tobacco use? No  Labs:  Lipid Panel     Component Value Date/Time   CHOL 107 09/18/2018 1406   TRIG 351 (H) 09/18/2018 1406   HDL 29 (L) 09/18/2018 1406   CHOLHDL 3.7 09/18/2018 1406   VLDL 70 (H) 09/18/2018 1406   LDLCALC 8 09/18/2018 1406    Lab Results  Component Value Date   HGBA1C 7.1 (H) 03/10/2018   CBG (last 3)  No results for input(s): GLUCAP in the last 72 hours.  Nutrition Note Spoke with patient, Pt is following a heart healthy diet. Pt continues to be resistant to cooking despite his desire to eat/follow heart healthy diet. Heart healthy eating tips reviewed (label reading, how to build a healthy plate, portion sizes, eating frequently across the day). Had a frank discussion with patient that if no changes are made, things will inevitably stay the same. Pt may have T2DM, last A1c was elevated. Discussed the differences between complex and refined carbs, recommended pt replace  refined carbs with complex. Reviewed the benefits swapping in complex carbs and moderating portion sizes can have on managing blood glucose with patient. Pt does not eat out frequently, especially since social distancing and isolation d/t the coronavirus. Pt expressed understanding of the information reviewed. Pt aware of nutrition education classes offered and would like to attend nutrition classes.  Nutrition Diagnosis ? Food-and nutrition-related knowledge deficit related to lack of exposure to information as related to diagnosis of: ? CVD,  possible DM d/t elevated A1C ? Overweight  related to excessive energy intake as evidenced by a BMI = 27.75     Nutrition Intervention ? Pt's  individual nutrition plan and goals reviewed with pt.  Nutrition Goal(s):  ? Pt to identify and limit food sources of saturated fat, trans fat, refined carbohydrates and sodium ? Pt to eat a variety of non-starchy vegetables. ? Pt to continue to cut down on beef or pork high in saturated fat. ? Pt able to name foods that affect blood glucose.   Plan:  ? Pt to attend nutrition classes:  ? Nutrition I ? Nutrition II ? Portion Distortion  ? Will provide client-centered nutrition education as part of interdisciplinary care ? Monitor and evaluate progress toward nutrition goal with team.   Laurina Bustle, MS, RD, LDN 09/28/2018 10:36 AM

## 2018-09-28 NOTE — Telephone Encounter (Signed)
Phone call to pt for nutrition education and counseling for heart healthy diet. Left voicemail with contact information.   Laurina Bustle, MS, RD, LDN 09/28/2018 2:17 PM

## 2018-09-29 ENCOUNTER — Ambulatory Visit (HOSPITAL_COMMUNITY): Payer: Medicare Other

## 2018-09-29 ENCOUNTER — Encounter (HOSPITAL_COMMUNITY): Payer: Medicare Other

## 2018-10-02 ENCOUNTER — Ambulatory Visit (HOSPITAL_COMMUNITY): Payer: Medicare Other

## 2018-10-02 ENCOUNTER — Encounter (HOSPITAL_COMMUNITY): Payer: Medicare Other

## 2018-10-04 ENCOUNTER — Encounter (HOSPITAL_COMMUNITY): Payer: Medicare Other

## 2018-10-04 ENCOUNTER — Ambulatory Visit (HOSPITAL_COMMUNITY): Payer: Medicare Other

## 2018-10-06 ENCOUNTER — Ambulatory Visit (HOSPITAL_COMMUNITY): Payer: Medicare Other

## 2018-10-06 ENCOUNTER — Encounter (HOSPITAL_COMMUNITY): Payer: Medicare Other

## 2018-10-09 ENCOUNTER — Ambulatory Visit (HOSPITAL_COMMUNITY): Payer: Medicare Other

## 2018-10-09 ENCOUNTER — Encounter (HOSPITAL_COMMUNITY): Payer: Medicare Other

## 2018-10-11 ENCOUNTER — Encounter (HOSPITAL_COMMUNITY): Payer: Medicare Other

## 2018-10-11 ENCOUNTER — Ambulatory Visit (HOSPITAL_COMMUNITY): Payer: Medicare Other

## 2018-10-11 ENCOUNTER — Telehealth (HOSPITAL_COMMUNITY): Payer: Self-pay | Admitting: *Deleted

## 2018-10-11 NOTE — Telephone Encounter (Signed)
Called to notify patient that the cardiac and pulmonary rehabilitation department will be closed temporarily due to COVID-19 restrictions. Pt verbalized understanding.  Patient is practicing social distancing. Pt is walking a little bit, but is limited by neuropathy, right calf and feet pain with walking. Pt plans to try to walk more.  Sol Passer, MS, ACSM CEP 10/11/2018 1204

## 2018-10-13 ENCOUNTER — Ambulatory Visit (HOSPITAL_COMMUNITY): Payer: Medicare Other

## 2018-10-13 ENCOUNTER — Encounter (HOSPITAL_COMMUNITY): Payer: Medicare Other

## 2018-10-16 ENCOUNTER — Ambulatory Visit (HOSPITAL_COMMUNITY): Payer: Medicare Other

## 2018-10-16 ENCOUNTER — Encounter (HOSPITAL_COMMUNITY): Payer: Medicare Other

## 2018-10-18 ENCOUNTER — Encounter (HOSPITAL_COMMUNITY): Payer: Medicare Other

## 2018-10-18 ENCOUNTER — Ambulatory Visit (HOSPITAL_COMMUNITY): Payer: Medicare Other

## 2018-10-20 ENCOUNTER — Encounter (HOSPITAL_COMMUNITY): Payer: Medicare Other

## 2018-10-20 ENCOUNTER — Ambulatory Visit (HOSPITAL_COMMUNITY): Payer: Medicare Other

## 2018-10-23 ENCOUNTER — Ambulatory Visit (HOSPITAL_COMMUNITY): Payer: Medicare Other

## 2018-10-23 ENCOUNTER — Encounter (HOSPITAL_COMMUNITY): Payer: Medicare Other

## 2018-10-25 ENCOUNTER — Ambulatory Visit (HOSPITAL_COMMUNITY): Payer: Medicare Other

## 2018-10-25 ENCOUNTER — Encounter (HOSPITAL_COMMUNITY): Payer: Medicare Other

## 2018-10-27 ENCOUNTER — Ambulatory Visit (HOSPITAL_COMMUNITY): Payer: Medicare Other

## 2018-10-27 ENCOUNTER — Encounter (HOSPITAL_COMMUNITY): Payer: Medicare Other

## 2018-10-30 ENCOUNTER — Encounter (HOSPITAL_COMMUNITY): Payer: Medicare Other

## 2018-10-30 ENCOUNTER — Ambulatory Visit (HOSPITAL_COMMUNITY): Payer: Medicare Other

## 2018-11-01 ENCOUNTER — Encounter (HOSPITAL_COMMUNITY): Payer: Medicare Other

## 2018-11-01 ENCOUNTER — Ambulatory Visit (HOSPITAL_COMMUNITY): Payer: Medicare Other

## 2018-11-03 ENCOUNTER — Encounter (HOSPITAL_COMMUNITY): Payer: Medicare Other

## 2018-11-03 ENCOUNTER — Ambulatory Visit (HOSPITAL_COMMUNITY): Payer: Medicare Other

## 2018-11-06 ENCOUNTER — Ambulatory Visit (HOSPITAL_COMMUNITY): Payer: Medicare Other

## 2018-11-06 ENCOUNTER — Encounter (HOSPITAL_COMMUNITY): Payer: Medicare Other

## 2018-11-08 ENCOUNTER — Encounter (HOSPITAL_COMMUNITY): Payer: Medicare Other

## 2018-11-08 ENCOUNTER — Ambulatory Visit (HOSPITAL_COMMUNITY): Payer: Medicare Other

## 2018-11-10 ENCOUNTER — Encounter (HOSPITAL_COMMUNITY): Payer: Medicare Other

## 2018-11-10 ENCOUNTER — Ambulatory Visit (HOSPITAL_COMMUNITY): Payer: Medicare Other

## 2018-11-13 ENCOUNTER — Ambulatory Visit (HOSPITAL_COMMUNITY): Payer: Medicare Other

## 2018-11-13 ENCOUNTER — Encounter (HOSPITAL_COMMUNITY): Payer: Medicare Other

## 2018-11-15 ENCOUNTER — Ambulatory Visit (HOSPITAL_COMMUNITY): Payer: Medicare Other

## 2018-11-15 ENCOUNTER — Encounter (HOSPITAL_COMMUNITY): Payer: Medicare Other

## 2018-11-17 ENCOUNTER — Encounter (HOSPITAL_COMMUNITY): Payer: Medicare Other

## 2018-11-17 ENCOUNTER — Ambulatory Visit (HOSPITAL_COMMUNITY): Payer: Medicare Other

## 2018-11-20 ENCOUNTER — Ambulatory Visit (HOSPITAL_COMMUNITY): Payer: Medicare Other

## 2018-11-20 ENCOUNTER — Encounter (HOSPITAL_COMMUNITY): Payer: Medicare Other

## 2018-11-21 ENCOUNTER — Ambulatory Visit (HOSPITAL_COMMUNITY)
Admission: RE | Admit: 2018-11-21 | Discharge: 2018-11-21 | Disposition: A | Discharge status: Home / Self Care | Payer: Medicare Other | Source: Ambulatory Visit | Attending: Internal Medicine | Admitting: Internal Medicine

## 2018-11-21 ENCOUNTER — Other Ambulatory Visit: Payer: Self-pay

## 2018-11-21 DIAGNOSIS — I5022 Chronic systolic (congestive) heart failure: Secondary | ICD-10-CM

## 2018-11-21 DIAGNOSIS — I25810 Atherosclerosis of coronary artery bypass graft(s) without angina pectoris: Secondary | ICD-10-CM | POA: Diagnosis not present

## 2018-11-21 DIAGNOSIS — J449 Chronic obstructive pulmonary disease, unspecified: Secondary | ICD-10-CM | POA: Diagnosis not present

## 2018-11-21 DIAGNOSIS — I779 Disorder of arteries and arterioles, unspecified: Secondary | ICD-10-CM | POA: Diagnosis not present

## 2018-11-21 DIAGNOSIS — E785 Hyperlipidemia, unspecified: Secondary | ICD-10-CM | POA: Diagnosis not present

## 2018-11-21 DIAGNOSIS — N183 Chronic kidney disease, stage 3 (moderate): Secondary | ICD-10-CM | POA: Diagnosis not present

## 2018-11-21 LAB — LIPID PANEL
Cholesterol: 103 mg/dL (ref 0–200)
HDL: 27 mg/dL — ABNORMAL LOW (ref >40)
LDL Cholesterol: 41 mg/dL (ref 0–99)
Total CHOL/HDL Ratio: 3.8 RATIO
Triglycerides: 176 mg/dL — ABNORMAL HIGH (ref <150)
VLDL: 35 mg/dL (ref 0–40)

## 2018-11-22 ENCOUNTER — Ambulatory Visit (HOSPITAL_COMMUNITY): Payer: Medicare Other

## 2018-11-22 ENCOUNTER — Encounter (HOSPITAL_COMMUNITY): Payer: Medicare Other

## 2018-11-24 ENCOUNTER — Ambulatory Visit (HOSPITAL_COMMUNITY): Payer: Medicare Other

## 2018-11-24 ENCOUNTER — Encounter (HOSPITAL_COMMUNITY): Payer: Medicare Other

## 2018-11-29 ENCOUNTER — Ambulatory Visit (HOSPITAL_COMMUNITY): Payer: Medicare Other

## 2018-11-29 ENCOUNTER — Encounter (HOSPITAL_COMMUNITY): Payer: Medicare Other

## 2018-11-30 ENCOUNTER — Telehealth (HOSPITAL_COMMUNITY): Payer: Self-pay

## 2018-11-30 NOTE — Telephone Encounter (Signed)
Phone call made to Pt to provide information about virtual cardiac rehab. Pt was responsive. Pt stated he did not have a smart device and did not want handout materials mailed to him. Pt stated he was exercising at home by walking and using a stationary pedal machine. Pt expressed he would like to return to program once we reopen.

## 2018-12-01 ENCOUNTER — Encounter (HOSPITAL_COMMUNITY): Payer: Medicare Other

## 2018-12-04 ENCOUNTER — Encounter (HOSPITAL_COMMUNITY): Payer: Medicare Other

## 2018-12-15 ENCOUNTER — Telehealth (HOSPITAL_COMMUNITY): Payer: Self-pay | Admitting: Licensed Clinical Social Worker

## 2018-12-15 ENCOUNTER — Other Ambulatory Visit (HOSPITAL_COMMUNITY): Payer: Self-pay

## 2018-12-15 MED ORDER — ATORVASTATIN CALCIUM 40 MG PO TABS: 40.0000 mg | ORAL_TABLET | Freq: Every day | ORAL | 3 refills | Status: DC

## 2018-12-15 MED ORDER — SPIRONOLACTONE 25 MG PO TABS: 25.0000 mg | ORAL_TABLET | Freq: Every day | ORAL | 3 refills | Status: DC

## 2018-12-15 MED ORDER — CARVEDILOL 12.5 MG PO TABS: 18.7500 mg | ORAL_TABLET | Freq: Two times a day (BID) | ORAL | 3 refills | Status: DC

## 2018-12-15 MED ORDER — ENTRESTO 97-103 MG PO TABS: 1.0000 | ORAL_TABLET | Freq: Two times a day (BID) | ORAL | 6 refills | Status: DC

## 2018-12-15 NOTE — Telephone Encounter (Signed)
CSW consulted to help with pt medication cost concerns for Vascepa.  CSW discussed with patient who states it is currently costing him $167 and he hasn't even entered into the donut hole yet so he is very concerned about future costs.  CSW informed pt of [employer] which could cover this medication copay costs- pt agreeable to applying.    Pt approved:  Pharmacy Card Id [id number] Group [id number] PCN [id number]  Fatima Sanger good for $2,500 Expires: 11/14/19  CSW provided information to pt pharmacy who confirmed that it worked- pt updated  [facility] will continue to follow and assist as needed  Jorge Ny, [place] Worker [facility] Desk#: [phone] Cell#: [phone]

## 2018-12-19 ENCOUNTER — Telehealth (HOSPITAL_COMMUNITY): Payer: Self-pay | Admitting: Licensed Clinical Social Worker

## 2018-12-19 NOTE — Telephone Encounter (Signed)
CSW called pt to follow up on getting him assistance for his Entresto copays as he reports it is $150/month  Pt provided CSW with his RX coverage information:  [place] ID: [id number] [place]: [id number] group: [id number]  Provider Line: [phone]  And CSW requested clinic staff to request a tier exception be completed to see if this would help bring down copay cost.  CSW also requested [employer] application be sent.  Confirmed pt has plenty of Entresto to last him at this time.  CSW will continue to follow and assist as needed  Robert [name], [facility] Desk#: [phone] Cell#: [phone]

## 2018-12-21 ENCOUNTER — Telehealth (HOSPITAL_COMMUNITY): Payer: Self-pay | Admitting: Licensed Clinical Social Worker

## 2018-12-21 NOTE — Telephone Encounter (Signed)
CSW called pt Rx insurance to inquire about tier exception.  Per insurance patient currently not eligible for tier exception as he is in the coverage gap.  CSW called pt and updated- made pt aware that at this point best option would be for pt to complete [employer] application that is being sent by the clinic to see if he can get medication through that program.  CSW will continue to follow and assist as needed  Jorge Ny, [facility] Desk#: [phone] Cell#: [phone]

## 2018-12-29 ENCOUNTER — Telehealth (HOSPITAL_COMMUNITY): Payer: Self-pay | Admitting: *Deleted

## 2018-12-29 NOTE — Telephone Encounter (Signed)
PC to pt to confirm interest in returning to cardiac rehab and of requirement to wear masks if returning.  No answer. VM left.

## 2019-01-01 ENCOUNTER — Telehealth (HOSPITAL_COMMUNITY): Payer: Self-pay | Admitting: *Deleted

## 2019-01-01 NOTE — Telephone Encounter (Signed)
-----   Message from Larey Dresser, MD sent at 01/01/2019 12:44 PM EDT ----- Regarding: RE: Ok to return to on site cardiac rehab yes ----- Message ----- From: Rowe Pavy, RN Sent: 01/01/2019  12:13 PM EDT To: Larey Dresser, MD Subject: Ok to return to on site cardiac rehab           Dr.   Harley [place] are preparing for current patients to phase in reentry to in facility cardiac rehab. A great deal of planning with advisement from our Medical Director - Dr. Radford Pax, CV Service line leadership, Infection Disease Control, Facilities, security,recommendations from American Association of Cardiac and Pulmonary Rehab (AACVPR) with the goal for optimal patient safety. Patients will have strict guidelines and criteria they must adhere and follow.  Pt will have to complete screenings prior to their scheduled appointment and  again prior to entry into gym area.  Your pt, Robert [name]  expressed great interest in returning to in facility cardiac rehab. Pt has a Covid risk score  7 (high).  Do you feel this pt is appropriate to resume in facility cardiac rehab?  Any additional restrictions you feel are appropriate for this pt?   Thank you and we appreciate your valued input.  Cherre Huger, BSN Cardiac and Training and development officer

## 2019-01-02 ENCOUNTER — Telehealth (HOSPITAL_COMMUNITY): Payer: Self-pay

## 2019-01-02 NOTE — Telephone Encounter (Signed)
Pt returned CR phone call, he will come in for CR 01/08/2019 @ 11AM.

## 2019-01-02 NOTE — Telephone Encounter (Signed)
Called and spoke with pt wife Apolonio Schneiders who stated pt was on the phone and will have him return our call later.

## 2019-01-08 ENCOUNTER — Other Ambulatory Visit: Payer: Self-pay

## 2019-01-08 ENCOUNTER — Encounter (HOSPITAL_COMMUNITY)
Admission: RE | Admit: 2019-01-08 | Discharge: 2019-01-08 | Disposition: A | Discharge status: Home / Self Care | Payer: Medicare Other | Source: Ambulatory Visit | Attending: Cardiology | Admitting: Cardiology

## 2019-01-08 DIAGNOSIS — Z951 Presence of aortocoronary bypass graft: Secondary | ICD-10-CM | POA: Diagnosis not present

## 2019-01-08 NOTE — Progress Notes (Signed)
Daily Session Note  Patient Details  Name: Robert [name] MRN: [id number] Date of Birth: Sep 30, 1940 Referring Provider:     CARDIAC REHAB PHASE II ORIENTATION from 08/22/2018 in [place]  Referring Provider  [name], MD      Encounter Date: 01/08/2019  Check In: Session Check In - 01/08/19 1058      Check-In   Supervising physician immediately available to respond to emergencies  [facility] immediately available    Physician(s)  Dr. Posey Pronto    Location  [facility]    Staff Present  Rosebud Poles, RN, Toma Deiters, RN, Mosie Epstein, MS,ACSM CEP, Exercise Physiologist;Brittany Durene Fruits, BS, ACSM CEP, Exercise Physiologist    Virtual Visit  No    Medication changes reported      No    Fall or balance concerns reported     No    Tobacco Cessation  No Change    Warm-up and Cool-down  Performed on first and last piece of equipment    Resistance Training Performed  Yes    VAD Patient?  No    PAD/SET Patient?  No      Pain Assessment   Currently in Pain?  No/denies    Multiple Pain Sites  No       Capillary Blood Glucose: No results found for this or any previous visit (from the past 24 hour(s)).  Exercise Prescription Changes - 01/08/19 1100      Response to Exercise   Blood Pressure (Admit)  144/54    Blood Pressure (Exercise)  142/70    Blood Pressure (Exit)  120/72    Heart Rate (Admit)  84 bpm    Heart Rate (Exercise)  89 bpm    Heart Rate (Exit)  87 bpm    Rating of Perceived Exertion (Exercise)  12    Perceived Dyspnea (Exercise)  0    Symptoms  None    Comments  Pt first exercsie session since department closure dut to COVID 19.    Duration  Progress to 30 minutes of  aerobic without signs/symptoms of physical distress    Intensity  THRR unchanged      Progression   Progression  Continue to progress workloads to maintain intensity without signs/symptoms of physical distress.    Average METs   2.4      Resistance Training   Training Prescription  Yes    Weight  3lbs    Reps  10-15    Time  10 Minutes      NuStep   Level  3    SPM  85    Minutes  15    METs  2.4      Arm Ergometer   Level  3    Watts  9    Minutes  15    METs  2.41       Social History   Tobacco Use  Smoking Status Former Smoker  . Packs/day: 2.00  . Years: 52.00  . Pack years: 104.00  . Types: Cigarettes  . Quit date: 12/18/2017  . Years since quitting: 1.0  Smokeless Tobacco Never Used    Goals Met:  Exercise tolerated well  Goals Unmet:  Not Applicable  Comments: Patient returned to cardiac rehab after department closure due to COVID 19. Social distancing and safety measures are in place. Patient tolerated his first day of exercise without difficulty. VSS. Telemetry: NSR.   Dr. Fransico Him is Medical  Director for Cardiac Rehab at [facility]

## 2019-01-10 ENCOUNTER — Encounter (HOSPITAL_COMMUNITY)
Admission: RE | Admit: 2019-01-10 | Discharge: 2019-01-10 | Disposition: A | Discharge status: Home / Self Care | Payer: Medicare Other | Source: Ambulatory Visit | Attending: Cardiology | Admitting: Cardiology

## 2019-01-10 ENCOUNTER — Other Ambulatory Visit: Payer: Self-pay

## 2019-01-10 DIAGNOSIS — Z951 Presence of aortocoronary bypass graft: Secondary | ICD-10-CM

## 2019-01-12 ENCOUNTER — Encounter (HOSPITAL_COMMUNITY)
Admission: RE | Admit: 2019-01-12 | Discharge: 2019-01-12 | Disposition: A | Discharge status: Home / Self Care | Payer: Medicare Other | Source: Ambulatory Visit | Attending: Cardiology | Admitting: Cardiology

## 2019-01-12 ENCOUNTER — Other Ambulatory Visit: Payer: Self-pay

## 2019-01-12 DIAGNOSIS — Z951 Presence of aortocoronary bypass graft: Secondary | ICD-10-CM

## 2019-01-15 ENCOUNTER — Encounter (HOSPITAL_COMMUNITY)
Admission: RE | Admit: 2019-01-15 | Discharge: 2019-01-15 | Disposition: A | Discharge status: Home / Self Care | Payer: Medicare Other | Source: Ambulatory Visit | Attending: Cardiology | Admitting: Cardiology

## 2019-01-15 ENCOUNTER — Other Ambulatory Visit: Payer: Self-pay

## 2019-01-15 DIAGNOSIS — Z951 Presence of aortocoronary bypass graft: Secondary | ICD-10-CM | POA: Diagnosis not present

## 2019-01-17 ENCOUNTER — Other Ambulatory Visit: Payer: Self-pay

## 2019-01-17 ENCOUNTER — Encounter (HOSPITAL_COMMUNITY)
Admission: RE | Admit: 2019-01-17 | Discharge: 2019-01-17 | Disposition: A | Discharge status: Home / Self Care | Payer: Medicare Other | Source: Ambulatory Visit | Attending: Cardiology | Admitting: Cardiology

## 2019-01-17 DIAGNOSIS — Z951 Presence of aortocoronary bypass graft: Secondary | ICD-10-CM

## 2019-01-19 ENCOUNTER — Encounter (HOSPITAL_COMMUNITY)
Admission: RE | Admit: 2019-01-19 | Discharge: 2019-01-19 | Disposition: A | Discharge status: Home / Self Care | Payer: Medicare Other | Source: Ambulatory Visit | Attending: Cardiology | Admitting: Cardiology

## 2019-01-19 ENCOUNTER — Other Ambulatory Visit: Payer: Self-pay

## 2019-01-19 DIAGNOSIS — Z951 Presence of aortocoronary bypass graft: Secondary | ICD-10-CM

## 2019-01-22 ENCOUNTER — Encounter (HOSPITAL_COMMUNITY)
Admission: RE | Admit: 2019-01-22 | Discharge: 2019-01-22 | Disposition: A | Discharge status: Home / Self Care | Payer: Medicare Other | Source: Ambulatory Visit | Attending: Cardiology | Admitting: Cardiology

## 2019-01-22 ENCOUNTER — Other Ambulatory Visit: Payer: Self-pay

## 2019-01-22 DIAGNOSIS — Z951 Presence of aortocoronary bypass graft: Secondary | ICD-10-CM

## 2019-01-24 ENCOUNTER — Encounter (HOSPITAL_COMMUNITY)
Admission: RE | Admit: 2019-01-24 | Discharge: 2019-01-24 | Disposition: A | Discharge status: Home / Self Care | Payer: Medicare Other | Source: Ambulatory Visit | Attending: Cardiology | Admitting: Cardiology

## 2019-01-24 ENCOUNTER — Other Ambulatory Visit: Payer: Self-pay

## 2019-01-24 DIAGNOSIS — Z951 Presence of aortocoronary bypass graft: Secondary | ICD-10-CM

## 2019-01-25 NOTE — Progress Notes (Signed)
Cardiac Individual Treatment Plan  Patient Details  Name: Robert [name] MRN: [id number] Date of Birth: 02/28/1941 Referring Provider:     CARDIAC REHAB PHASE II ORIENTATION from 08/22/2018 in [facility]  Referring Provider  [name], MD      Initial Encounter Date:    CARDIAC REHAB PHASE II ORIENTATION from 08/22/2018 in [facility]  Date  08/22/18      Visit Diagnosis: 03/16/18 CABG x4  Patient's Home Medications on Admission:  Current Outpatient Medications:  .  acetaminophen (TYLENOL) 500 MG tablet, Take 500 mg by mouth at bedtime., Disp: , Rfl:  .  aspirin EC 325 MG EC tablet, Take 1 tablet (325 mg total) by mouth daily., Disp: 30 tablet, Rfl: 0 .  atorvastatin (LIPITOR) 40 MG tablet, Take 1 tablet (40 mg total) by mouth at bedtime., Disp: 90 tablet, Rfl: 3 .  benzonatate (TESSALON) 200 MG capsule, , Disp: , Rfl: 0 .  carvedilol (COREG) 12.5 MG tablet, Take 1.5 tablets (18.75 mg total) by mouth 2 (two) times daily., Disp: 90 tablet, Rfl: 3 .  Icosapent Ethyl 1 g CAPS, Take 2 capsules (2 g total) by mouth 2 (two) times daily., Disp: 180 capsule, Rfl: 3 .  omeprazole (PRILOSEC) 20 MG capsule, Take 1 capsule (20 mg total) by mouth daily. (Patient not taking: Reported on 01/10/2019), Disp: 30 capsule, Rfl: 6 .  Polyethyl Glycol-Propyl Glycol (SYSTANE) 0.4-0.3 % SOLN, Place 1 drop into both eyes 2 (two) times daily., Disp: , Rfl:  .  prednisoLONE acetate (PRED MILD) 0.12 % ophthalmic suspension, Place 1 drop into the right eye 4 (four) times daily., Disp: , Rfl:  .  sacubitril-valsartan (ENTRESTO) 97-103 MG, Take 1 tablet by mouth 2 (two) times daily., Disp: 60 tablet, Rfl: 6 .  spironolactone (ALDACTONE) 25 MG tablet, Take 1 tablet (25 mg total) by mouth daily., Disp: 30 tablet, Rfl: 3  Past Medical History: Past Medical History:  Diagnosis Date  . Childhood asthma   . Coronary artery disease   . Hay fever    . Hypertension   . Macular hole of left eye 12/23/2011  . Macular hole, right eye 03/05/2014  . Myocardial infarction [facility])    June 2019  . Neuromuscular disorder (HCC)    numbness in feet  . PONV (postoperative nausea and vomiting)   . Presence of external cardiac defibrillator 2019  . 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 Sinus headache    "hay fever season; not that often"  . Transverse myelitis ([facility]) 2009   was treated by Dr Erling Cruz  . Umbilical hernia     Tobacco Use: Social History   Tobacco Use  Smoking Status Former Smoker  . Packs/day: 2.00  . Years: 52.00  . Pack years: 104.00  . Types: Cigarettes  . Quit date: 12/18/2017  . Years since quitting: 1.1  Smokeless Tobacco Never Used    Labs: Recent Review Flowsheet Data    Labs for ITP Cardiac and Pulmonary Rehab Latest Ref Rng & Units 03/19/2018 03/20/2018 03/21/2018 09/18/2018 11/21/2018   Cholestrol 0 - 200 mg/dL - - - 107 103   LDLCALC 0 - 99 mg/dL - - - 8 41   HDL >40 mg/dL - - - 29(L) 27(L)   Trlycerides <150 mg/dL - - - 351(H) 176(H)   Hemoglobin A1c 4.8 - 5.6 % - - - - -   PHART 7.350 - 7.450 - - - - -  PCO2ART 32.0 - 48.0 mmHg - - - - -   HCO3 20.0 - 28.0 mmol/L - - - - -   TCO2 22 - 32 mmol/L 27 - - - -   ACIDBASEDEF 0.0 - 2.0 mmol/L - - - - -   O2SAT % 86.1 53.1 65.6 - -      Capillary Blood Glucose: Lab Results  Component Value Date   GLUCAP 143 (H) 03/25/2018   GLUCAP 94 03/25/2018   GLUCAP 110 (H) 03/24/2018   GLUCAP 104 (H) 03/24/2018   GLUCAP 118 (H) 03/24/2018     Exercise Target Goals: Exercise Program Goal: Individual exercise prescription set using results from initial 6 min walk test and THRR while considering  patient's activity barriers and safety.   Exercise Prescription Goal: Initial exercise prescription builds to 30-45 minutes a day of aerobic activity, 2-3 days per week.  Home exercise guidelines will be given to patient during program as part of exercise  prescription that the participant will acknowledge.  Activity Barriers & Risk Stratification: Activity Barriers & Cardiac Risk Stratification - 08/22/18 1344      Activity Barriers & Cardiac Risk Stratification   Activity Barriers  Other (comment)    Comments  Right knee "issues".    Cardiac Risk Stratification  High       6 Minute Walk: 6 Minute Walk    Row Name 08/22/18 1409         6 Minute Walk   Phase  Initial     Distance  1340 feet     Walk Time  6 minutes     # of Rest Breaks  0     MPH  2.54     METS  2.61     RPE  11     Perceived Dyspnea   0     VO2 Peak  9.13     Symptoms  No     Resting HR  67 bpm     Resting BP  138/80     Resting Oxygen Saturation   95 %     Exercise Oxygen Saturation  during 6 min walk  97 %     Max Ex. HR  92 bpm     Max Ex. BP  148/72     2 Minute Post BP  122/68        Oxygen Initial Assessment:   Oxygen Re-Evaluation:   Oxygen Discharge (Final Oxygen Re-Evaluation):   Initial Exercise Prescription: Initial Exercise Prescription - 08/22/18 1500      Date of Initial Exercise RX and Referring Provider   Date  08/22/18    Referring Provider  [name], MD    Expected Discharge Date  11/24/18      Bike   Level  0.8    Minutes  10    METs  2.64      NuStep   Level  2    SPM  85    Minutes  10    METs  2.5      Track   Laps  9    Minutes  10    METs  2.57      Prescription Details   Frequency (times per week)  3    Duration  Progress to 30 minutes of continuous aerobic without signs/symptoms of physical distress      Intensity   THRR 40-80% of Max Heartrate  58-116    Ratings of Perceived Exertion  11-13  Perceived Dyspnea  0-4      Progression   Progression  Continue to progress workloads to maintain intensity without signs/symptoms of physical distress.      Resistance Training   Training Prescription  Yes    Weight  3lbs    Reps  10-15       Perform Capillary Blood Glucose checks as  needed.  Exercise Prescription Changes: Exercise Prescription Changes    Row Name 08/28/18 1400 01/08/19 1100 01/24/19 1300         Response to Exercise   Blood Pressure (Admit)  102/60  144/54  134/54     Blood Pressure (Exercise)  124/80  142/70  138/74     Blood Pressure (Exit)  112/60  120/72  118/68     Heart Rate (Admit)  81 bpm  84 bpm  70 bpm     Heart Rate (Exercise)  102 bpm  89 bpm  90 bpm     Heart Rate (Exit)  87 bpm  87 bpm  76 bpm     Rating of Perceived Exertion (Exercise)  12  12  12      Perceived Dyspnea (Exercise)  0  0  0     Symptoms  None  None  None     Comments  Pt oriented to exercise equipment  Pt first exercsie session since department closure dut to COVID 19.  -     Duration  Progress to 30 minutes of  aerobic without signs/symptoms of physical distress  Progress to 30 minutes of  aerobic without signs/symptoms of physical distress  Progress to 30 minutes of  aerobic without signs/symptoms of physical distress     Intensity  THRR unchanged  THRR unchanged  THRR unchanged       Progression   Progression  Continue to progress workloads to maintain intensity without signs/symptoms of physical distress.  Continue to progress workloads to maintain intensity without signs/symptoms of physical distress.  Continue to progress workloads to maintain intensity without signs/symptoms of physical distress.     Average METs  2.32  2.4  2.5       Resistance Training   Training Prescription  Yes  Yes  No     Weight  3lbs  3lbs  -     Reps  10-15  10-15  -     Time  10 Minutes  10 Minutes  -       Bike   Level  0.8  -  -     Minutes  10  -  -     METs  2.64  -  -       NuStep   Level  2  3  4      SPM  85  85  85     Minutes  10  15  15      METs  2  2.4  2.6       Arm Ergometer   Level  -  3  3     Watts  -  9  25     Minutes  -  15  15     METs  -  2.41  2.41       Track   Laps  6  -  -     Minutes  10  -  -     METs  2  -  -       Home Exercise Plan  Plans to continue exercise at  -  -  Home (comment)     Frequency  -  -  Add 2 additional days to program exercise sessions.     Initial Home Exercises Provided  -  -  01/24/19        Exercise Comments: Exercise Comments    Row Name 08/28/18 1422 01/08/19 1156 01/24/19 1337       Exercise Comments  Pt's first day of exercise. Pt oriented to exercise equipment. Pt responded well to workloads. Will continue to monitor and progress as tolerated.  Pt tolerated exercise session well.  Reviewed goals and home exercise with Pt. Pt expressed understanding.        Exercise Goals and Review: Exercise Goals    Row Name 08/22/18 1344             Exercise Goals   Increase Physical Activity  Yes       Intervention  Provide advice, education, support and counseling about physical activity/exercise needs.;Develop an individualized exercise prescription for aerobic and resistive training based on initial evaluation findings, risk stratification, comorbidities and participant's personal goals.       Expected Outcomes  Short Term: Attend rehab on a regular basis to increase amount of physical activity.;Long Term: Add in home exercise to make exercise part of routine and to increase amount of physical activity.;Long Term: Exercising regularly at least 3-5 days a week.       Increase Strength and Stamina  Yes       Intervention  Provide advice, education, support and counseling about physical activity/exercise needs.;Develop an individualized exercise prescription for aerobic and resistive training based on initial evaluation findings, risk stratification, comorbidities and participant's personal goals.       Expected Outcomes  Short Term: Increase workloads from initial exercise prescription for resistance, speed, and METs.;Short Term: Perform resistance training exercises routinely during rehab and add in resistance training at home;Long Term: Improve cardiorespiratory fitness, muscular endurance and  strength as measured by increased METs and functional capacity (6MWT)       Able to understand and use rate of perceived exertion (RPE) scale  Yes       Intervention  Provide education and explanation on how to use RPE scale       Expected Outcomes  Short Term: Able to use RPE daily in rehab to express subjective intensity level;Long Term:  Able to use RPE to guide intensity level when exercising independently       Knowledge and understanding of Target Heart Rate Range (THRR)  Yes       Intervention  Provide education and explanation of THRR including how the numbers were predicted and where they are located for reference       Expected Outcomes  Short Term: Able to state/look up THRR;Long Term: Able to use THRR to govern intensity when exercising independently;Short Term: Able to use daily as guideline for intensity in rehab       Able to check pulse independently  Yes       Intervention  Provide education and demonstration on how to check pulse in carotid and radial arteries.;Review the importance of being able to check your own pulse for safety during independent exercise       Expected Outcomes  Short Term: Able to explain why pulse checking is important during independent exercise;Long Term: Able to check pulse independently and accurately       Understanding of Exercise Prescription  Yes  Intervention  Provide education, explanation, and written materials on patient's individual exercise prescription       Expected Outcomes  Short Term: Able to explain program exercise prescription;Long Term: Able to explain home exercise prescription to exercise independently          Exercise Goals Re-Evaluation : Exercise Goals Re-Evaluation    Row Name 01/08/19 1155 01/24/19 1335           Exercise Goal Re-Evaluation   Exercise Goals Review  Increase Physical Activity;Understanding of Exercise Prescription;Increase Strength and Stamina;Knowledge and understanding of Target Heart Rate Range  (THRR);Able to understand and use rate of perceived exertion (RPE) scale;Able to check pulse independently  Increase Physical Activity;Increase Strength and Stamina;Able to understand and use rate of perceived exertion (RPE) scale;Knowledge and understanding of Target Heart Rate Range (THRR);Able to check pulse independently;Understanding of Exercise Prescription      Comments  Pt first exercise session since department closure due to COVID-19. Pt tolerated exercise well.  Reviewed goals and home exercise with Pt. Pt is not currently exercising at home. Pt stated he is working his job 2 days per week in addition to CR. Pt was encouraged to walk 30 minutes two days a week in addition to CR. Pt stated understanding and willingness to try.      Expected Outcomes  Will continue to monitor and progress Pt as tolerated.  Will continue to monitor and progress Pt as tolerated.         Discharge Exercise Prescription (Final Exercise Prescription Changes): Exercise Prescription Changes - 01/24/19 1300      Response to Exercise   Blood Pressure (Admit)  134/54    Blood Pressure (Exercise)  138/74    Blood Pressure (Exit)  118/68    Heart Rate (Admit)  70 bpm    Heart Rate (Exercise)  90 bpm    Heart Rate (Exit)  76 bpm    Rating of Perceived Exertion (Exercise)  12    Perceived Dyspnea (Exercise)  0    Symptoms  None    Duration  Progress to 30 minutes of  aerobic without signs/symptoms of physical distress    Intensity  THRR unchanged      Progression   Progression  Continue to progress workloads to maintain intensity without signs/symptoms of physical distress.    Average METs  2.5      Resistance Training   Training Prescription  No      NuStep   Level  4    SPM  85    Minutes  15    METs  2.6      Arm Ergometer   Level  3    Watts  25    Minutes  15    METs  2.41      Home Exercise Plan   Plans to continue exercise at  Home (comment)    Frequency  Add 2 additional days to program  exercise sessions.    Initial Home Exercises Provided  01/24/19       Nutrition:  Target Goals: Understanding of nutrition guidelines, daily intake of sodium 1500mg , cholesterol 200mg , calories 30% from fat and 7% or less from saturated fats, daily to have 5 or more servings of fruits and vegetables.  Biometrics: Pre Biometrics - 08/22/18 1334      Pre Biometrics   Height  5\' 9"  (1.753 m)    Weight  92.5 kg    Waist Circumference  43 inches  Hip Circumference  44.8 inches    Waist to Hip Ratio  0.96 %    BMI (Calculated)  30.1    Triceps Skinfold  15 mm    % Body Fat  30 %    Grip Strength  36 kg    Flexibility  0 in    Single Leg Stand  4 seconds        Nutrition Therapy Plan and Nutrition Goals: Nutrition Therapy & Goals - 08/23/18 1331      Nutrition Therapy   Diet  heart healthy, carb modified      Personal Nutrition Goals   Nutrition Goal  Pt to identify and limit food sources of saturated fat, trans fat, refined carbohydrates and sodium    Personal Goal #2  Pt able to name foods that affect blood glucose.    Personal Goal #3  Pt able to name foods that affect blood glucose.    Personal Goal #4  Pt to eat a variety of non-starchy vegetables      Intervention Plan   Intervention  Prescribe, educate and counsel regarding individualized specific dietary modifications aiming towards targeted core components such as weight, hypertension, lipid management, diabetes, heart failure and other comorbidities.    Expected Outcomes  Short Term Goal: Understand basic principles of dietary content, such as calories, fat, sodium, cholesterol and nutrients.;Long Term Goal: Adherence to prescribed nutrition plan.       Nutrition Assessments: Nutrition Assessments - 08/23/18 1331      MEDFICTS Scores   Pre Score  56       Nutrition Goals Re-Evaluation:   Nutrition Goals Re-Evaluation:   Nutrition Goals Discharge (Final Nutrition Goals  Re-Evaluation):   Psychosocial: Target Goals: Acknowledge presence or absence of significant depression and/or stress, maximize coping skills, provide positive support system. Participant is able to verbalize types and ability to use techniques and skills needed for reducing stress and depression.  Initial Review & Psychosocial Screening: Initial Psych Review & Screening - 08/22/18 1524      Initial Review   Current issues with  None Identified      Family Dynamics   Good Support System?  Yes   wife     Barriers   Psychosocial barriers to participate in program  There are no identifiable barriers or psychosocial needs.      Screening Interventions   Interventions  Encouraged to exercise       Quality of Life Scores: Quality of Life - 08/22/18 1423      Quality of Life   Select  Quality of Life      Quality of Life Scores   Health/Function Pre  24.1 %    Socioeconomic Pre  24.43 %    Psych/Spiritual Pre  26.79 %    Family Pre  27 %    GLOBAL Pre  25.09 %      Scores of 19 and below usually indicate a poorer quality of life in these areas.  A difference of  2-3 points is a clinically meaningful difference.  A difference of 2-3 points in the total score of the Quality of Life Index has been associated with significant improvement in overall quality of life, self-image, physical symptoms, and general health in studies assessing change in quality of life.  PHQ-9: Recent Review Flowsheet Data    Depression screen [facility] 2/9 08/28/2018 05/24/2018   Decreased Interest 0 0   Down, Depressed, Hopeless 0 0   PHQ - 2 Score 0  0     Interpretation of Total Score  Total Score Depression Severity:  1-4 = Minimal depression, 5-9 = Mild depression, 10-14 = Moderate depression, 15-19 = Moderately severe depression, 20-27 = Severe depression   Psychosocial Evaluation and Intervention: Psychosocial Evaluation - 08/28/18 1419      Psychosocial Evaluation & Interventions   Interventions   Encouraged to exercise with the program and follow exercise prescription    Comments  No psychosocial interventions necessary.  Taahir enjoys watching TV.     Expected Outcomes  Izzy will continue to exhibit a positive outlook with good coping skills.     Continue Psychosocial Services   No Follow up required       Psychosocial Re-Evaluation: Psychosocial Re-Evaluation    [place] Name 09/21/18 2683 01/22/19 0919           Psychosocial Re-Evaluation   Current issues with  None Identified  None Identified      Comments  No psychosocial interventions necessary.  No psychosocial interventions necessary.      Expected Outcomes  Elijha will maintain a positive outlook with good coping skills.   Tirth will maintain a positive outlook with good coping skills.       Interventions  Encouraged to attend Cardiac Rehabilitation for the exercise  Encouraged to attend Cardiac Rehabilitation for the exercise      Continue Psychosocial Services   No Follow up required  No Follow up required         Psychosocial Discharge (Final Psychosocial Re-Evaluation): Psychosocial Re-Evaluation - 01/22/19 0919      Psychosocial Re-Evaluation   Current issues with  None Identified    Comments  No psychosocial interventions necessary.    Expected Outcomes  Caedon will maintain a positive outlook with good coping skills.     Interventions  Encouraged to attend Cardiac Rehabilitation for the exercise    Continue Psychosocial Services   No Follow up required       Vocational Rehabilitation: Provide vocational rehab assistance to qualifying candidates.   Vocational Rehab Evaluation & Intervention: Vocational Rehab - 08/22/18 1422      Initial Vocational Rehab Evaluation & Intervention   Assessment shows need for Vocational Rehabilitation  No       Education: Education Goals: Education classes will be provided on a weekly basis, covering required topics. Participant will state understanding/return demonstration  of topics presented.  Learning Barriers/Preferences: Learning Barriers/Preferences - 08/22/18 1346      Learning Barriers/Preferences   Learning Barriers  Sight    Learning Preferences  Skilled Demonstration;Written Material;Group Instruction;Individual Instruction       Education Topics: Count Your Pulse:  -Group instruction provided by verbal instruction, demonstration, patient participation and written materials to support subject.  Instructors address importance of being able to find your pulse and how to count your pulse when at home without a heart monitor.  Patients get hands on experience counting their pulse with staff help and individually.   CARDIAC REHAB PHASE II EXERCISE from 09/15/2018 in [facility]  Date  09/01/18  Educator  RN  Instruction Review Code  2- Demonstrated Understanding      Heart Attack, Angina, and Risk Factor Modification:  -Group instruction provided by verbal instruction, video, and written materials to support subject.  Instructors address signs and symptoms of angina and heart attacks.    Also discuss risk factors for heart disease and how to make changes to improve heart health risk factors.  Functional Fitness:  -Group instruction provided by verbal instruction, demonstration, patient participation, and written materials to support subject.  Instructors address safety measures for doing things around the house.  Discuss how to get up and down off the floor, how to pick things up properly, how to safely get out of a chair without assistance, and balance training.   Meditation and Mindfulness:  -Group instruction provided by verbal instruction, patient participation, and written materials to support subject.  Instructor addresses importance of mindfulness and meditation practice to help reduce stress and improve awareness.  Instructor also leads participants through a meditation exercise.    CARDIAC REHAB PHASE II  EXERCISE from 09/15/2018 in [facility]  Date  09/06/18  Educator  RN  Instruction Review Code  2- Demonstrated Understanding      Stretching for Flexibility and Mobility:  -Group instruction provided by verbal instruction, patient participation, and written materials to support subject.  Instructors lead participants through series of stretches that are designed to increase flexibility thus improving mobility.  These stretches are additional exercise for major muscle groups that are typically performed during regular warm up and cool down.   Hands Only CPR:  -Group verbal, video, and participation provides a basic overview of AHA guidelines for community CPR. Role-play of emergencies allow participants the opportunity to practice calling for help and chest compression technique with discussion of AED use.   Hypertension: -Group verbal and written instruction that provides a basic overview of hypertension including the most recent diagnostic guidelines, risk factor reduction with self-care instructions and medication management.   CARDIAC REHAB PHASE II EXERCISE from 09/15/2018 in [place]  Date  09/15/18  Instruction Review Code  2- Demonstrated Understanding       Nutrition I class: Heart Healthy Eating:  -Group instruction provided by PowerPoint slides, verbal discussion, and written materials to support subject matter. The instructor gives an explanation and review of the Therapeutic Lifestyle Changes diet recommendations, which includes a discussion on lipid goals, dietary fat, sodium, fiber, plant stanol/sterol esters, sugar, and the components of a well-balanced, healthy diet.   Nutrition II class: Lifestyle Skills:  -Group instruction provided by PowerPoint slides, verbal discussion, and written materials to support subject matter. The instructor gives an explanation and review of label reading, grocery shopping for  heart health, heart healthy recipe modifications, and ways to make healthier choices when eating out.   Diabetes Question & Answer:  -Group instruction provided by PowerPoint slides, verbal discussion, and written materials to support subject matter. The instructor gives an explanation and review of diabetes co-morbidities, pre- and post-prandial blood glucose goals, pre-exercise blood glucose goals, signs, symptoms, and treatment of hypoglycemia and hyperglycemia, and foot care basics.   Diabetes Blitz:  -Group instruction provided by PowerPoint slides, verbal discussion, and written materials to support subject matter. The instructor gives an explanation and review of the physiology behind type 1 and type 2 diabetes, diabetes medications and rational behind using different medications, pre- and post-prandial blood glucose recommendations and Hemoglobin A1c goals, diabetes diet, and exercise including blood glucose guidelines for exercising safely.    Portion Distortion:  -Group instruction provided by PowerPoint slides, verbal discussion, written materials, and food models to support subject matter. The instructor gives an explanation of serving size versus portion size, changes in portions sizes over the last 20 years, and what consists of a serving from each food group.   Stress Management:  -Group instruction provided by  verbal instruction, video, and written materials to support subject matter.  Instructors review role of stress in heart disease and how to cope with stress positively.     Exercising on Your Own:  -Group instruction provided by verbal instruction, power point, and written materials to support subject.  Instructors discuss benefits of exercise, components of exercise, frequency and intensity of exercise, and end points for exercise.  Also discuss use of nitroglycerin and activating EMS.  Review options of places to exercise outside of rehab.  Review guidelines for sex with  heart disease.   CARDIAC REHAB PHASE II EXERCISE from 09/15/2018 in [place]  Date  08/30/18  Educator  EP  Instruction Review Code  2- Demonstrated Understanding      Cardiac Drugs I:  -Group instruction provided by verbal instruction and written materials to support subject.  Instructor reviews cardiac drug classes: antiplatelets, anticoagulants, beta blockers, and statins.  Instructor discusses reasons, side effects, and lifestyle considerations for each drug class.   Cardiac Drugs II:  -Group instruction provided by verbal instruction and written materials to support subject.  Instructor reviews cardiac drug classes: angiotensin converting enzyme inhibitors (ACE-I), angiotensin II receptor blockers (ARBs), nitrates, and calcium channel blockers.  Instructor discusses reasons, side effects, and lifestyle considerations for each drug class.   Anatomy and Physiology of the Circulatory System:  Group verbal and written instruction and models provide basic cardiac anatomy and physiology, with the coronary electrical and arterial systems. Review of: AMI, Angina, Valve disease, Heart Failure, Peripheral Artery Disease, Cardiac Arrhythmia, Pacemakers, and the ICD.   Other Education:  -Group or individual verbal, written, or video instructions that support the educational goals of the cardiac rehab program.   Holiday Eating Survival Tips:  -Group instruction provided by PowerPoint slides, verbal discussion, and written materials to support subject matter. The instructor gives patients tips, tricks, and techniques to help them not only survive but enjoy the holidays despite the onslaught of food that accompanies the holidays.   Knowledge Questionnaire Score: Knowledge Questionnaire Score - 08/22/18 1424      Knowledge Questionnaire Score   Pre Score  27/28       Core Components/Risk Factors/Patient Goals at Admission: Personal Goals and Risk Factors at  Admission - 08/22/18 1437      Core Components/Risk Factors/Patient Goals on Admission    Weight Management  Obesity;Yes    Intervention  Weight Management: Develop a combined nutrition and exercise program designed to reach desired caloric intake, while maintaining appropriate intake of nutrient and fiber, sodium and fats, and appropriate energy expenditure required for the weight goal.;Weight Management: Provide education and appropriate resources to help participant work on and attain dietary goals.;Weight Management/Obesity: Establish reasonable short term and long term weight goals.;Obesity: Provide education and appropriate resources to help participant work on and attain dietary goals.    Admit Weight  203 lb 14.8 oz (92.5 kg)    Expected Outcomes  Long Term: Adherence to nutrition and physical activity/exercise program aimed toward attainment of established weight goal;Weight Loss: Understanding of general recommendations for a balanced deficit meal plan, which promotes 1-2 lb weight loss per week and includes a negative energy balance of [phone] kcal/d;Short Term: Continue to assess and modify interventions until short term weight is achieved;Understanding recommendations for meals to include 15-35% energy as protein, 25-35% energy from fat, 35-60% energy from carbohydrates, less than 200mg  of dietary cholesterol, 20-35 gm of total fiber daily;Understanding of distribution of calorie intake throughout  the day with the consumption of 4-5 meals/snacks    Hypertension  Yes    Intervention  Provide education on lifestyle modifcations including regular physical activity/exercise, weight management, moderate sodium restriction and increased consumption of fresh fruit, vegetables, and low fat dairy, alcohol moderation, and smoking cessation.;Monitor prescription use compliance.    Expected Outcomes  Short Term: Continued assessment and intervention until BP is < 140/2mm HG in hypertensive participants.  < 130/63mm HG in hypertensive participants with diabetes, heart failure or chronic kidney disease.;Long Term: Maintenance of blood pressure at goal levels.    Lipids  Yes    Intervention  Provide education and support for participant on nutrition & aerobic/resistive exercise along with prescribed medications to achieve LDL 70mg , HDL >40mg .    Expected Outcomes  Short Term: Participant states understanding of desired cholesterol values and is compliant with medications prescribed. Participant is following exercise prescription and nutrition guidelines.;Long Term: Cholesterol controlled with medications as prescribed, with individualized exercise RX and with personalized nutrition plan. Value goals: LDL < 70mg , HDL > 40 mg.       Core Components/Risk Factors/Patient Goals Review:  Goals and Risk Factor Review    Row Name 08/28/18 1426 09/21/18 0821 01/22/19 0919         Core Components/Risk Factors/Patient Goals Review   Personal Goals Review  Weight Management/Obesity;Lipids;Hypertension  Weight Management/Obesity;Lipids;Hypertension  Weight Management/Obesity;Lipids;Hypertension     Review  Pt with multiple CAD RFs willing to participate in CR exercise.  Furkan would like to be able to participate in daily activities.  Pt continues to tolerate exercise well.  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   Paul is tolerating resuming exercise. VSS.     Expected Outcomes  Pt will continue to participate in CR exercise, nutrition, and lifestyle modification opportunities.   Pt will continue to participate in CR exercise, nutrition, and lifestyle modification opportunities.   Pt will continue to participate in CR exercise, nutrition, and lifestyle modification opportunities.         Core Components/Risk Factors/Patient Goals at Discharge (Final Review):  Goals and Risk Factor Review - 01/22/19 0919      Core  Components/Risk Factors/Patient Goals Review   Personal Goals Review  Weight Management/Obesity;Lipids;Hypertension    Review  Nikalas is tolerating resuming exercise. VSS.    Expected Outcomes  Pt will continue to participate in CR exercise, nutrition, and lifestyle modification opportunities.        ITP Comments: ITP Comments    Row Name 08/22/18 1337 08/28/18 1215 09/21/18 0820 01/22/19 [id number]     ITP Comments  Medical Director- Dr. Fransico Him, MD  30 Day ITP Review. Pt started exercise today and tolerated it well.   30 Day ITP Review. Pt continues to tolerate exercise well.  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   30 Day ITP Review.  Andras is tolerating resuming exercise. VSS.       Comments: See ITP Comments

## 2019-01-26 ENCOUNTER — Encounter (HOSPITAL_COMMUNITY)
Admission: RE | Admit: 2019-01-26 | Discharge: 2019-01-26 | Disposition: A | Discharge status: Home / Self Care | Payer: Medicare Other | Source: Ambulatory Visit | Attending: Cardiology | Admitting: Cardiology

## 2019-01-26 ENCOUNTER — Other Ambulatory Visit: Payer: Self-pay

## 2019-01-26 DIAGNOSIS — Z951 Presence of aortocoronary bypass graft: Secondary | ICD-10-CM

## 2019-01-29 ENCOUNTER — Other Ambulatory Visit: Payer: Self-pay

## 2019-01-29 ENCOUNTER — Encounter (HOSPITAL_COMMUNITY)
Admission: RE | Admit: 2019-01-29 | Discharge: 2019-01-29 | Disposition: A | Discharge status: Home / Self Care | Payer: Medicare Other | Source: Ambulatory Visit | Attending: Cardiology | Admitting: Cardiology

## 2019-01-29 DIAGNOSIS — Z951 Presence of aortocoronary bypass graft: Secondary | ICD-10-CM | POA: Diagnosis not present

## 2019-01-31 ENCOUNTER — Encounter (HOSPITAL_COMMUNITY)
Admission: RE | Admit: 2019-01-31 | Discharge: 2019-01-31 | Disposition: A | Discharge status: Home / Self Care | Payer: Medicare Other | Source: Ambulatory Visit | Attending: Cardiology | Admitting: Cardiology

## 2019-01-31 ENCOUNTER — Other Ambulatory Visit: Payer: Self-pay

## 2019-01-31 DIAGNOSIS — Z951 Presence of aortocoronary bypass graft: Secondary | ICD-10-CM

## 2019-02-02 ENCOUNTER — Other Ambulatory Visit: Payer: Self-pay

## 2019-02-02 ENCOUNTER — Encounter (HOSPITAL_COMMUNITY)
Admission: RE | Admit: 2019-02-02 | Discharge: 2019-02-02 | Disposition: A | Discharge status: Home / Self Care | Payer: Medicare Other | Source: Ambulatory Visit | Attending: Cardiology | Admitting: Cardiology

## 2019-02-02 DIAGNOSIS — Z951 Presence of aortocoronary bypass graft: Secondary | ICD-10-CM

## 2019-02-05 ENCOUNTER — Encounter (HOSPITAL_COMMUNITY)
Admission: RE | Admit: 2019-02-05 | Discharge: 2019-02-05 | Disposition: A | Discharge status: Home / Self Care | Payer: Medicare Other | Source: Ambulatory Visit | Attending: Cardiology | Admitting: Cardiology

## 2019-02-05 ENCOUNTER — Other Ambulatory Visit: Payer: Self-pay

## 2019-02-05 DIAGNOSIS — Z951 Presence of aortocoronary bypass graft: Secondary | ICD-10-CM | POA: Diagnosis not present

## 2019-02-06 ENCOUNTER — Encounter (HOSPITAL_COMMUNITY): Payer: Self-pay | Admitting: Cardiology

## 2019-02-06 ENCOUNTER — Telehealth (HOSPITAL_COMMUNITY): Payer: Self-pay | Admitting: *Deleted

## 2019-02-06 ENCOUNTER — Other Ambulatory Visit: Payer: Self-pay

## 2019-02-06 ENCOUNTER — Ambulatory Visit (HOSPITAL_COMMUNITY)
Admission: RE | Admit: 2019-02-06 | Discharge: 2019-02-06 | Disposition: A | Discharge status: Home / Self Care | Payer: Medicare Other | Source: Ambulatory Visit | Attending: Cardiology | Admitting: Cardiology

## 2019-02-06 VITALS — BP 146/67 | HR 70 | Wt 217.0 lb

## 2019-02-06 DIAGNOSIS — I5022 Chronic systolic (congestive) heart failure: Secondary | ICD-10-CM | POA: Insufficient documentation

## 2019-02-06 DIAGNOSIS — I2510 Atherosclerotic heart disease of native coronary artery without angina pectoris: Secondary | ICD-10-CM | POA: Diagnosis not present

## 2019-02-06 DIAGNOSIS — I255 Ischemic cardiomyopathy: Secondary | ICD-10-CM | POA: Diagnosis not present

## 2019-02-06 DIAGNOSIS — E785 Hyperlipidemia, unspecified: Secondary | ICD-10-CM | POA: Diagnosis not present

## 2019-02-06 DIAGNOSIS — Z951 Presence of aortocoronary bypass graft: Secondary | ICD-10-CM | POA: Diagnosis not present

## 2019-02-06 DIAGNOSIS — Z79899 Other long term (current) drug therapy: Secondary | ICD-10-CM | POA: Diagnosis not present

## 2019-02-06 DIAGNOSIS — Z7982 Long term (current) use of aspirin: Secondary | ICD-10-CM | POA: Diagnosis not present

## 2019-02-06 DIAGNOSIS — Z87891 Personal history of nicotine dependence: Secondary | ICD-10-CM | POA: Diagnosis not present

## 2019-02-06 DIAGNOSIS — J449 Chronic obstructive pulmonary disease, unspecified: Secondary | ICD-10-CM | POA: Diagnosis not present

## 2019-02-06 DIAGNOSIS — Z8249 Family history of ischemic heart disease and other diseases of the circulatory system: Secondary | ICD-10-CM | POA: Diagnosis not present

## 2019-02-06 DIAGNOSIS — I252 Old myocardial infarction: Secondary | ICD-10-CM | POA: Insufficient documentation

## 2019-02-06 LAB — BASIC METABOLIC PANEL
Anion gap: 10 (ref 5–15)
BUN: 13 mg/dL (ref 8–23)
CO2: 24 mmol/L (ref 22–32)
Calcium: 8.7 mg/dL — ABNORMAL LOW (ref 8.9–10.3)
Chloride: 105 mmol/L (ref 98–111)
Creatinine, Ser: 1.17 mg/dL (ref 0.61–1.24)
GFR calc Af Amer: >60 mL/min (ref >60)
GFR calc non Af Amer: >60 mL/min (ref >60)
Glucose, Bld: 99 mg/dL (ref 70–99)
Potassium: 4.2 mmol/L (ref 3.5–5.1)
Sodium: 139 mmol/L (ref 135–145)

## 2019-02-06 MED ORDER — CARVEDILOL 25 MG PO TABS: 25.0000 mg | ORAL_TABLET | Freq: Two times a day (BID) | ORAL | 6 refills | Status: DC

## 2019-02-06 MED ORDER — EPLERENONE 25 MG PO TABS: 25.0000 mg | ORAL_TABLET | Freq: Every day | ORAL | 6 refills | Status: DC

## 2019-02-06 NOTE — Telephone Encounter (Signed)
Received note from pharmacy pt needs PA for Eplerenone.  Completed PA on CMM, will await response.

## 2019-02-06 NOTE — Patient Instructions (Signed)
Stop Spironolactone  Start Eplerenone 25 mg daily in 1 week (Tue 8/11), if you have any issues with this medication please let us know  Increase Carvedilol to 25 mg Twice daily   Labs done today  Labs in 3 weeks  Your physician recommends that you schedule a follow-up appointment in: 3 months  At the [facility], you and your health needs are our priority. As part of our continuing mission to provide you with exceptional heart care, we have created designated Provider Care Teams. These Care Teams include your primary Cardiologist (physician) and Advanced Practice Providers (APPs- Physician Assistants and Nurse Practitioners) who all work together to provide you with the care you need, when you need it.   You may see any of the following providers on your designated Care Team at your next follow up: Marland Kitchen Dr Glori Bickers . Dr [name] . Darrick Grinder, NP   Please be sure to bring in all your medications bottles to every appointment.

## 2019-02-07 ENCOUNTER — Other Ambulatory Visit: Payer: Self-pay

## 2019-02-07 ENCOUNTER — Encounter (HOSPITAL_COMMUNITY)
Admission: RE | Admit: 2019-02-07 | Discharge: 2019-02-07 | Disposition: A | Discharge status: Home / Self Care | Payer: Medicare Other | Source: Ambulatory Visit | Attending: Cardiology | Admitting: Cardiology

## 2019-02-07 DIAGNOSIS — Z951 Presence of aortocoronary bypass graft: Secondary | ICD-10-CM | POA: Diagnosis not present

## 2019-02-07 NOTE — Progress Notes (Signed)
[facility] Cardiology: Dr. Aundra Dubin PCP: Dr. Forde Dandy  78 y.o. with history of CAD and ischemic cardiomyopathy presents for followup of CHF. Patient had no cardiac history prior to 6/19. However, since around 12/18, he had noted exertional dyspnea.  In 6[place]19, [place]  He developed very severe dyspnea and went to the hospital in [place] where he was found to have NSTEMI.  LHC was done, showing chronic occlusions of the LAD and RCA with collaterals.  Echo showed EF 25-30%.  No intervention, he was eventually discharged to come home.  He has been seen by Dr. Prescott Gum for CABG evaluation.  Cardiac MRI in 6/19 showed EF 28% with substantial viability.  RHC was done in 7/19, showing mildly elevated filling pressures but low cardiac output, with CI 1.7.  After RHC, I increased his Lasix and added digoxin.    Of note, he has quit smoking since 6/19.   He was admitted for CABG in 9/19.  Volume overload post-op, treated with diuresis.    Echo in 12/19 showed EF 40%, mid anteroseptal and mid inferoseptal akinesis.   Weight is up today.  He is driving again for work. No significant exertional dyspnea or chest pain.  No orthopnea/PND.  No lightheadedness.  He will be finishing cardiac rehab soon. He has developed bilateral painful gynecomastia.   Labs (7/19): hgb 14.1, K 4.1, creatinine 1.26, LDL 28 Labs (9/19): K 3.7, creatinine 1.09, LFTs normal, hgb 12.6 Labs (11/19): K 4.4, creatinine 1.31 Labs (12/19): K 4.4, creatinine 1.19 Labs (3/20): K 4.5, creatinine 1.12 Labs (5/20): LDL 41, HDL 27, TGs 176  PMH: 1. H/o retinal detachment 2. H/o transverse myelitis (remote) 3. CAD: NSTEMI 6/19 in [place]. - LHC (6/19): Chronic total occlusion of LAD and chronic total occlusion of RCA with collaterals.  - CABG (9/19): LIMA-LAD, SVG-D, SVG-ramus, SVG-PDA.  4. Chronic systolic CHF: Ischemic cardiomyopathy.   - Echo (6/19): LV moderately dilated, EF 25-30%, mild MR.  - Cardiac MRI  (6/19): EF 28%, significant viability noted.  - RHC (7/19): mean RA 8, PA 40/16 mean 26, mean PCWP 23, CI 1.7, PVR 0.83 WU - Echo (12/19): EF 40%, mid inferoseptal and mid anteroseptal akinesis, inferior hypokinesis, normal RV size and systolic function.  - Painful gynecomastia with spironolactone.  5. COPD: PFTs (9/19) with moderate obstruction.   SH: Married, lives in [place].  He quit smoking in 6/19.    Family History  Problem Relation Age of Onset  . Heart disease Mother   . Cancer Mother   . Heart attack Father    ROS: All systems reviewed and negative except as per HPI.   Current Outpatient Medications  Medication Sig Dispense Refill  . acetaminophen (TYLENOL) 500 MG tablet Take 500 mg by mouth at bedtime.    Marland Kitchen aspirin EC 325 MG EC tablet Take 1 tablet (325 mg total) by mouth daily. 30 tablet 0  . atorvastatin (LIPITOR) 40 MG tablet Take 1 tablet (40 mg total) by mouth at bedtime. 90 tablet 3  . carvedilol (COREG) 25 MG tablet Take 1 tablet (25 mg total) by mouth 2 (two) times daily. 60 tablet 6  . Icosapent Ethyl 1 g CAPS Take 2 capsules (2 g total) by mouth 2 (two) times daily. 180 capsule 3  . Polyethyl Glycol-Propyl Glycol (SYSTANE) 0.4-0.3 % SOLN Place 1 drop into both eyes 2 (two) times daily.    . sacubitril-valsartan (ENTRESTO) 97-103 MG Take 1 tablet by mouth 2 (two) times  daily. 60 tablet 6  . eplerenone (INSPRA) 25 MG tablet Take 1 tablet (25 mg total) by mouth daily. 30 tablet 6   No current facility-administered medications for this encounter.    BP (!) 146/67   Pulse 70   Wt 98.4 kg (217 lb)   SpO2 97%   BMI 32.05 kg/m  General: NAD Neck: No JVD, no thyromegaly or thyroid nodule.  Lungs: Clear to auscultation bilaterally with normal respiratory effort. CV: Nondisplaced PMI.  Heart regular S1/S2, no S3/S4, no murmur.  No peripheral edema.  No carotid bruit.  Normal pedal pulses.  Abdomen: Soft, nontender, no hepatosplenomegaly, no distention.  Skin: Intact  without lesions or rashes.  Neurologic: Alert and oriented x 3.  Psych: Normal affect. Extremities: No clubbing or cyanosis.  HEENT: Normal.   Assessment/Plan: 1. CAD: LHC in 6/19 with CTOs of LAD and RCA.  Cardiac MRI showed significant myocardial viability. He is s/p CABG x 4 in 9/19.   - Continue ASA 81 and statin.  - Continue regular exercise even after cardiac rehab is finished.  2. Chronic systolic CHF: Ischemic cardiomyopathy.  Echo in 6/19 with EF 25-20%.  Cardiac MRI in 7/19 with EF 28%, significant viability noted. RHC in 7/19 showed mildly elevated filling pressure and low cardiac output. Now s/p CABG in 9/19.  Echo post-op in 12/19 showed EF up to 40%.  Doing well today, NYHA class I-II symptoms and not volume overloaded on exam.  Painful gynecomastia with spironolactone.  - Continue Entresto to 97/103 bid.    - BP elevated, increase Coreg to 25 mg bid.  - Stop spironolactone due to bilateral painful gynecomastia, after 1 week, I will have him start eplerenone 25 mg daily.  BMET today and in 3 wks.  - EF is out of range of ICD.  3. COPD: He has quit smoking since 6/19.  4. Hyperlipidemia: Good lipids on atorvastatin and Vascepa.   Followup in 3 months.   [name] 02/07/2019

## 2019-02-07 NOTE — Telephone Encounter (Signed)
PA was denied, completed form for appeal and placed it in Dr Claris Gladden folder for signature

## 2019-02-09 ENCOUNTER — Other Ambulatory Visit: Payer: Self-pay

## 2019-02-09 ENCOUNTER — Encounter (HOSPITAL_COMMUNITY)
Admission: RE | Admit: 2019-02-09 | Discharge: 2019-02-09 | Disposition: A | Discharge status: Home / Self Care | Payer: Medicare Other | Source: Ambulatory Visit | Attending: Cardiology | Admitting: Cardiology

## 2019-02-09 VITALS — BP 118/58 | HR 76 | Temp 97.5°F | Ht 69.0 in | Wt 215.8 lb

## 2019-02-09 DIAGNOSIS — Z951 Presence of aortocoronary bypass graft: Secondary | ICD-10-CM

## 2019-02-12 ENCOUNTER — Encounter (HOSPITAL_COMMUNITY): Payer: Medicare Other

## 2019-02-12 NOTE — Progress Notes (Signed)
Discharge Progress Report  Patient Details  Name: Robert [name] MRN: [id number] Date of Birth: 1941/03/08 Referring Provider:     CARDIAC REHAB PHASE II ORIENTATION from 08/22/2018 in [place]  Referring Provider  [name], MD       Number of Visits: 25  Reason for Discharge:  Patient reached a stable level of exercise. Patient independent in their exercise. Patient has met program and personal goals.  Smoking History:  Social History   Tobacco Use  Smoking Status Former Smoker  . Packs/day: 2.00  . Years: 52.00  . Pack years: 104.00  . Types: Cigarettes  . Quit date: 12/18/2017  . Years since quitting: 1.1  Smokeless Tobacco Never Used    Diagnosis:  03/16/18 CABG x4  ADL UCSD:   Initial Exercise Prescription: Initial Exercise Prescription - 08/22/18 1500      Date of Initial Exercise RX and Referring Provider   Date  08/22/18    Referring Provider  [name], MD    Expected Discharge Date  11/24/18      Bike   Level  0.8    Minutes  10    METs  2.64      NuStep   Level  2    SPM  85    Minutes  10    METs  2.5      Track   Laps  9    Minutes  10    METs  2.57      Prescription Details   Frequency (times per week)  3    Duration  Progress to 30 minutes of continuous aerobic without signs/symptoms of physical distress      Intensity   THRR 40-80% of Max Heartrate  58-116    Ratings of Perceived Exertion  11-13    Perceived Dyspnea  0-4      Progression   Progression  Continue to progress workloads to maintain intensity without signs/symptoms of physical distress.      Resistance Training   Training Prescription  Yes    Weight  3lbs    Reps  10-15       Discharge Exercise Prescription (Final Exercise Prescription Changes): Exercise Prescription Changes - 02/09/19 1110      Response to Exercise   Blood Pressure (Admit)  118/58    Blood Pressure (Exercise)  140/66    Blood Pressure  (Exit)  118/70    Heart Rate (Admit)  76 bpm    Heart Rate (Exercise)  91 bpm    Heart Rate (Exit)  57 bpm    Rating of Perceived Exertion (Exercise)  11    Perceived Dyspnea (Exercise)  0    Symptoms  None    Duration  Progress to 30 minutes of  aerobic without signs/symptoms of physical distress    Intensity  THRR unchanged      Progression   Progression  Continue to progress workloads to maintain intensity without signs/symptoms of physical distress.    Average METs  2.4      Resistance Training   Training Prescription  Yes    Weight  4lbs    Reps  10-15    Time  10 Minutes      Interval Training   Interval Training  No      NuStep   Level  4    SPM  85    Minutes  15      Arm Ergometer  Level  3    Watts  25    Minutes  15    METs  2.41      Home Exercise Plan   Plans to continue exercise at  Home (comment)    Frequency  Add 2 additional days to program exercise sessions.    Initial Home Exercises Provided  01/24/19       Functional Capacity: 6 Minute Walk    Row Name 08/22/18 1409 02/07/19 1103       6 Minute Walk   Phase  Initial  Discharge    Distance  1340 feet  1010 feet    Distance % Change  -  24.63 %    Walk Time  6 minutes  6 minutes    # of Rest Breaks  0  2 Seated rest x2 for a total of 1 min 17 sec    MPH  2.54  1.91    METS  2.61  2.06    RPE  11  13    Perceived Dyspnea   0  0    VO2 Peak  9.13  7.22    Symptoms  No  Yes (comment)    Comments  -  Patient c/o of right calf pain, which he rated a "6/10" on the pain scale. Pt took 2 seated rest break because of calf pain.    Resting HR  67 bpm  79 bpm    Resting BP  138/80  116/50    Resting Oxygen Saturation   95 %  -    Exercise Oxygen Saturation  during 6 min walk  97 %  94 %    Max Ex. HR  92 bpm  96 bpm    Max Ex. BP  148/72  162/72    2 Minute Post BP  122/68  112/62       Psychological, QOL, Others - Outcomes: PHQ 2/9: Depression screen [facility] 2/9 02/09/2019 08/28/2018 05/24/2018   Decreased Interest 0 0 0  Down, Depressed, Hopeless 0 0 0  PHQ - 2 Score 0 0 0    Quality of Life: Quality of Life - 02/09/19 1132      Quality of Life   Select  Quality of Life      Quality of Life Scores   Health/Function Pre  24.1 %    Health/Function Post  22.97 %    Health/Function % Change  -4.69 %    Socioeconomic Pre  24.43 %    Socioeconomic Post  22 %    Socioeconomic % Change   -9.95 %    Psych/Spiritual Pre  26.79 %    Psych/Spiritual Post  22 %    Psych/Spiritual % Change  -17.88 %    Family Pre  27 %    Family Post  21 %    Family % Change  -22.22 %    GLOBAL Pre  25.09 %    GLOBAL Post  22.31 %    GLOBAL % Change  -11.08 %       Personal Goals: Goals established at orientation with interventions provided to work toward goal. Personal Goals and Risk Factors at Admission - 08/22/18 1437      Core Components/Risk Factors/Patient Goals on Admission    Weight Management  Obesity;Yes    Intervention  Weight Management: Develop a combined nutrition and exercise program designed to reach desired caloric intake, while maintaining appropriate intake of nutrient and fiber, sodium and fats, and appropriate  energy expenditure required for the weight goal.;Weight Management: Provide education and appropriate resources to help participant work on and attain dietary goals.;Weight Management/Obesity: Establish reasonable short term and long term weight goals.;Obesity: Provide education and appropriate resources to help participant work on and attain dietary goals.    Admit Weight  203 lb 14.8 oz (92.5 kg)    Expected Outcomes  Long Term: Adherence to nutrition and physical activity/exercise program aimed toward attainment of established weight goal;Weight Loss: Understanding of general recommendations for a balanced deficit meal plan, which promotes 1-2 lb weight loss per week and includes a negative energy balance of [phone] kcal/d;Short Term: Continue to assess and modify  interventions until short term weight is achieved;Understanding recommendations for meals to include 15-35% energy as protein, 25-35% energy from fat, 35-60% energy from carbohydrates, less than 256m of dietary cholesterol, 20-35 gm of total fiber daily;Understanding of distribution of calorie intake throughout the day with the consumption of 4-5 meals/snacks    Hypertension  Yes    Intervention  Provide education on lifestyle modifcations including regular physical activity/exercise, weight management, moderate sodium restriction and increased consumption of fresh fruit, vegetables, and low fat dairy, alcohol moderation, and smoking cessation.;Monitor prescription use compliance.    Expected Outcomes  Short Term: Continued assessment and intervention until BP is < 140/947mHG in hypertensive participants. < 130/8040mG in hypertensive participants with diabetes, heart failure or chronic kidney disease.;Long Term: Maintenance of blood pressure at goal levels.    Lipids  Yes    Intervention  Provide education and support for participant on nutrition & aerobic/resistive exercise along with prescribed medications to achieve LDL <62m51mDL >40mg35m Expected Outcomes  Short Term: Participant states understanding of desired cholesterol values and is compliant with medications prescribed. Participant is following exercise prescription and nutrition guidelines.;Long Term: Cholesterol controlled with medications as prescribed, with individualized exercise RX and with personalized nutrition plan. Value goals: LDL < 62mg,55m > 40 mg.        Personal Goals Discharge: Goals and Risk Factor Review    Row Name 08/28/18 1426 09/21/18 0821 08144/20 0919 02/09/19 1552       Core Components/Risk Factors/Patient Goals Review   Personal Goals Review  Weight Management/Obesity;Lipids;Hypertension  Weight Management/Obesity;Lipids;Hypertension  Weight Management/Obesity;Lipids;Hypertension  Weight  Management/Obesity;Lipids;Hypertension    Review  Pt with multiple CAD RFs willing to participate in CR exercise.  Everitt Axel like to be able to participate in daily activities.  Pt continues to tolerate exercise well.  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   Caylor Emileolerating resuming exercise. VSS.  Pt has graduated CR with 24 completed sessions.  Rudi enjoyed participating in CR and felt that he was able to increase his strength.    Expected Outcomes  Pt will continue to participate in CR exercise, nutrition, and lifestyle modification opportunities.   Pt will continue to participate in CR exercise, nutrition, and lifestyle modification opportunities.   Pt will continue to participate in CR exercise, nutrition, and lifestyle modification opportunities.   Pt will continue to participate in exercise, nutrition, and lifestyle modification opportunities.  Florencio does not currently exercise and was encouraged to walk 30 minutes three times a week.       Exercise Goals and Review: Exercise Goals    Row Name 08/22/18 1344             Exercise Goals   Increase Physical  Activity  Yes       Intervention  Provide advice, education, support and counseling about physical activity/exercise needs.;Develop an individualized exercise prescription for aerobic and resistive training based on initial evaluation findings, risk stratification, comorbidities and participant's personal goals.       Expected Outcomes  Short Term: Attend rehab on a regular basis to increase amount of physical activity.;Long Term: Add in home exercise to make exercise part of routine and to increase amount of physical activity.;Long Term: Exercising regularly at least 3-5 days a week.       Increase Strength and Stamina  Yes       Intervention  Provide advice, education, support and counseling about physical activity/exercise needs.;Develop  an individualized exercise prescription for aerobic and resistive training based on initial evaluation findings, risk stratification, comorbidities and participant's personal goals.       Expected Outcomes  Short Term: Increase workloads from initial exercise prescription for resistance, speed, and METs.;Short Term: Perform resistance training exercises routinely during rehab and add in resistance training at home;Long Term: Improve cardiorespiratory fitness, muscular endurance and strength as measured by increased METs and functional capacity (6MWT)       Able to understand and use rate of perceived exertion (RPE) scale  Yes       Intervention  Provide education and explanation on how to use RPE scale       Expected Outcomes  Short Term: Able to use RPE daily in rehab to express subjective intensity level;Long Term:  Able to use RPE to guide intensity level when exercising independently       Knowledge and understanding of Target Heart Rate Range (THRR)  Yes       Intervention  Provide education and explanation of THRR including how the numbers were predicted and where they are located for reference       Expected Outcomes  Short Term: Able to state/look up THRR;Long Term: Able to use THRR to govern intensity when exercising independently;Short Term: Able to use daily as guideline for intensity in rehab       Able to check pulse independently  Yes       Intervention  Provide education and demonstration on how to check pulse in carotid and radial arteries.;Review the importance of being able to check your own pulse for safety during independent exercise       Expected Outcomes  Short Term: Able to explain why pulse checking is important during independent exercise;Long Term: Able to check pulse independently and accurately       Understanding of Exercise Prescription  Yes       Intervention  Provide education, explanation, and written materials on patient's individual exercise prescription        Expected Outcomes  Short Term: Able to explain program exercise prescription;Long Term: Able to explain home exercise prescription to exercise independently          Exercise Goals Re-Evaluation: Exercise Goals Re-Evaluation    Row Name 01/08/19 1155 01/24/19 1335 02/09/19 1153         Exercise Goal Re-Evaluation   Exercise Goals Review  Increase Physical Activity;Understanding of Exercise Prescription;Increase Strength and Stamina;Knowledge and understanding of Target Heart Rate Range (THRR);Able to understand and use rate of perceived exertion (RPE) scale;Able to check pulse independently  Increase Physical Activity;Increase Strength and Stamina;Able to understand and use rate of perceived exertion (RPE) scale;Knowledge and understanding of Target Heart Rate Range (THRR);Able to check pulse independently;Understanding of Exercise Prescription  Increase  Physical Activity;Increase Strength and Stamina;Able to understand and use rate of perceived exertion (RPE) scale;Knowledge and understanding of Target Heart Rate Range (THRR);Able to check pulse independently;Understanding of Exercise Prescription     Comments  Pt first exercise session since department closure due to COVID-19. Pt tolerated exercise well.  Reviewed goals and home exercise with Pt. Pt is not currently exercising at home. Pt stated he is working his job 2 days per week in addition to CR. Pt was encouraged to walk 30 minutes two days a week in addition to CR. Pt stated understanding and willingness to try.  Patient completed the phase 2 cardiac rehab program and was able to tolerated low intensity exercise without c/o. Pt encouraged to walk 15 minutes BID at least 3 days/week to help improve cardiorespiratory fitness. Reviewed home exercise guidelines with patient including temperature precautions and stop signs for exercise.     Expected Outcomes  Will continue to monitor and progress Pt as tolerated.  Will continue to monitor and  progress Pt as tolerated.  Patient will walk at home to help improve cardiorespiratory fitness        Nutrition & Weight - Outcomes: Pre Biometrics - 08/22/18 1334      Pre Biometrics   Height  5' 9"  (1.753 m)    Weight  92.5 kg    Waist Circumference  43 inches    Hip Circumference  44.8 inches    Waist to Hip Ratio  0.96 %    BMI (Calculated)  30.1    Triceps Skinfold  15 mm    % Body Fat  30 %    Grip Strength  36 kg    Flexibility  0 in    Single Leg Stand  4 seconds      Post Biometrics - 02/09/19 1110       Post  Biometrics   Height  5' 9"  (1.753 m)    Weight  97.9 kg    Waist Circumference  43.5 inches    Hip Circumference  44.5 inches    Waist to Hip Ratio  0.98 %    BMI (Calculated)  31.86    Triceps Skinfold  16 mm    % Body Fat  31.1 %    Grip Strength  41 kg    Flexibility  0 in    Single Leg Stand  6.18 seconds       Nutrition: Nutrition Therapy & Goals - 08/23/18 1331      Nutrition Therapy   Diet  heart healthy, carb modified      Personal Nutrition Goals   Nutrition Goal  Pt to identify and limit food sources of saturated fat, trans fat, refined carbohydrates and sodium    Personal Goal #2  Pt able to name foods that affect blood glucose.    Personal Goal #3  Pt able to name foods that affect blood glucose.    Personal Goal #4  Pt to eat a variety of non-starchy vegetables      Intervention Plan   Intervention  Prescribe, educate and counsel regarding individualized specific dietary modifications aiming towards targeted core components such as weight, hypertension, lipid management, diabetes, heart failure and other comorbidities.    Expected Outcomes  Short Term Goal: Understand basic principles of dietary content, such as calories, fat, sodium, cholesterol and nutrients.;Long Term Goal: Adherence to prescribed nutrition plan.       Nutrition Discharge: Nutrition Assessments - 08/23/18 1331  MEDFICTS Scores   Pre Score  56        Education Questionnaire Score: Knowledge Questionnaire Score - 02/09/19 1133      Knowledge Questionnaire Score   Pre Score  27/28    Post Score  26/28       Goals reviewed with patient; copy given to patient.

## 2019-02-14 ENCOUNTER — Encounter (HOSPITAL_COMMUNITY): Payer: Medicare Other

## 2019-02-14 ENCOUNTER — Other Ambulatory Visit (HOSPITAL_COMMUNITY): Payer: Self-pay

## 2019-02-14 MED ORDER — EPLERENONE 25 MG PO TABS: 25.0000 mg | ORAL_TABLET | Freq: Every day | ORAL | 6 refills | Status: DC

## 2019-02-14 NOTE — Telephone Encounter (Signed)
pts wife called inquiring about med PA. I told her it was being appealed and we would let her know once it was approved.

## 2019-02-15 ENCOUNTER — Other Ambulatory Visit (HOSPITAL_COMMUNITY): Payer: Self-pay

## 2019-02-15 MED ORDER — EPLERENONE 25 MG PO TABS: 25.0000 mg | ORAL_TABLET | Freq: Every day | ORAL | 6 refills | Status: DC

## 2019-02-16 ENCOUNTER — Encounter (HOSPITAL_COMMUNITY): Payer: Medicare Other

## 2019-02-19 ENCOUNTER — Encounter (HOSPITAL_COMMUNITY): Payer: Medicare Other

## 2019-02-21 ENCOUNTER — Encounter (HOSPITAL_COMMUNITY): Payer: Medicare Other

## 2019-02-22 NOTE — Telephone Encounter (Signed)
eplerenone was approved for patient staring 02/20/2019.  Left VM for pt

## 2019-02-23 ENCOUNTER — Encounter (HOSPITAL_COMMUNITY): Payer: Medicare Other

## 2019-02-26 ENCOUNTER — Encounter (HOSPITAL_COMMUNITY): Payer: Medicare Other

## 2019-02-27 ENCOUNTER — Other Ambulatory Visit: Payer: Self-pay

## 2019-02-27 ENCOUNTER — Ambulatory Visit (HOSPITAL_COMMUNITY)
Admission: RE | Admit: 2019-02-27 | Discharge: 2019-02-27 | Disposition: A | Discharge status: Home / Self Care | Payer: Medicare Other | Source: Ambulatory Visit | Attending: Internal Medicine | Admitting: Internal Medicine

## 2019-02-27 DIAGNOSIS — I5022 Chronic systolic (congestive) heart failure: Secondary | ICD-10-CM | POA: Diagnosis not present

## 2019-02-27 LAB — BASIC METABOLIC PANEL
Anion gap: 9 (ref 5–15)
BUN: 16 mg/dL (ref 8–23)
CO2: 24 mmol/L (ref 22–32)
Calcium: 8.7 mg/dL — ABNORMAL LOW (ref 8.9–10.3)
Chloride: 104 mmol/L (ref 98–111)
Creatinine, Ser: 1.12 mg/dL (ref 0.61–1.24)
GFR calc Af Amer: >60 mL/min (ref >60)
GFR calc non Af Amer: >60 mL/min (ref >60)
Glucose, Bld: 149 mg/dL — ABNORMAL HIGH (ref 70–99)
Potassium: 4.9 mmol/L (ref 3.5–5.1)
Sodium: 137 mmol/L (ref 135–145)

## 2019-02-28 ENCOUNTER — Encounter (HOSPITAL_COMMUNITY): Payer: Medicare Other

## 2019-03-02 ENCOUNTER — Other Ambulatory Visit: Payer: Self-pay

## 2019-03-02 ENCOUNTER — Encounter (INDEPENDENT_AMBULATORY_CARE_PROVIDER_SITE_OTHER): Payer: Medicare Other | Admitting: Ophthalmology

## 2019-03-02 ENCOUNTER — Encounter (HOSPITAL_COMMUNITY): Payer: Medicare Other

## 2019-03-02 DIAGNOSIS — H338 Other retinal detachments: Secondary | ICD-10-CM

## 2019-03-02 DIAGNOSIS — H35343 Macular cyst, hole, or pseudohole, bilateral: Secondary | ICD-10-CM

## 2019-03-05 ENCOUNTER — Encounter (HOSPITAL_COMMUNITY): Payer: Medicare Other

## 2019-03-05 NOTE — Addendum Note (Signed)
Encounter addended by: Noel Christmas, RN on: 03/05/2019 4:43 PM  Actions taken: Episode resolved

## 2019-03-07 ENCOUNTER — Encounter (HOSPITAL_COMMUNITY): Payer: Medicare Other

## 2019-03-17 ENCOUNTER — Other Ambulatory Visit (HOSPITAL_COMMUNITY): Payer: Self-pay | Admitting: Cardiology

## 2019-03-20 ENCOUNTER — Other Ambulatory Visit (HOSPITAL_COMMUNITY): Payer: Self-pay | Admitting: Cardiology

## 2019-03-28 DIAGNOSIS — I5022 Chronic systolic (congestive) heart failure: Secondary | ICD-10-CM | POA: Diagnosis not present

## 2019-03-28 DIAGNOSIS — I255 Ischemic cardiomyopathy: Secondary | ICD-10-CM | POA: Diagnosis not present

## 2019-03-28 DIAGNOSIS — E785 Hyperlipidemia, unspecified: Secondary | ICD-10-CM | POA: Diagnosis not present

## 2019-03-28 DIAGNOSIS — E669 Obesity, unspecified: Secondary | ICD-10-CM | POA: Diagnosis not present

## 2019-03-28 DIAGNOSIS — N183 Chronic kidney disease, stage 3 (moderate): Secondary | ICD-10-CM | POA: Diagnosis not present

## 2019-03-28 DIAGNOSIS — I25810 Atherosclerosis of coronary artery bypass graft(s) without angina pectoris: Secondary | ICD-10-CM | POA: Diagnosis not present

## 2019-03-28 DIAGNOSIS — J449 Chronic obstructive pulmonary disease, unspecified: Secondary | ICD-10-CM | POA: Diagnosis not present

## 2019-03-28 DIAGNOSIS — I5189 Other ill-defined heart diseases: Secondary | ICD-10-CM | POA: Diagnosis not present

## 2019-03-28 DIAGNOSIS — I739 Peripheral vascular disease, unspecified: Secondary | ICD-10-CM | POA: Diagnosis not present

## 2019-03-28 DIAGNOSIS — I779 Disorder of arteries and arterioles, unspecified: Secondary | ICD-10-CM | POA: Diagnosis not present

## 2019-03-30 ENCOUNTER — Other Ambulatory Visit (HOSPITAL_COMMUNITY): Payer: Self-pay

## 2019-04-26 ENCOUNTER — Other Ambulatory Visit (HOSPITAL_COMMUNITY): Payer: Self-pay | Admitting: Cardiology

## 2019-05-09 ENCOUNTER — Other Ambulatory Visit: Payer: Self-pay

## 2019-05-09 ENCOUNTER — Encounter (HOSPITAL_COMMUNITY): Payer: Self-pay | Admitting: Cardiology

## 2019-05-09 ENCOUNTER — Ambulatory Visit (HOSPITAL_COMMUNITY)
Admission: RE | Admit: 2019-05-09 | Discharge: 2019-05-09 | Disposition: A | Discharge status: Home / Self Care | Payer: Medicare Other | Source: Ambulatory Visit | Attending: Cardiology | Admitting: Cardiology

## 2019-05-09 VITALS — BP 122/70 | HR 65 | Wt 216.6 lb

## 2019-05-09 DIAGNOSIS — Z79899 Other long term (current) drug therapy: Secondary | ICD-10-CM | POA: Diagnosis not present

## 2019-05-09 DIAGNOSIS — N62 Hypertrophy of breast: Secondary | ICD-10-CM | POA: Insufficient documentation

## 2019-05-09 DIAGNOSIS — Z8249 Family history of ischemic heart disease and other diseases of the circulatory system: Secondary | ICD-10-CM | POA: Diagnosis not present

## 2019-05-09 DIAGNOSIS — I252 Old myocardial infarction: Secondary | ICD-10-CM | POA: Insufficient documentation

## 2019-05-09 DIAGNOSIS — Z7901 Long term (current) use of anticoagulants: Secondary | ICD-10-CM | POA: Insufficient documentation

## 2019-05-09 DIAGNOSIS — Z87891 Personal history of nicotine dependence: Secondary | ICD-10-CM | POA: Insufficient documentation

## 2019-05-09 DIAGNOSIS — I2510 Atherosclerotic heart disease of native coronary artery without angina pectoris: Secondary | ICD-10-CM | POA: Insufficient documentation

## 2019-05-09 DIAGNOSIS — Z7982 Long term (current) use of aspirin: Secondary | ICD-10-CM | POA: Insufficient documentation

## 2019-05-09 DIAGNOSIS — I5022 Chronic systolic (congestive) heart failure: Secondary | ICD-10-CM | POA: Insufficient documentation

## 2019-05-09 DIAGNOSIS — I255 Ischemic cardiomyopathy: Secondary | ICD-10-CM | POA: Insufficient documentation

## 2019-05-09 DIAGNOSIS — Z951 Presence of aortocoronary bypass graft: Secondary | ICD-10-CM | POA: Insufficient documentation

## 2019-05-09 DIAGNOSIS — R05 Cough: Secondary | ICD-10-CM | POA: Diagnosis not present

## 2019-05-09 DIAGNOSIS — J449 Chronic obstructive pulmonary disease, unspecified: Secondary | ICD-10-CM | POA: Insufficient documentation

## 2019-05-09 DIAGNOSIS — E785 Hyperlipidemia, unspecified: Secondary | ICD-10-CM | POA: Diagnosis not present

## 2019-05-09 LAB — BASIC METABOLIC PANEL
Anion gap: 11 (ref 5–15)
BUN: 15 mg/dL (ref 8–23)
CO2: 26 mmol/L (ref 22–32)
Calcium: 8.3 mg/dL — ABNORMAL LOW (ref 8.9–10.3)
Chloride: 102 mmol/L (ref 98–111)
Creatinine, Ser: 1.09 mg/dL (ref 0.61–1.24)
GFR calc Af Amer: >60 mL/min (ref >60)
GFR calc non Af Amer: >60 mL/min (ref >60)
Glucose, Bld: 124 mg/dL — ABNORMAL HIGH (ref 70–99)
Potassium: 4.1 mmol/L (ref 3.5–5.1)
Sodium: 139 mmol/L (ref 135–145)

## 2019-05-09 MED ORDER — EPLERENONE 50 MG PO TABS: 50.0000 mg | ORAL_TABLET | Freq: Every day | ORAL | 5 refills | Status: DC

## 2019-05-09 NOTE — Patient Instructions (Signed)
INCREASE Eplerenone to 50mg  (1 tab) daily  Labs today and repeat in 10 days We will only contact you if something comes back abnormal or we need to make some changes. Otherwise no news is good news!  Your physician recommends that you schedule a follow-up appointment in: 4 months with Dr Aundra Dubin   At the [facility], you and your health needs are our priority. As part of our continuing mission to provide you with exceptional heart care, we have created designated Provider Care Teams. These Care Teams include your primary Cardiologist (physician) and Advanced Practice Providers (APPs- Physician Assistants and Nurse Practitioners) who all work together to provide you with the care you need, when you need it.   You may see any of the following providers on your designated Care Team at your next follow up: Marland Kitchen Dr Glori Bickers . Dr [name] . Darrick Grinder, NP . Lyda Jester, PA   Please be sure to bring in all your medications bottles to every appointment.

## 2019-05-10 NOTE — Progress Notes (Signed)
[facility] Cardiology: Dr. Aundra Dubin PCP: Dr. Forde Dandy  78 y.o. with history of CAD and ischemic cardiomyopathy presents for followup of CHF. Patient had no cardiac history prior to 6/19. However, since around 12/18, he had noted exertional dyspnea.  In 6[place]19, [place]  He developed very severe dyspnea and went to the hospital in [place] where he was found to have NSTEMI.  LHC was done, showing chronic occlusions of the LAD and RCA with collaterals.  Echo showed EF 25-30%.  No intervention, he was eventually discharged to come home.  He has been seen by Dr. Prescott Gum for CABG evaluation.  Cardiac MRI in 6/19 showed EF 28% with substantial viability.  RHC was done in 7/19, showing mildly elevated filling pressures but low cardiac output, with CI 1.7.  After RHC, I increased his Lasix and added digoxin.    Of note, he has quit smoking since 6/19.   He was admitted for CABG in 9/19.  Volume overload post-op, treated with diuresis.    Echo in 12/19 showed EF 40%, mid anteroseptal and mid inferoseptal akinesis.   He had painful gynecomastia with spironolactone and switched to eplerenone with resolution.   He is doing well today.  Still has chronic cough, neither PPI trial nor inhalers helped it.  Doing well overall, dyspnea only with heavy exertion.  No chest pain.  No lightheadedness.  No orthopnea/PND. Weight down 1 lb.   Labs (7/19): hgb 14.1, K 4.1, creatinine 1.26, LDL 28 Labs (9/19): K 3.7, creatinine 1.09, LFTs normal, hgb 12.6 Labs (11/19): K 4.4, creatinine 1.31 Labs (12/19): K 4.4, creatinine 1.19 Labs (3/20): K 4.5, creatinine 1.12 Labs (5/20): LDL 41, HDL 27, TGs 176  Labs (8/20): K 4.9, creatinine 1.12  PMH: 1. H/o retinal detachment 2. H/o transverse myelitis (remote) 3. CAD: NSTEMI 6/19 in [place]. - LHC (6/19): Chronic total occlusion of LAD and chronic total occlusion of RCA with collaterals.  - CABG (9/19): LIMA-LAD, SVG-D, SVG-ramus, SVG-PDA.  4.  Chronic systolic CHF: Ischemic cardiomyopathy.   - Echo (6/19): LV moderately dilated, EF 25-30%, mild MR.  - Cardiac MRI (6/19): EF 28%, significant viability noted.  - RHC (7/19): mean RA 8, PA 40/16 mean 26, mean PCWP 23, CI 1.7, PVR 0.83 WU - Echo (12/19): EF 40%, mid inferoseptal and mid anteroseptal akinesis, inferior hypokinesis, normal RV size and systolic function.  - Painful gynecomastia with spironolactone.  5. COPD: PFTs (9/19) with moderate obstruction.   SH: Married, lives in [place].  He quit smoking in 6/19.    Family History  Problem Relation Age of Onset  . Heart disease Mother   . Cancer Mother   . Heart attack Father    ROS: All systems reviewed and negative except as per HPI.   Current Outpatient Medications  Medication Sig Dispense Refill  . aspirin EC 325 MG EC tablet Take 1 tablet (325 mg total) by mouth daily. 30 tablet 0  . atorvastatin (LIPITOR) 40 MG tablet Take 1 tablet (40 mg total) by mouth at bedtime. 90 tablet 3  . carvedilol (COREG) 25 MG tablet Take 1 tablet (25 mg total) by mouth 2 (two) times daily. 60 tablet 6  . eplerenone (INSPRA) 50 MG tablet Take 1 tablet (50 mg total) by mouth daily. 30 tablet 5  . Polyethyl Glycol-Propyl Glycol (SYSTANE) 0.4-0.3 % SOLN Place 1 drop into both eyes 2 (two) times daily.    . sacubitril-valsartan (ENTRESTO) 97-103 MG Take 1 tablet by  mouth 2 (two) times daily. 60 tablet 6  . VASCEPA 1 g CAPS TAKE 2 CAPSULES(2 GRAMS) BY MOUTH TWICE DAILY 180 capsule 3   No current facility-administered medications for this encounter.    BP 122/70   Pulse 65   Wt 98.2 kg (216 lb 9.6 oz)   SpO2 96%   BMI 31.99 kg/m  General: NAD Neck: No JVD, no thyromegaly or thyroid nodule.  Lungs: Clear to auscultation bilaterally with normal respiratory effort. CV: Nondisplaced PMI.  Heart regular S1/S2, no S3/S4, no murmur.  No peripheral edema.  No carotid bruit.  Normal pedal pulses.  Abdomen: Soft, nontender, no hepatosplenomegaly,  no distention.  Skin: Intact without lesions or rashes.  Neurologic: Alert and oriented x 3.  Psych: Normal affect. Extremities: No clubbing or cyanosis.  HEENT: Normal.   Assessment/Plan: 1. CAD: LHC in 6/19 with CTOs of LAD and RCA.  Cardiac MRI showed significant myocardial viability. He is s/p CABG x 4 in 9/19.   - Continue ASA 81 and statin.  2. Chronic systolic CHF: Ischemic cardiomyopathy.  Echo in 6/19 with EF 25-20%.  Cardiac MRI in 7/19 with EF 28%, significant viability noted. RHC in 7/19 showed mildly elevated filling pressure and low cardiac output. Now s/p CABG in 9/19.  Echo post-op in 12/19 showed EF up to 40%.  Doing well today, NYHA class II symptoms and not volume overloaded on exam.  Painful gynecomastia with spironolactone.  - Continue Entresto to 97/103 bid.    - Continue Coreg 25 mg bid.  - Increase eplerenone to 50 mg daily.  BMET today and again 10 days.   - EF is out of range of ICD.  3. COPD: He has quit smoking since 6/19.  4. Hyperlipidemia: Good lipids on atorvastatin and Vascepa.  5. Chronic cough: No improvement with PPI or inhalers.  Possibly due to COPD.  Not bothersome enough to him for pulmonary referral.   Followup in 4 months.   [name] 05/10/2019

## 2019-05-21 ENCOUNTER — Ambulatory Visit (HOSPITAL_COMMUNITY)
Admission: RE | Admit: 2019-05-21 | Discharge: 2019-05-21 | Disposition: A | Discharge status: Home / Self Care | Payer: Medicare Other | Source: Ambulatory Visit | Attending: Internal Medicine | Admitting: Internal Medicine

## 2019-05-21 ENCOUNTER — Other Ambulatory Visit: Payer: Self-pay

## 2019-05-21 DIAGNOSIS — I5022 Chronic systolic (congestive) heart failure: Secondary | ICD-10-CM | POA: Insufficient documentation

## 2019-05-21 LAB — BASIC METABOLIC PANEL
Anion gap: 12 (ref 5–15)
BUN: 18 mg/dL (ref 8–23)
CO2: 23 mmol/L (ref 22–32)
Calcium: 8.7 mg/dL — ABNORMAL LOW (ref 8.9–10.3)
Chloride: 103 mmol/L (ref 98–111)
Creatinine, Ser: 1.11 mg/dL (ref 0.61–1.24)
GFR calc Af Amer: >60 mL/min (ref >60)
GFR calc non Af Amer: >60 mL/min (ref >60)
Glucose, Bld: 212 mg/dL — ABNORMAL HIGH (ref 70–99)
Potassium: 4.3 mmol/L (ref 3.5–5.1)
Sodium: 138 mmol/L (ref 135–145)

## 2019-07-24 ENCOUNTER — Other Ambulatory Visit (HOSPITAL_COMMUNITY): Payer: Self-pay

## 2019-07-24 MED ORDER — ENTRESTO 97-103 MG PO TABS: 1.0000 | ORAL_TABLET | Freq: Two times a day (BID) | ORAL | 6 refills | Status: DC

## 2019-08-03 ENCOUNTER — Other Ambulatory Visit (HOSPITAL_COMMUNITY): Payer: Self-pay

## 2019-08-03 MED ORDER — EPLERENONE 50 MG PO TABS: 50.0000 mg | ORAL_TABLET | Freq: Every day | ORAL | 5 refills | Status: DC

## 2019-08-09 ENCOUNTER — Telehealth (HOSPITAL_COMMUNITY): Payer: Self-pay | Admitting: Pharmacy Technician

## 2019-08-09 NOTE — Telephone Encounter (Signed)
Patient Advocate Encounter   Received notification from [facility] Part D that prior authorization for Eplerenone is required.   PA submitted on CoverMyMeds Key [id number] Status is pending   Will continue to follow.  Charlann Boxer, CPhT

## 2019-08-10 MED ORDER — EPLERENONE 50 MG PO TABS: 50.0000 mg | ORAL_TABLET | Freq: Every day | ORAL | 5 refills | Status: DC

## 2019-08-10 NOTE — Telephone Encounter (Signed)
Patients wife called stating that the patient only had 1 pill left and was concerned that he wasn't going to have any to take over the weekend. After reviewing the patients case in CoverMyMeds the PA was approved for this patient. Patients wife was made aware and she stated that the pharmacy called and told them it was approved and should be ready for pick up soon

## 2019-09-03 ENCOUNTER — Telehealth (HOSPITAL_COMMUNITY): Payer: Self-pay | Admitting: Pharmacist

## 2019-09-03 MED ORDER — ICOSAPENT ETHYL 1 G PO CAPS: 2.0000 g | ORAL_CAPSULE | Freq: Two times a day (BID) | ORAL | 3 refills | Status: DC

## 2019-09-03 NOTE — Telephone Encounter (Signed)
Sent in [place] prescription refill to [place] per patient request.  Audry Riles, PharmD, BCPS, BCCP, CPP [facility] Pharmacist [phone]

## 2019-09-10 ENCOUNTER — Encounter (HOSPITAL_COMMUNITY): Payer: Self-pay | Admitting: Cardiology

## 2019-09-10 ENCOUNTER — Ambulatory Visit (HOSPITAL_COMMUNITY)
Admission: RE | Admit: 2019-09-10 | Discharge: 2019-09-10 | Disposition: A | Discharge status: Home / Self Care | Payer: Medicare Other | Source: Ambulatory Visit | Attending: Cardiology | Admitting: Cardiology

## 2019-09-10 ENCOUNTER — Other Ambulatory Visit: Payer: Self-pay

## 2019-09-10 VITALS — BP 144/82 | HR 66 | Wt 215.8 lb

## 2019-09-10 DIAGNOSIS — I5022 Chronic systolic (congestive) heart failure: Secondary | ICD-10-CM | POA: Diagnosis not present

## 2019-09-10 DIAGNOSIS — I255 Ischemic cardiomyopathy: Secondary | ICD-10-CM | POA: Insufficient documentation

## 2019-09-10 DIAGNOSIS — Z79899 Other long term (current) drug therapy: Secondary | ICD-10-CM | POA: Insufficient documentation

## 2019-09-10 DIAGNOSIS — E785 Hyperlipidemia, unspecified: Secondary | ICD-10-CM | POA: Insufficient documentation

## 2019-09-10 DIAGNOSIS — Z951 Presence of aortocoronary bypass graft: Secondary | ICD-10-CM | POA: Diagnosis not present

## 2019-09-10 DIAGNOSIS — Z8249 Family history of ischemic heart disease and other diseases of the circulatory system: Secondary | ICD-10-CM | POA: Diagnosis not present

## 2019-09-10 DIAGNOSIS — I739 Peripheral vascular disease, unspecified: Secondary | ICD-10-CM | POA: Diagnosis not present

## 2019-09-10 DIAGNOSIS — I2510 Atherosclerotic heart disease of native coronary artery without angina pectoris: Secondary | ICD-10-CM | POA: Diagnosis not present

## 2019-09-10 DIAGNOSIS — J449 Chronic obstructive pulmonary disease, unspecified: Secondary | ICD-10-CM | POA: Insufficient documentation

## 2019-09-10 DIAGNOSIS — M79604 Pain in right leg: Secondary | ICD-10-CM | POA: Insufficient documentation

## 2019-09-10 DIAGNOSIS — Z7982 Long term (current) use of aspirin: Secondary | ICD-10-CM | POA: Diagnosis not present

## 2019-09-10 DIAGNOSIS — Z87891 Personal history of nicotine dependence: Secondary | ICD-10-CM | POA: Insufficient documentation

## 2019-09-10 DIAGNOSIS — I252 Old myocardial infarction: Secondary | ICD-10-CM | POA: Diagnosis not present

## 2019-09-10 LAB — BASIC METABOLIC PANEL
Anion gap: 8 (ref 5–15)
BUN: 13 mg/dL (ref 8–23)
CO2: 25 mmol/L (ref 22–32)
Calcium: 8.7 mg/dL — ABNORMAL LOW (ref 8.9–10.3)
Chloride: 105 mmol/L (ref 98–111)
Creatinine, Ser: 1.14 mg/dL (ref 0.61–1.24)
GFR calc Af Amer: >60 mL/min (ref >60)
GFR calc non Af Amer: >60 mL/min (ref >60)
Glucose, Bld: 98 mg/dL (ref 70–99)
Potassium: 4.5 mmol/L (ref 3.5–5.1)
Sodium: 138 mmol/L (ref 135–145)

## 2019-09-10 MED ORDER — DAPAGLIFLOZIN PROPANEDIOL 10 MG PO TABS: 10.0000 mg | ORAL_TABLET | Freq: Every day | ORAL | 5 refills | Status: DC

## 2019-09-10 NOTE — Patient Instructions (Addendum)
START Farxiga 10mg  (1 tab) daily.  You were provided a 30-day free card. Please give it to your pharmacist.   Labs today We will only contact you if something comes back abnormal or we need to make some changes. Otherwise no news is good news!   Your doctor ordered for you to have an ultrasound of your legs.  You will get a call to schedule this appointment.    Your physician has requested that you have an echocardiogram. Echocardiography is a painless test that uses sound waves to create images of your heart. It provides your doctor with information about the size and shape of your heart and how well your heart's chambers and valves are working. This procedure takes approximately one hour. There are no restrictions for this procedure.   Your physician recommends that you schedule a follow-up appointment in: 3 months with Dr Aundra Dubin.    Please call office at [phone] option 2 if you have any questions or concerns.    At the [facility], you and your health needs are our priority. As part of our continuing mission to provide you with exceptional heart care, we have created designated Provider Care Teams. These Care Teams include your primary Cardiologist (physician) and Advanced Practice Providers (APPs- Physician Assistants and Nurse Practitioners) who all work together to provide you with the care you need, when you need it.   You may see any of the following providers on your designated Care Team at your next follow up: Marland Kitchen Dr Glori Bickers . Dr [name] . Darrick Grinder, NP . Lyda Jester, PA . Audry Riles, PharmD   Please be sure to bring in all your medications bottles to every appointment.

## 2019-09-10 NOTE — Progress Notes (Signed)
HF Cardiology: Dr. Aundra Dubin PCP: Dr. Forde Dandy  79 y.o. with history of CAD and ischemic cardiomyopathy presents for followup of CHF. Patient had no cardiac history prior to 6/19. However, since around 12/18, he had noted exertional dyspnea.  In 6/19, [place] was [place] the mountains.  He developed very severe dyspnea and went to the hospital in [place] where he was found to have NSTEMI.  LHC was done, showing chronic occlusions of the LAD and RCA with collaterals.  Echo showed EF 25-30%.  No intervention, he was eventually discharged to come home.  He has been seen by Dr. Prescott Gum for CABG evaluation.  Cardiac MRI in 6/19 showed EF 28% with substantial viability.  RHC was done in 7/19, showing mildly elevated filling pressures but low cardiac output, with CI 1.7.  After RHC, I increased his Lasix and added digoxin.    Of note, he has quit smoking since 6/19.   He was admitted for CABG in 9/19.  Volume overload post-op, treated with diuresis.    Echo in 12/19 showed EF 40%, mid anteroseptal and mid inferoseptal akinesis.   He had painful gynecomastia with spironolactone and switched to eplerenone with resolution.   Today he returns for HF follow up. Overall feeling fine. Complaining of ongoing cough with white sputum. Resolves with fluid intake.  Remains SOB with exertion.  Denies PND/Orthopnea. Limited by pain in his feet. Says he has pain in his right calf when walking that feels like a cramp. Appetite ok. No fever or chills.  He does not weigh at home. Taking all medications.   Labs (7/19): hgb 14.1, K 4.1, creatinine 1.26, LDL 28 Labs (9/19): K 3.7, creatinine 1.09, LFTs normal, hgb 12.6 Labs (11/19): K 4.4, creatinine 1.31 Labs (12/19): K 4.4, creatinine 1.19 Labs (3/20): K 4.5, creatinine 1.12 Labs (5/20): LDL 41, HDL 27, TGs 176  Labs (8/20): K 4.9, creatinine 1.12  PMH: 1. H/o retinal detachment 2. H/o transverse myelitis (remote) 3. CAD: NSTEMI 6/19 in [place]. - LHC  (6/19): Chronic total occlusion of LAD and chronic total occlusion of RCA with collaterals.  - CABG (9/19): LIMA-LAD, SVG-D, SVG-ramus, SVG-PDA.  4. Chronic systolic CHF: Ischemic cardiomyopathy.   - Echo (6/19): LV moderately dilated, EF 25-30%, mild MR.  - Cardiac MRI (6/19): EF 28%, significant viability noted.  - RHC (7/19): mean RA 8, PA 40/16 mean 26, mean PCWP 23, CI 1.7, PVR 0.83 WU - Echo (12/19): EF 40%, mid inferoseptal and mid anteroseptal akinesis, inferior hypokinesis, normal RV size and systolic function.  - Painful gynecomastia with spironolactone.  5. COPD: PFTs (9/19) with moderate obstruction.   SH: Married, lives in [place].  He quit smoking in 6/19.    Family History  Problem Relation Age of Onset  . Heart disease Mother   . Cancer Mother   . Heart attack Father    ROS: All systems reviewed and negative except as per HPI.   Current Outpatient Medications  Medication Sig Dispense Refill  . aspirin EC 325 MG EC tablet Take 1 tablet (325 mg total) by mouth daily. 30 tablet 0  . atorvastatin (LIPITOR) 40 MG tablet Take 1 tablet (40 mg total) by mouth at bedtime. 90 tablet 3  . carvedilol (COREG) 25 MG tablet Take 1 tablet (25 mg total) by mouth 2 (two) times daily. 60 tablet 6  . eplerenone (INSPRA) 50 MG tablet Take 1 tablet (50 mg total) by mouth daily. 30 tablet 5  . icosapent Ethyl (VASCEPA)  1 g capsule Take 2 capsules (2 g total) by mouth 2 (two) times daily. 360 capsule 3  . Polyethyl Glycol-Propyl Glycol (SYSTANE) 0.4-0.3 % SOLN Place 1 drop into both eyes 2 (two) times daily.    . sacubitril-valsartan (ENTRESTO) 97-103 MG Take 1 tablet by mouth 2 (two) times daily. 60 tablet 6   No current facility-administered medications for this encounter.   BP (!) 144/82   Pulse 66   Wt 97.9 kg (215 lb 12.8 oz)   SpO2 95%   BMI 31.87 kg/m   Wt Readings from Last 3 Encounters:  09/10/19 97.9 kg (215 lb 12.8 oz)  05/09/19 98.2 kg (216 lb 9.6 oz)  02/09/19 97.9 kg  (215 lb 13.3 oz)    General:  Well appearing. No resp difficulty HEENT: normal Neck: supple. no JVD. Carotids 2+ bilat; no bruits. No lymphadenopathy or thryomegaly appreciated. Cor: PMI nondisplaced. Regular rate & rhythm. No rubs, gallops or murmurs. Lungs: clear Abdomen: soft, nontender, nondistended. No hepatosplenomegaly. No bruits or masses. Good bowel sounds. Extremities: no cyanosis, clubbing, rash, R and LLE trace edema Neuro: alert & orientedx3, cranial nerves grossly intact. moves all 4 extremities w/o difficulty. Affect pleasant  Assessment/Plan: 1. CAD: LHC in 6/19 with CTOs of LAD and RCA.  Cardiac MRI showed significant myocardial viability. He is s/p CABG x 4 in 9/19 - No chest pain.    - Continue ASA 81 and statin.  2. Chronic systolic CHF: Ischemic cardiomyopathy.  Echo in 6/19 with EF 25-20%.  Cardiac MRI in 7/19 with EF 28%, significant viability noted. RHC in 7/19 showed mildly elevated filling pressure and low cardiac output. Now s/p CABG in 9/19.  Echo post-op in 12/19 showed EF up to 40%. Not on spiro due to pain gynecomastia - Continue Entresto to 97/103 bid.    - Continue Coreg 25 mg bid.  - Continue eplerenone to 50 mg daily.   - Add farxiga 10 mg daily. Discussed purpose of farxiga.  --Set up repeat ECHO.  3. COPD: He has quit smoking since 6/19.  4. Hyperlipidemia: Good lipids on atorvastatin and Vascepa.  5. Chronic cough: No improvement with PPI or inhalers.  Possibly due to COPD.  Not bothersome enough to him for pulmonary referral.  6.Leg Pain, right Set ABIs.    Check BMET, set up ABIs.    Follow up in 3 months with an ECHO   Jenice Leiner 09/10/2019

## 2019-09-19 ENCOUNTER — Ambulatory Visit (HOSPITAL_COMMUNITY)
Admission: RE | Admit: 2019-09-19 | Discharge: 2019-09-19 | Disposition: A | Discharge status: Home / Self Care | Payer: Medicare Other | Source: Ambulatory Visit | Attending: Cardiology | Admitting: Cardiology

## 2019-09-19 ENCOUNTER — Ambulatory Visit (HOSPITAL_BASED_OUTPATIENT_CLINIC_OR_DEPARTMENT_OTHER)
Admission: RE | Admit: 2019-09-19 | Discharge: 2019-09-19 | Disposition: A | Discharge status: Home / Self Care | Payer: Medicare Other | Source: Ambulatory Visit | Attending: Cardiology | Admitting: Cardiology

## 2019-09-19 ENCOUNTER — Other Ambulatory Visit: Payer: Self-pay

## 2019-09-19 DIAGNOSIS — I10 Essential (primary) hypertension: Secondary | ICD-10-CM | POA: Diagnosis not present

## 2019-09-19 DIAGNOSIS — Z951 Presence of aortocoronary bypass graft: Secondary | ICD-10-CM | POA: Insufficient documentation

## 2019-09-19 DIAGNOSIS — Z87891 Personal history of nicotine dependence: Secondary | ICD-10-CM | POA: Insufficient documentation

## 2019-09-19 DIAGNOSIS — I2510 Atherosclerotic heart disease of native coronary artery without angina pectoris: Secondary | ICD-10-CM | POA: Insufficient documentation

## 2019-09-19 DIAGNOSIS — I5022 Chronic systolic (congestive) heart failure: Secondary | ICD-10-CM

## 2019-09-19 DIAGNOSIS — I739 Peripheral vascular disease, unspecified: Secondary | ICD-10-CM | POA: Insufficient documentation

## 2019-09-19 NOTE — Progress Notes (Signed)
Echocardiogram 2D Echocardiogram has been performed.  Oneal Deputy Kalei Mckillop 09/19/2019, 3:23 PM

## 2019-09-21 ENCOUNTER — Telehealth (HOSPITAL_COMMUNITY): Payer: Self-pay

## 2019-09-21 DIAGNOSIS — Z125 Encounter for screening for malignant neoplasm of prostate: Secondary | ICD-10-CM | POA: Diagnosis not present

## 2019-09-21 DIAGNOSIS — R7301 Impaired fasting glucose: Secondary | ICD-10-CM | POA: Diagnosis not present

## 2019-09-21 DIAGNOSIS — I739 Peripheral vascular disease, unspecified: Secondary | ICD-10-CM

## 2019-09-21 DIAGNOSIS — E7849 Other hyperlipidemia: Secondary | ICD-10-CM | POA: Diagnosis not present

## 2019-09-21 NOTE — Telephone Encounter (Signed)
Pt aware of results of echo and ABI.  He was advised of recommendations for referral to Dr Gwenlyn Found in cardiology

## 2019-09-21 NOTE — Telephone Encounter (Signed)
-----   Message from Larey Dresser, MD sent at 09/21/2019 11:03 AM EDT ----- ABIs look more abnormal than the past.  Significant PAD.  Would make PV appt with Dr. Gwenlyn Found or Dr. Fletcher Anon.

## 2019-09-21 NOTE — Telephone Encounter (Signed)
-----   Message from Larey Dresser, MD sent at 09/21/2019 11:02 AM EDT ----- EF 40-45%, stable.

## 2019-09-24 ENCOUNTER — Other Ambulatory Visit (HOSPITAL_COMMUNITY): Payer: Self-pay

## 2019-09-24 DIAGNOSIS — R82998 Other abnormal findings in urine: Secondary | ICD-10-CM | POA: Diagnosis not present

## 2019-09-24 MED ORDER — ATORVASTATIN CALCIUM 40 MG PO TABS: 40.0000 mg | ORAL_TABLET | Freq: Every day | ORAL | 3 refills | Status: DC

## 2019-09-26 DIAGNOSIS — J449 Chronic obstructive pulmonary disease, unspecified: Secondary | ICD-10-CM | POA: Diagnosis not present

## 2019-09-26 DIAGNOSIS — Z0000 Encounter for general adult medical examination without abnormal findings: Secondary | ICD-10-CM | POA: Diagnosis not present

## 2019-09-26 DIAGNOSIS — I5189 Other ill-defined heart diseases: Secondary | ICD-10-CM | POA: Diagnosis not present

## 2019-09-26 DIAGNOSIS — E669 Obesity, unspecified: Secondary | ICD-10-CM | POA: Diagnosis not present

## 2019-09-26 DIAGNOSIS — E7849 Other hyperlipidemia: Secondary | ICD-10-CM | POA: Diagnosis not present

## 2019-09-26 DIAGNOSIS — I779 Disorder of arteries and arterioles, unspecified: Secondary | ICD-10-CM | POA: Diagnosis not present

## 2019-09-26 DIAGNOSIS — I255 Ischemic cardiomyopathy: Secondary | ICD-10-CM | POA: Diagnosis not present

## 2019-09-26 DIAGNOSIS — R972 Elevated prostate specific antigen [PSA]: Secondary | ICD-10-CM | POA: Diagnosis not present

## 2019-09-26 DIAGNOSIS — I739 Peripheral vascular disease, unspecified: Secondary | ICD-10-CM | POA: Diagnosis not present

## 2019-09-26 DIAGNOSIS — E1151 Type 2 diabetes mellitus with diabetic peripheral angiopathy without gangrene: Secondary | ICD-10-CM | POA: Diagnosis not present

## 2019-09-26 DIAGNOSIS — I25810 Atherosclerosis of coronary artery bypass graft(s) without angina pectoris: Secondary | ICD-10-CM | POA: Diagnosis not present

## 2019-09-26 DIAGNOSIS — Z1331 Encounter for screening for depression: Secondary | ICD-10-CM | POA: Diagnosis not present

## 2019-09-28 DIAGNOSIS — Z1212 Encounter for screening for malignant neoplasm of rectum: Secondary | ICD-10-CM | POA: Diagnosis not present

## 2019-10-09 ENCOUNTER — Other Ambulatory Visit: Payer: Self-pay

## 2019-10-09 ENCOUNTER — Encounter: Payer: Self-pay | Admitting: Cardiovascular Disease

## 2019-10-09 ENCOUNTER — Ambulatory Visit (INDEPENDENT_AMBULATORY_CARE_PROVIDER_SITE_OTHER): Payer: Medicare Other | Admitting: Cardiovascular Disease

## 2019-10-09 DIAGNOSIS — I739 Peripheral vascular disease, unspecified: Secondary | ICD-10-CM | POA: Diagnosis not present

## 2019-10-09 NOTE — Progress Notes (Signed)
10/09/2019 [name]   05-Jan-1941  [id number]  Primary Physician System, Pcp Not In Primary Cardiologist: Robert Harp MD Robert [name], [place], [place]  HPI:  Robert [name] is a 79 y.o. 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  He was referred by Darrick Grinder, NP for evaluation of lifestyle limiting claudication.  He does have a history of ischemic cardiomyopathy status post non-STEMI and ultimate CABG x4 by Dr. Darcey Nora.  His EF ultimately improved moderately up to 40% range by 2D echo 12/19.  Other problems include treated hypertension hyperlipidemia.  He has over 100-pack-year tobacco abuse having quit at the time of his open heart surgery.  He does have mild right calf claudication when ambulating around the block 3 times.  His recent Doppler studies revealed a right ankle-brachial index of 0.59 and a left of 0.80.   Current Meds  Medication Sig  . aspirin EC 325 MG EC tablet Take 1 tablet (325 mg total) by mouth daily.  Marland Kitchen atorvastatin (LIPITOR) 40 MG tablet Take 1 tablet (40 mg total) by mouth at bedtime.  . carvedilol (COREG) 25 MG tablet Take 1 tablet (25 mg total) by mouth 2 (two) times daily.  . dapagliflozin propanediol (FARXIGA) 10 MG TABS tablet Take 10 mg by mouth daily before breakfast.  . eplerenone (INSPRA) 50 MG tablet Take 1 tablet (50 mg total) by mouth daily.  Marland Kitchen icosapent Ethyl (VASCEPA) 1 g capsule Take 2 capsules (2 g total) by mouth 2 (two) times daily.  Vladimir Faster Glycol-Propyl Glycol (SYSTANE) 0.4-0.3 % SOLN Place 1 drop into both eyes 2 (two) times daily.  . sacubitril-valsartan (ENTRESTO) 97-103 MG Take 1 tablet by mouth 2 (two) times daily.     Allergies  Allergen Reactions  . Fluorescein Hives and Itching  . Ivp Dye [Iodinated Diagnostic Agents] Hives, Itching and Other (See Comments)    Face got splotchy.    . Typhoid Vaccines Swelling  . Yellow Dye Hives  and Itching    Dye in eye test  . Codeine Other (See Comments)    Puts me to sleep    Social History   Socioeconomic History  . Marital status: Married    Spouse name: Not on file  . Number of children: Not on file  . Years of education: Not on file  . Highest education level: Not on file  Occupational History  . Not on file  Tobacco Use  . Smoking status: Former Smoker    Packs/day: 2.00    Years: 52.00    Pack years: 104.00    Types: Cigarettes    Quit date: 12/18/2017    Years since quitting: 1.8  . Smokeless tobacco: Never Used  Substance and Sexual Activity  . Alcohol use: No  . Drug use: No  . Sexual activity: Never  Other Topics Concern  . Not on file  Social History Narrative  . Not on file   Social Determinants of Health   Financial Resource Strain:   . Difficulty of Paying Living Expenses:   Food Insecurity:   . Worried About Charity fundraiser in the Last Year:   . Arboriculturist in the Last Year:   Transportation Needs:   . Film/video editor (Medical):   Marland Kitchen Lack of Transportation (Non-Medical):   Physical Activity:   . Days of Exercise per Week:   . Minutes of Exercise per  Session:   Stress:   . Feeling of Stress :   Social Connections:   . Frequency of Communication with Friends and Family:   . Frequency of Social Gatherings with Friends and Family:   . Attends Religious Services:   . Active Member of Clubs or Organizations:   . Attends Archivist Meetings:   Marland Kitchen Marital Status:   Intimate Partner Violence:   . Fear of Current or Ex-Partner:   . Emotionally Abused:   Marland Kitchen Physically Abused:   . Sexually Abused:      Review of Systems: General: negative for chills, fever, night sweats or weight changes.  Cardiovascular: negative for chest pain, dyspnea on exertion, edema, orthopnea, palpitations, paroxysmal nocturnal dyspnea or shortness of breath Dermatological: negative for rash Respiratory: negative for cough or wheezing  Urologic: negative for hematuria Abdominal: negative for nausea, vomiting, diarrhea, bright red blood per rectum, melena, or hematemesis Neurologic: negative for visual changes, syncope, or dizziness All other systems reviewed and are otherwise negative except as noted above.    Blood pressure (!) 124/56, pulse 62, height 5' 10.5" (1.791 m), weight 213 lb (96.6 kg).  General appearance: alert and no distress Neck: no adenopathy, no carotid bruit, no JVD, supple, symmetrical, trachea midline and thyroid not enlarged, symmetric, no tenderness/mass/nodules Lungs: clear to auscultation bilaterally Heart: regular rate and rhythm, S1, S2 normal, no murmur, click, rub or gallop Extremities: extremities normal, atraumatic, no cyanosis or edema Pulses: Reduced pedal pulses bilaterally Skin: Skin color, texture, turgor normal. No rashes or lesions Neurologic: Alert and oriented X 3, normal strength and tone. Normal symmetric reflexes. Normal coordination and gait  EKG not performed today  ASSESSMENT AND PLAN:   Peripheral arterial disease Robert [name]) Mr. Reeves was referred to me by Darrick Grinder, NP for evaluation of lifestyle limiting claudication.  He is a cardiology patient of Dr. Claris Gladden .  He does have ischemic cardiomyopathy status post bypass surgery 2 years ago.  He has some mild right calf claudication with recent lower extremity arterial Doppler study/segmental pressures performed 09/19/2019 revealing a right ABI of 0.59 and a left of 0.80.  At this point, his symptoms are not severe enough to warrant intervention from his point of view.  I will see him back as needed.  If he decides he wishes this addressed I am happy to oblige.      Robert Harp MD FACP,FACC,FAHA, [facility] 10/09/2019 3:06 PM

## 2019-10-09 NOTE — Assessment & Plan Note (Signed)
Robert [name] was referred to me by Darrick Grinder, NP for evaluation of lifestyle limiting claudication.  He is a cardiology patient of Dr. Claris Gladden .  He does have ischemic cardiomyopathy status post bypass surgery 2 years ago.  He has some mild right calf claudication with recent lower extremity arterial Doppler study/segmental pressures performed 09/19/2019 revealing a right ABI of 0.59 and a left of 0.80.  At this point, his symptoms are not severe enough to warrant intervention from his point of view.  I will see him back as needed.  If he decides he wishes this addressed I am happy to oblige.

## 2019-10-09 NOTE — Patient Instructions (Signed)
Your physician recommends that you continue on your current medications as directed. Please refer to the Current Medication list given to you today.    Your physician recommends that you schedule a follow-up appointment in:  AS NEEDED 

## 2019-11-07 DIAGNOSIS — R972 Elevated prostate specific antigen [PSA]: Secondary | ICD-10-CM | POA: Diagnosis not present

## 2019-11-08 ENCOUNTER — Telehealth (HOSPITAL_COMMUNITY): Payer: Self-pay | Admitting: *Deleted

## 2019-11-08 NOTE — Telephone Encounter (Signed)
I think that he should see a dermatologist if this has been persistent for months.  It may or may not be medication-related.  Could be an unrelated dermatitis.  Would see dermatologist first before stopping any medications.

## 2019-11-08 NOTE — Telephone Encounter (Signed)
Pt called stating hes had a rash and itchiness since starting eplerenone. Pt said itching has gradually worsened over the months. Pt was recently prescribed farxiga and the itching is worse and his ears are very dry and itchy.   Routed to [place] for advice

## 2019-11-09 NOTE — Telephone Encounter (Signed)
Left VM requesting return call.

## 2019-11-13 ENCOUNTER — Other Ambulatory Visit (HOSPITAL_COMMUNITY): Payer: Self-pay

## 2019-11-13 MED ORDER — CARVEDILOL 25 MG PO TABS: 25.0000 mg | ORAL_TABLET | Freq: Two times a day (BID) | ORAL | 6 refills | Status: DC

## 2019-11-14 NOTE — Telephone Encounter (Signed)
Pt does not wish to see dermatology he really thinks the itching is coming from eplerenone. Pt would like to stop medication and see if it clears up.

## 2019-11-14 NOTE — Telephone Encounter (Signed)
Spoke with patient he will call me in a week and let me know if itching has stopped/

## 2019-11-14 NOTE — Telephone Encounter (Signed)
He can stop for 1 week and if itching does not stop, restart it.

## 2019-11-22 ENCOUNTER — Telehealth (HOSPITAL_COMMUNITY): Payer: Self-pay | Admitting: *Deleted

## 2019-11-22 NOTE — Telephone Encounter (Signed)
Pt left VM stating he needed a call back about some of his medications. I called pt back no answer/left vm requesting return.

## 2019-11-27 ENCOUNTER — Telehealth (HOSPITAL_COMMUNITY): Payer: Self-pay | Admitting: *Deleted

## 2019-11-27 NOTE — Telephone Encounter (Signed)
Pt left vm requesting call about medication. I called pt no answer/left vm .

## 2019-12-10 ENCOUNTER — Telehealth (HOSPITAL_COMMUNITY): Payer: Self-pay | Admitting: *Deleted

## 2019-12-10 NOTE — Telephone Encounter (Signed)
Pt left vm requesting a return call about a medication. I called pt back no answer/left vm for pt to return my call.

## 2019-12-13 ENCOUNTER — Other Ambulatory Visit: Payer: Self-pay

## 2019-12-13 ENCOUNTER — Ambulatory Visit (HOSPITAL_COMMUNITY)
Admission: RE | Admit: 2019-12-13 | Discharge: 2019-12-13 | Disposition: A | Discharge status: Home / Self Care | Payer: Medicare Other | Source: Ambulatory Visit | Attending: Cardiology | Admitting: Cardiology

## 2019-12-13 ENCOUNTER — Encounter (HOSPITAL_COMMUNITY): Payer: Self-pay | Admitting: Cardiology

## 2019-12-13 VITALS — BP 120/68 | HR 61 | Ht 70.5 in | Wt 211.0 lb

## 2019-12-13 DIAGNOSIS — I739 Peripheral vascular disease, unspecified: Secondary | ICD-10-CM | POA: Diagnosis not present

## 2019-12-13 DIAGNOSIS — I5022 Chronic systolic (congestive) heart failure: Secondary | ICD-10-CM | POA: Diagnosis not present

## 2019-12-13 DIAGNOSIS — I2510 Atherosclerotic heart disease of native coronary artery without angina pectoris: Secondary | ICD-10-CM

## 2019-12-13 LAB — LIPID PANEL
Cholesterol: 89 mg/dL (ref 0–200)
HDL: 25 mg/dL — ABNORMAL LOW (ref >40)
LDL Cholesterol: 35 mg/dL (ref 0–99)
Total CHOL/HDL Ratio: 3.6 RATIO
Triglycerides: 146 mg/dL (ref <150)
VLDL: 29 mg/dL (ref 0–40)

## 2019-12-13 LAB — BASIC METABOLIC PANEL
Anion gap: 8 (ref 5–15)
BUN: 15 mg/dL (ref 8–23)
CO2: 25 mmol/L (ref 22–32)
Calcium: 8.8 mg/dL — ABNORMAL LOW (ref 8.9–10.3)
Chloride: 107 mmol/L (ref 98–111)
Creatinine, Ser: 1.13 mg/dL (ref 0.61–1.24)
GFR calc Af Amer: >60 mL/min (ref >60)
GFR calc non Af Amer: >60 mL/min (ref >60)
Glucose, Bld: 83 mg/dL (ref 70–99)
Potassium: 4.2 mmol/L (ref 3.5–5.1)
Sodium: 140 mmol/L (ref 135–145)

## 2019-12-13 NOTE — Patient Instructions (Addendum)
Continue to stay off [place] for now  Labs done today, we will call you for abnormal results  PLEASE FOLLOW UP WITH YOUR PRIMARY CARE PROVIDER ABOUT YOUR RASH AND POSSIBLE REFERRAL TO DERMATOLOGY  Your physician recommends that you schedule a follow-up appointment in: 3 months

## 2019-12-15 NOTE — Progress Notes (Signed)
HF Cardiology: Dr. Aundra [name] PCP: Dr. Forde [name]  79 y.o. with history of CAD and ischemic cardiomyopathy presents for followup of CHF. Patient had no cardiac history prior to 6/19. However, since around 12/18, he had noted exertional dyspnea.  In 6/19, [place].  He developed very severe dyspnea and went to the hospital in [place] where he was found to have NSTEMI.  LHC was done, showing chronic occlusions of the LAD and RCA with collaterals.  Echo showed EF 25-30%.  No intervention, he was eventually discharged to come home.  He has been seen by Dr. Prescott [name] for CABG evaluation.  Cardiac MRI in 6/19 showed EF 28% with substantial viability.  RHC was done in 7/19, showing mildly elevated filling pressures but low cardiac output, with CI 1.7.  After RHC, I increased his Lasix and added digoxin.    Of note, he has quit smoking since 6/19.   He was admitted for CABG in 9/19.  Volume overload post-op, treated with diuresis.    Echo in 12/19 showed EF 40%, mid anteroseptal and mid inferoseptal akinesis.   He had painful gynecomastia with spironolactone and switched to eplerenone but did not resolve.  Now off eplerenone with resolution of symptoms.   Echo in 3/21 showed EF 40-45%, mild LVH, normal RV.   ABIs in 3/21 showed moderate PAD, he saw Dr. Alvester [name] with plan for conservative management for now.   Main complaint today is a macular erythematous rash primarily across his upper chest and the axillae.  He stopped dapagliflozin thinking this was the cause, but this did not help.  Symptomatically, he has otherwise been doing ok.  No dyspnea walking on flat ground.  No orthopnea/PND.  No chest pain.  He reports some generalized fatigue.  He has minimal claudication symptoms despite PAD and reports no pedal ulcerations.   He has elevated PSA that is being evaluated.   ECG (personally reviewed): NSR, lateral TWIs.   Labs (7/19): hgb 14.1, K 4.1, creatinine 1.26, LDL 28 Labs  (9/19): K 3.7, creatinine 1.09, LFTs normal, hgb 12.6 Labs (11/19): K 4.4, creatinine 1.31 Labs (12/19): K 4.4, creatinine 1.19 Labs (3/20): K 4.5, creatinine 1.12 Labs (5/20): LDL 41, HDL 27, TGs 176  Labs (8/20): K 4.9, creatinine 1.12 Labs (3/21): K 4.5, creatinine 1.14  PMH: 1. H/o retinal detachment 2. H/o transverse myelitis (remote) 3. CAD: NSTEMI 6/19 in [place]. - LHC (6/19): Chronic total occlusion of LAD and chronic total occlusion of RCA with collaterals.  - CABG (9/19): LIMA-LAD, SVG-D, SVG-ramus, SVG-PDA.  4. Chronic systolic CHF: Ischemic cardiomyopathy.   - Echo (6/19): LV moderately dilated, EF 25-30%, mild MR.  - Cardiac MRI (6/19): EF 28%, significant viability noted.  - RHC (7/19): mean RA 8, PA 40/16 mean 26, mean PCWP 23, CI 1.7, PVR 0.83 WU - Echo (12/19): EF 40%, mid inferoseptal and mid anteroseptal akinesis, inferior hypokinesis, normal RV size and systolic function.  - Painful gynecomastia with spironolactone and eplerenone. - Echo (3/21): EF 40-45%, mild LVH, normal RV  5. COPD: PFTs (9/19) with moderate obstruction.  6. PAD: ABIs (3/21) with 0.59 on right, 0.8 on left.   SH: Married, lives in [place].  He quit smoking in 6/19.    Family History  Problem Relation Age of Onset  . Heart disease Mother   . Cancer Mother   . Heart attack Father    ROS: All systems reviewed and negative except as per HPI.   Current  Outpatient Medications  Medication Sig Dispense Refill  . aspirin EC 325 MG EC tablet Take 1 tablet (325 mg total) by mouth daily. 30 tablet 0  . atorvastatin (LIPITOR) 40 MG tablet Take 1 tablet (40 mg total) by mouth at bedtime. 90 tablet 3  . carvedilol (COREG) 25 MG tablet Take 1 tablet (25 mg total) by mouth 2 (two) times daily. 60 tablet 6  . icosapent Ethyl (VASCEPA) 1 g capsule Take 2 capsules (2 g total) by mouth 2 (two) times daily. 360 capsule 3  . Polyethyl Glycol-Propyl Glycol (SYSTANE) 0.4-0.3 % SOLN Place 1 drop into  both eyes 2 (two) times daily.    . sacubitril-valsartan (ENTRESTO) 97-103 MG Take 1 tablet by mouth 2 (two) times daily. 60 tablet 6   No current facility-administered medications for this encounter.   BP 120/68   Pulse 61   Ht 5' 10.5" (1.791 m)   Wt 95.7 kg (211 lb)   SpO2 94%   BMI 29.85 kg/m  General: NAD Neck: No JVD, no thyromegaly or thyroid nodule.  Lungs: Clear to auscultation bilaterally with normal respiratory effort. CV: Nondisplaced PMI.  Heart regular S1/S2, no S3/S4, no murmur.  No peripheral edema.  No carotid bruit.  Unable to palpate pedal pulses.  Abdomen: Soft, nontender, no hepatosplenomegaly, no distention.  Skin: Macular erythematous rash across upper chest and axillae.   Neurologic: Alert and oriented x 3.  Psych: Normal affect. Extremities: No clubbing or cyanosis.  HEENT: Normal.   Assessment/Plan: 1. CAD: LHC in 6/19 with CTOs of LAD and RCA.  Cardiac MRI showed significant myocardial viability. He is s/p CABG x 4 in 9/19.  No chest pain.  - Continue ASA 81 and statin.  2. Chronic systolic CHF: Ischemic cardiomyopathy.  Echo in 6/19 with EF 25-20%.  Cardiac MRI in 7/19 with EF 28%, significant viability noted. RHC in 7/19 showed mildly elevated filling pressure and low cardiac output. Now s/p CABG in 9/19.  Echo post-op in 12/19 showed EF up to 40%.  3/21 echo with EF up to 40-45%.  Doing well today, NYHA class II symptoms and not volume overloaded on exam.  Painful gynecomastia with spironolactone and eplerenone.  - Continue Entresto to 97/103 bid.   BMET today.  - Continue Coreg 25 mg bid.  - He stopped dapagliflozin because of his rash, but says that he had the rash prior to starting dapagliflozin.  Can likely restart dapagliflozin in future but would like to see the rash resolving first.   - EF is out of range of ICD.  3. COPD: He has quit smoking since 6/19.  4. Hyperlipidemia: Continue atorvastatin and Vascepa, check lipids today.  5. Rash:  Recommended that he see dermatology, will need to have referral from his PCP.   Followup in 3 months.   [name] 12/15/2019

## 2020-01-21 DIAGNOSIS — Z23 Encounter for immunization: Secondary | ICD-10-CM | POA: Diagnosis not present

## 2020-01-23 DIAGNOSIS — E669 Obesity, unspecified: Secondary | ICD-10-CM | POA: Diagnosis not present

## 2020-01-23 DIAGNOSIS — E1151 Type 2 diabetes mellitus with diabetic peripheral angiopathy without gangrene: Secondary | ICD-10-CM | POA: Diagnosis not present

## 2020-01-23 DIAGNOSIS — I10 Essential (primary) hypertension: Secondary | ICD-10-CM | POA: Diagnosis not present

## 2020-01-23 DIAGNOSIS — I25810 Atherosclerosis of coronary artery bypass graft(s) without angina pectoris: Secondary | ICD-10-CM | POA: Diagnosis not present

## 2020-01-23 DIAGNOSIS — R972 Elevated prostate specific antigen [PSA]: Secondary | ICD-10-CM | POA: Diagnosis not present

## 2020-01-23 DIAGNOSIS — I779 Disorder of arteries and arterioles, unspecified: Secondary | ICD-10-CM | POA: Diagnosis not present

## 2020-01-23 DIAGNOSIS — I739 Peripheral vascular disease, unspecified: Secondary | ICD-10-CM | POA: Diagnosis not present

## 2020-01-23 DIAGNOSIS — E7849 Other hyperlipidemia: Secondary | ICD-10-CM | POA: Diagnosis not present

## 2020-02-12 ENCOUNTER — Other Ambulatory Visit (HOSPITAL_COMMUNITY): Payer: Self-pay | Admitting: Cardiology

## 2020-02-18 DIAGNOSIS — Z23 Encounter for immunization: Secondary | ICD-10-CM | POA: Diagnosis not present

## 2020-03-03 ENCOUNTER — Other Ambulatory Visit: Payer: Self-pay

## 2020-03-03 ENCOUNTER — Encounter (INDEPENDENT_AMBULATORY_CARE_PROVIDER_SITE_OTHER): Payer: Medicare Other | Admitting: Ophthalmology

## 2020-03-03 DIAGNOSIS — H35343 Macular cyst, hole, or pseudohole, bilateral: Secondary | ICD-10-CM

## 2020-03-03 DIAGNOSIS — H59033 Cystoid macular edema following cataract surgery, bilateral: Secondary | ICD-10-CM

## 2020-03-07 DIAGNOSIS — D1801 Hemangioma of skin and subcutaneous tissue: Secondary | ICD-10-CM | POA: Diagnosis not present

## 2020-03-07 DIAGNOSIS — R972 Elevated prostate specific antigen [PSA]: Secondary | ICD-10-CM | POA: Diagnosis not present

## 2020-03-07 DIAGNOSIS — L218 Other seborrheic dermatitis: Secondary | ICD-10-CM | POA: Diagnosis not present

## 2020-03-07 DIAGNOSIS — D224 Melanocytic nevi of scalp and neck: Secondary | ICD-10-CM | POA: Diagnosis not present

## 2020-03-07 DIAGNOSIS — L820 Inflamed seborrheic keratosis: Secondary | ICD-10-CM | POA: Diagnosis not present

## 2020-03-07 DIAGNOSIS — L821 Other seborrheic keratosis: Secondary | ICD-10-CM | POA: Diagnosis not present

## 2020-03-07 DIAGNOSIS — D225 Melanocytic nevi of trunk: Secondary | ICD-10-CM | POA: Diagnosis not present

## 2020-03-07 DIAGNOSIS — L309 Dermatitis, unspecified: Secondary | ICD-10-CM | POA: Diagnosis not present

## 2020-03-07 DIAGNOSIS — B078 Other viral warts: Secondary | ICD-10-CM | POA: Diagnosis not present

## 2020-03-24 ENCOUNTER — Ambulatory Visit (HOSPITAL_COMMUNITY)
Admission: RE | Admit: 2020-03-24 | Discharge: 2020-03-24 | Disposition: A | Discharge status: Home / Self Care | Payer: Medicare Other | Source: Ambulatory Visit | Attending: Cardiology | Admitting: Cardiology

## 2020-03-24 ENCOUNTER — Other Ambulatory Visit: Payer: Self-pay

## 2020-03-24 VITALS — BP 135/90 | HR 67 | Wt 216.4 lb

## 2020-03-24 DIAGNOSIS — I5022 Chronic systolic (congestive) heart failure: Secondary | ICD-10-CM | POA: Diagnosis not present

## 2020-03-24 DIAGNOSIS — Z87891 Personal history of nicotine dependence: Secondary | ICD-10-CM | POA: Diagnosis not present

## 2020-03-24 DIAGNOSIS — Z79899 Other long term (current) drug therapy: Secondary | ICD-10-CM | POA: Diagnosis not present

## 2020-03-24 DIAGNOSIS — I255 Ischemic cardiomyopathy: Secondary | ICD-10-CM | POA: Insufficient documentation

## 2020-03-24 DIAGNOSIS — Z7982 Long term (current) use of aspirin: Secondary | ICD-10-CM | POA: Insufficient documentation

## 2020-03-24 DIAGNOSIS — Z8249 Family history of ischemic heart disease and other diseases of the circulatory system: Secondary | ICD-10-CM | POA: Diagnosis not present

## 2020-03-24 DIAGNOSIS — Z951 Presence of aortocoronary bypass graft: Secondary | ICD-10-CM | POA: Insufficient documentation

## 2020-03-24 DIAGNOSIS — I2510 Atherosclerotic heart disease of native coronary artery without angina pectoris: Secondary | ICD-10-CM | POA: Insufficient documentation

## 2020-03-24 DIAGNOSIS — J449 Chronic obstructive pulmonary disease, unspecified: Secondary | ICD-10-CM | POA: Insufficient documentation

## 2020-03-24 DIAGNOSIS — I739 Peripheral vascular disease, unspecified: Secondary | ICD-10-CM | POA: Diagnosis not present

## 2020-03-24 DIAGNOSIS — E785 Hyperlipidemia, unspecified: Secondary | ICD-10-CM | POA: Diagnosis not present

## 2020-03-24 DIAGNOSIS — I252 Old myocardial infarction: Secondary | ICD-10-CM | POA: Diagnosis not present

## 2020-03-24 LAB — BASIC METABOLIC PANEL
Anion gap: 9 (ref 5–15)
BUN: 14 mg/dL (ref 8–23)
CO2: 25 mmol/L (ref 22–32)
Calcium: 9 mg/dL (ref 8.9–10.3)
Chloride: 104 mmol/L (ref 98–111)
Creatinine, Ser: 1.12 mg/dL (ref 0.61–1.24)
GFR calc Af Amer: >60 mL/min (ref >60)
GFR calc non Af Amer: >60 mL/min (ref >60)
Glucose, Bld: 97 mg/dL (ref 70–99)
Potassium: 4.3 mmol/L (ref 3.5–5.1)
Sodium: 138 mmol/L (ref 135–145)

## 2020-03-24 NOTE — Patient Instructions (Signed)
Labs done today, we will call you for abnormal results  Please call our office in January to schedule your follow up appointment  If you have any questions or concerns before your next appointment please send [place] a message through [place] or call our office at [phone].    TO LEAVE A MESSAGE FOR THE NURSE SELECT OPTION 2, PLEASE LEAVE A MESSAGE INCLUDING: . YOUR NAME . DATE OF BIRTH . CALL BACK NUMBER . REASON FOR CALL**this is important as we prioritize the call backs  [place] AS LONG AS YOU CALL BEFORE 4:00 PM  At the [facility], you and your health needs are our priority. As part of our continuing mission to provide you with exceptional heart care, we have created designated Provider Care Teams. These Care Teams include your primary Cardiologist (physician) and Advanced Practice Providers (APPs- Physician Assistants and Nurse Practitioners) who all work together to provide you with the care you need, when you need it.   You may see any of the following providers on your designated Care Team at your next follow up: Marland Kitchen Dr Glori Bickers . Dr [name] . Darrick Grinder, NP . Lyda Jester, PA . Audry Riles, PharmD   Please be sure to bring in all your medications bottles to every appointment.

## 2020-03-24 NOTE — Progress Notes (Signed)
[facility] Cardiology: Dr. Aundra Dubin PCP: Dr. Forde Dandy  79 y.o. with history of CAD and ischemic cardiomyopathy presents for followup of CHF. Patient had no cardiac history prior to 6/19. However, since around 12/18, he had noted exertional dyspnea.  In 6/19, [place].  He developed very severe dyspnea and went to the hospital in [place] where he was found to have NSTEMI.  LHC was done, showing chronic occlusions of the LAD and RCA with collaterals.  Echo showed EF 25-30%.  No intervention, he was eventually discharged to come home.  He has been seen by Dr. Prescott Gum for CABG evaluation.  Cardiac MRI in 6/19 showed EF 28% with substantial viability.  RHC was done in 7/19, showing mildly elevated filling pressures but low cardiac output, with CI 1.7.  After RHC, I increased his Lasix and added digoxin.    Of note, he has quit smoking since 6/19.   He was admitted for CABG in 9/19.  Volume overload post-op, treated with diuresis.    Echo in 12/19 showed EF 40%, mid anteroseptal and mid inferoseptal akinesis.   He had painful gynecomastia with spironolactone and switched to eplerenone but did not resolve.  Now off eplerenone with resolution of symptoms.   Echo in 3/21 showed EF 40-45%, mild LVH, normal RV.   ABIs in 3/21 showed moderate PAD, he saw Dr. Alvester Chou with plan for conservative management for now.   He is doing well today.  Weight is up about 5 lbs, which he attributes to poor diet and lack of exercise. He continues to work part time for a [employer]. Rash has resolved.  No chest pain.  Dyspnea only with heavy exertion.  No problems walking on flat ground.  No problems with a flight of stairs.  No claudication with normal activity.  He will get cramps in his right calf after walking about a mile.   ECG (personally reviewed): NSR, lateral TWIs  Labs (7/19): hgb 14.1, K 4.1, creatinine 1.26, LDL 28 Labs (9/19): K 3.7, creatinine 1.09, LFTs normal, hgb 12.6 Labs  (11/19): K 4.4, creatinine 1.31 Labs (12/19): K 4.4, creatinine 1.19 Labs (3/20): K 4.5, creatinine 1.12 Labs (5/20): LDL 41, HDL 27, TGs 176  Labs (8/20): K 4.9, creatinine 1.12 Labs (3/21): K 4.5, creatinine 1.14 Labs (6/21): K 4.2, creatinine 1.13, LDL 35, TGs 142  PMH: 1. H/o retinal detachment 2. H/o transverse myelitis (remote) 3. CAD: NSTEMI 6/19 in [place]. - LHC (6/19): Chronic total occlusion of LAD and chronic total occlusion of RCA with collaterals.  - CABG (9/19): LIMA-LAD, SVG-D, SVG-ramus, SVG-PDA.  4. Chronic systolic CHF: Ischemic cardiomyopathy.   - Echo (6/19): LV moderately dilated, EF 25-30%, mild MR.  - Cardiac MRI (6/19): EF 28%, significant viability noted.  - RHC (7/19): mean RA 8, PA 40/16 mean 26, mean PCWP 23, CI 1.7, PVR 0.83 WU - Echo (12/19): EF 40%, mid inferoseptal and mid anteroseptal akinesis, inferior hypokinesis, normal RV size and systolic function.  - Painful gynecomastia with spironolactone and eplerenone. - Echo (3/21): EF 40-45%, mild LVH, normal RV  5. COPD: PFTs (9/19) with moderate obstruction.  6. PAD: ABIs (3/21) with 0.59 on right, 0.8 on left.   SH: Married, lives in [place].  He quit smoking in 6/19.    Family History  Problem Relation Age of Onset  . Heart disease Mother   . Cancer Mother   . Heart attack Father    ROS: All systems reviewed and  negative except as per HPI.   Current Outpatient Medications  Medication Sig Dispense Refill  . aspirin EC 325 MG EC tablet Take 1 tablet (325 mg total) by mouth daily. 30 tablet 0  . atorvastatin (LIPITOR) 40 MG tablet Take 1 tablet (40 mg total) by mouth at bedtime. 90 tablet 3  . carvedilol (COREG) 25 MG tablet Take 1 tablet (25 mg total) by mouth 2 (two) times daily. 60 tablet 6  . ENTRESTO 97-103 MG TAKE 1 TABLET BY MOUTH TWICE DAILY 60 tablet 5  . icosapent Ethyl (VASCEPA) 1 g capsule Take 2 capsules (2 g total) by mouth 2 (two) times daily. 360 capsule 3  . Polyethyl  Glycol-Propyl Glycol (SYSTANE) 0.4-0.3 % SOLN Place 1 drop into both eyes 2 (two) times daily.     No current facility-administered medications for this encounter.   BP 135/90   Pulse 67   Wt 98.2 kg (216 lb 6.4 oz)   SpO2 95%   BMI 30.61 kg/m  General: NAD Neck: No JVD, no thyromegaly or thyroid nodule.  Lungs: Clear to auscultation bilaterally with normal respiratory effort. CV: Nondisplaced PMI.  Heart regular S1/S2, no S3/S4, no murmur.  No peripheral edema.  No carotid bruit.  Unable to palpate pedal pulses.  Abdomen: Soft, nontender, no hepatosplenomegaly, no distention.  Skin: Intact without lesions or rashes.  Neurologic: Alert and oriented x 3.  Psych: Normal affect. Extremities: No clubbing or cyanosis.  HEENT: Normal.   Assessment/Plan: 1. CAD: LHC in 6/19 with CTOs of LAD and RCA.  Cardiac MRI showed significant myocardial viability. He is s/p CABG x 4 in 9/19.  No chest pain.  - Continue ASA 81 and statin.  2. Chronic systolic CHF: Ischemic cardiomyopathy.  Echo in 6/19 with EF 25-20%.  Cardiac MRI in 7/19 with EF 28%, significant viability noted. RHC in 7/19 showed mildly elevated filling pressure and low cardiac output. Now s/p CABG in 9/19.  Echo post-op in 12/19 showed EF up to 40%.  3/21 echo with EF up to 40-45%.  Doing well today, NYHA class II symptoms and not volume overloaded on exam.  Painful gynecomastia with spironolactone and eplerenone. Did tolerate Wilder Glade due to urinary urgency.  - Continue Entresto to 97/103 bid.   BMET today.  - Continue Coreg 25 mg bid.  - He does not want to retry Farxiga due to significant urinary symptoms.  - EF is out of range of ICD.  3. COPD: He has quit smoking since 6/19.  4. Hyperlipidemia: Continue atorvastatin and Vascepa, good lipids in 6/21.  5. PAD: Mild claudication.  He saw Dr. Gwenlyn Found, plan for medical management for now.    Followup in 4 months.   [name] 03/24/2020

## 2020-05-12 ENCOUNTER — Other Ambulatory Visit (HOSPITAL_COMMUNITY): Payer: Self-pay | Admitting: Cardiology

## 2020-05-13 DIAGNOSIS — M5416 Radiculopathy, lumbar region: Secondary | ICD-10-CM | POA: Diagnosis not present

## 2020-05-14 ENCOUNTER — Other Ambulatory Visit (HOSPITAL_COMMUNITY): Payer: Self-pay | Admitting: Cardiology

## 2020-05-23 DIAGNOSIS — I5022 Chronic systolic (congestive) heart failure: Secondary | ICD-10-CM | POA: Diagnosis not present

## 2020-05-23 DIAGNOSIS — I255 Ischemic cardiomyopathy: Secondary | ICD-10-CM | POA: Diagnosis not present

## 2020-05-23 DIAGNOSIS — E669 Obesity, unspecified: Secondary | ICD-10-CM | POA: Diagnosis not present

## 2020-05-23 DIAGNOSIS — I739 Peripheral vascular disease, unspecified: Secondary | ICD-10-CM | POA: Diagnosis not present

## 2020-05-23 DIAGNOSIS — J449 Chronic obstructive pulmonary disease, unspecified: Secondary | ICD-10-CM | POA: Diagnosis not present

## 2020-05-23 DIAGNOSIS — R972 Elevated prostate specific antigen [PSA]: Secondary | ICD-10-CM | POA: Diagnosis not present

## 2020-05-23 DIAGNOSIS — E785 Hyperlipidemia, unspecified: Secondary | ICD-10-CM | POA: Diagnosis not present

## 2020-05-23 DIAGNOSIS — N1831 Chronic kidney disease, stage 3a: Secondary | ICD-10-CM | POA: Diagnosis not present

## 2020-05-23 DIAGNOSIS — I10 Essential (primary) hypertension: Secondary | ICD-10-CM | POA: Diagnosis not present

## 2020-05-23 DIAGNOSIS — I25810 Atherosclerosis of coronary artery bypass graft(s) without angina pectoris: Secondary | ICD-10-CM | POA: Diagnosis not present

## 2020-05-23 DIAGNOSIS — E1151 Type 2 diabetes mellitus with diabetic peripheral angiopathy without gangrene: Secondary | ICD-10-CM | POA: Diagnosis not present

## 2020-05-27 DIAGNOSIS — H35373 Puckering of macula, bilateral: Secondary | ICD-10-CM | POA: Diagnosis not present

## 2020-05-27 DIAGNOSIS — H35343 Macular cyst, hole, or pseudohole, bilateral: Secondary | ICD-10-CM | POA: Diagnosis not present

## 2020-05-27 DIAGNOSIS — H26491 Other secondary cataract, right eye: Secondary | ICD-10-CM | POA: Diagnosis not present

## 2020-06-11 ENCOUNTER — Other Ambulatory Visit (HOSPITAL_COMMUNITY): Payer: Self-pay | Admitting: *Deleted

## 2020-06-11 MED ORDER — ICOSAPENT ETHYL 1 G PO CAPS: 2.0000 g | ORAL_CAPSULE | Freq: Two times a day (BID) | ORAL | 3 refills | Status: DC

## 2020-06-11 MED ORDER — ATORVASTATIN CALCIUM 40 MG PO TABS: 40.0000 mg | ORAL_TABLET | Freq: Every day | ORAL | 3 refills | Status: DC

## 2020-06-11 MED ORDER — CARVEDILOL 25 MG PO TABS: ORAL_TABLET | ORAL | 6 refills | Status: DC

## 2020-06-25 IMAGING — CR DG CHEST 2V
2 series · 2 of 2 positions shown · non-contrast
Comparison: 04/02/2014, 03/08/2013

CLINICAL DATA: 76-year-old male with a history of pulmonary
infection, congestion

EXAM:
CHEST - 2 VIEW

[w chest pa]
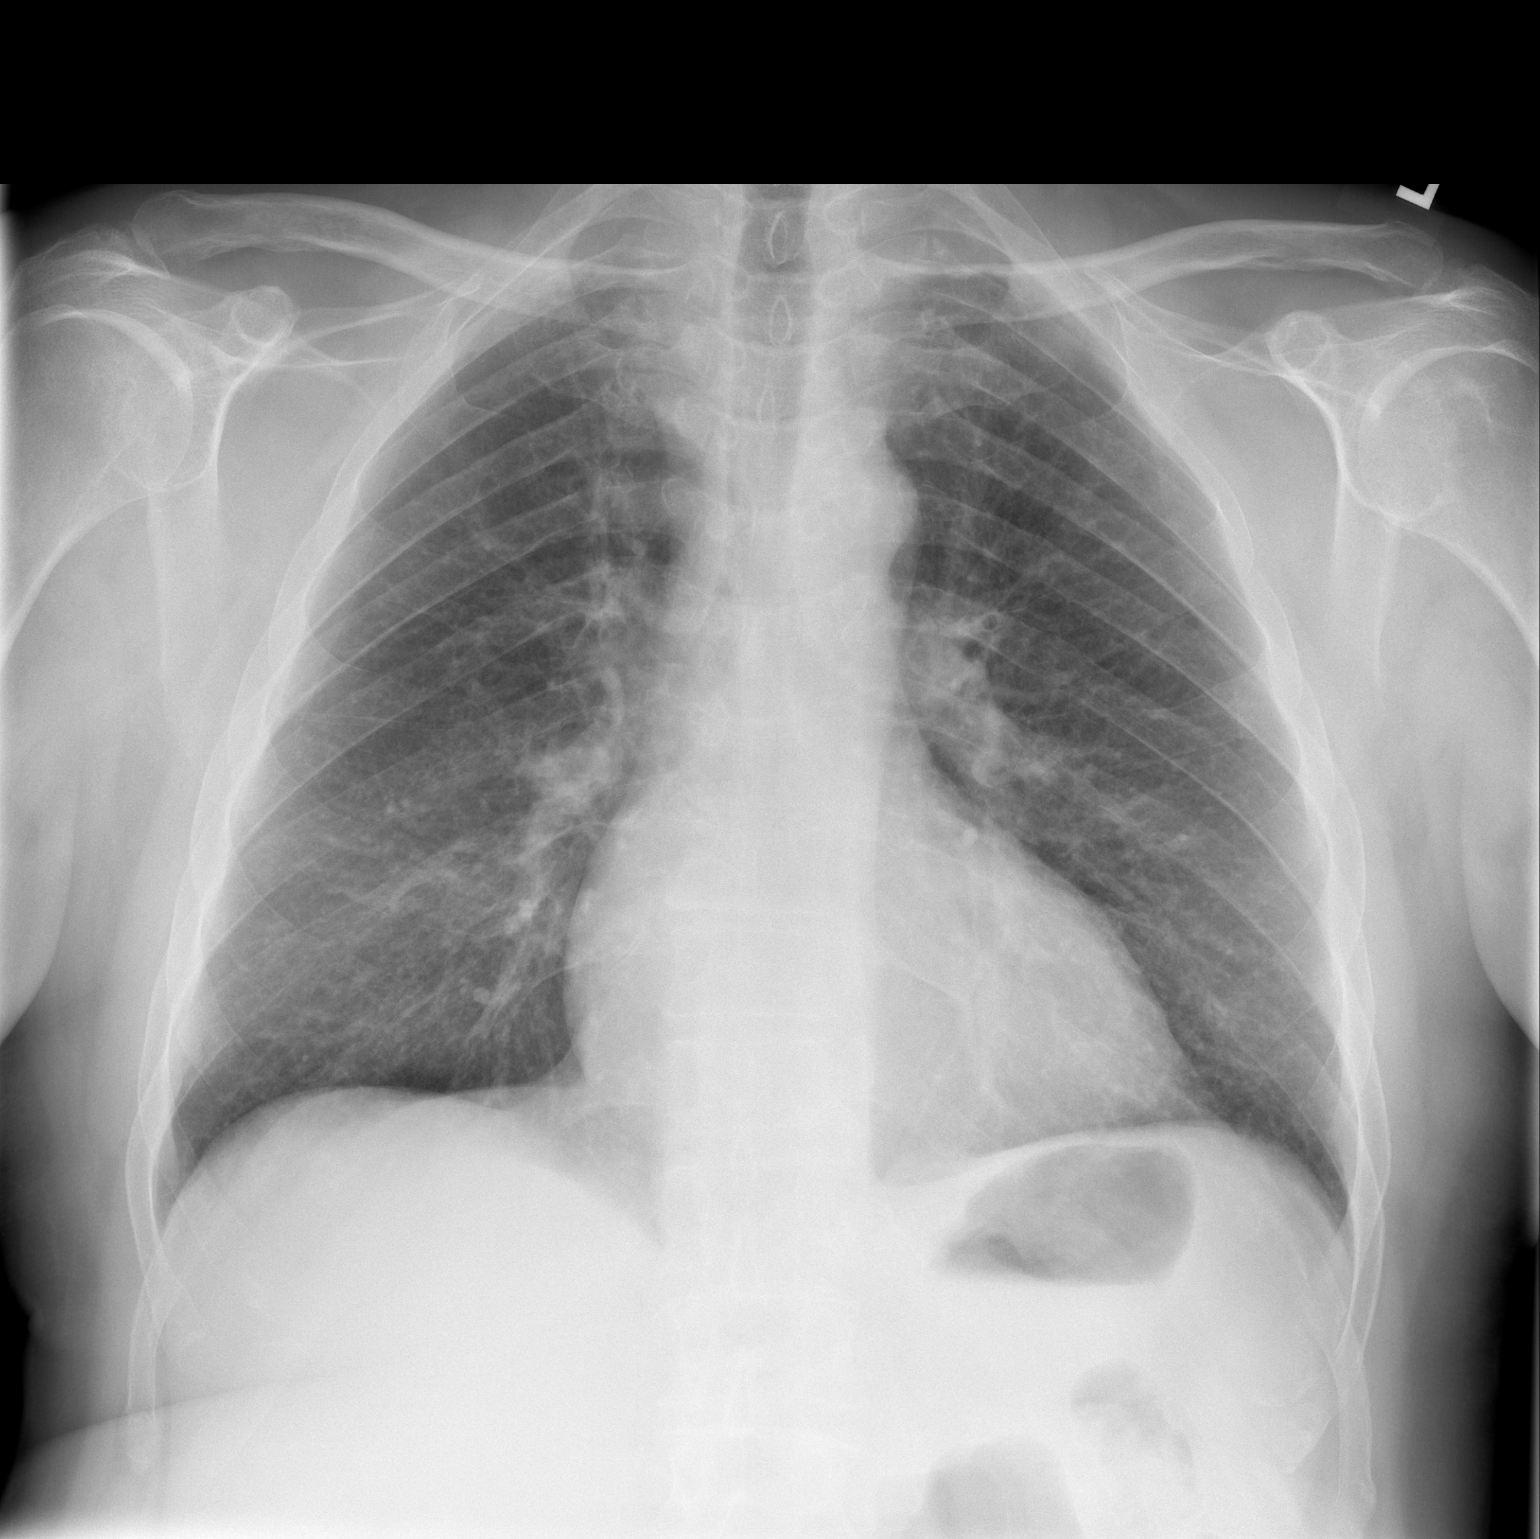

[w chest lat]
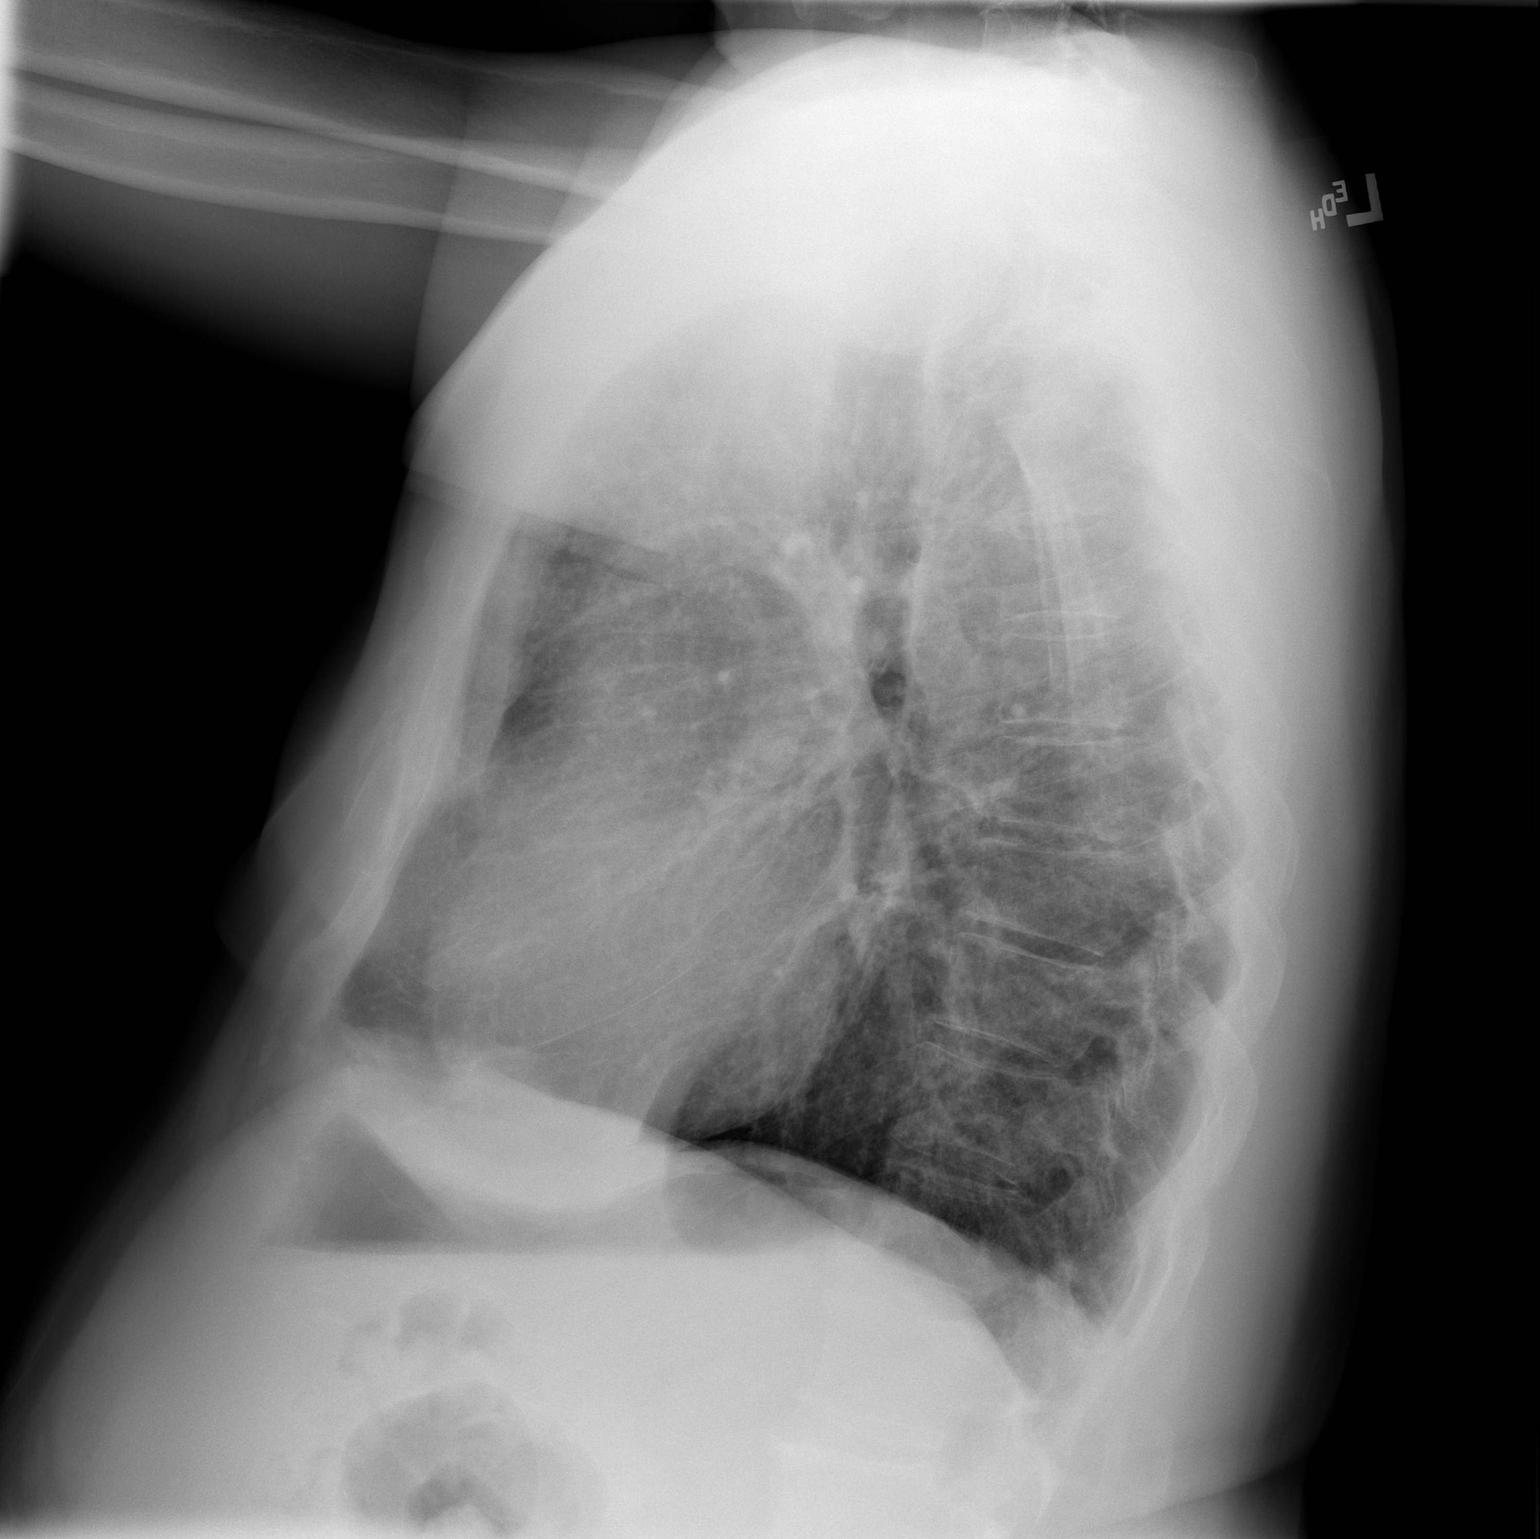

[2 of 2 positions shown; findings below may reference images not displayed]

FINDINGS: Cardiomediastinal silhouette unchanged in size and contour. No
evidence of central vascular congestion.

Mild reticular pattern of opacity of the bilateral mid and lower
lungs without confluent airspace disease. No pneumothorax or pleural
effusion.

Stigmata of emphysema, with increased retrosternal airspace,
flattened hemidiaphragms, increased AP diameter, and hyperinflation
on the AP view.
IMPRESSION: Changes of emphysema with a reticular pattern of opacity in the
bilateral lungs, which may represent early bronchopneumonia, or
chronic changes related to the underlying emphysema.

## 2020-07-18 IMAGING — CR DG CHEST 2V
2 series · 2 of 2 positions shown · non-contrast
Comparison: March 22, 2018

CLINICAL DATA: Recent coronary artery bypass grafting.
Hypertension.

EXAM:
CHEST - 2 VIEW

[chest pa]
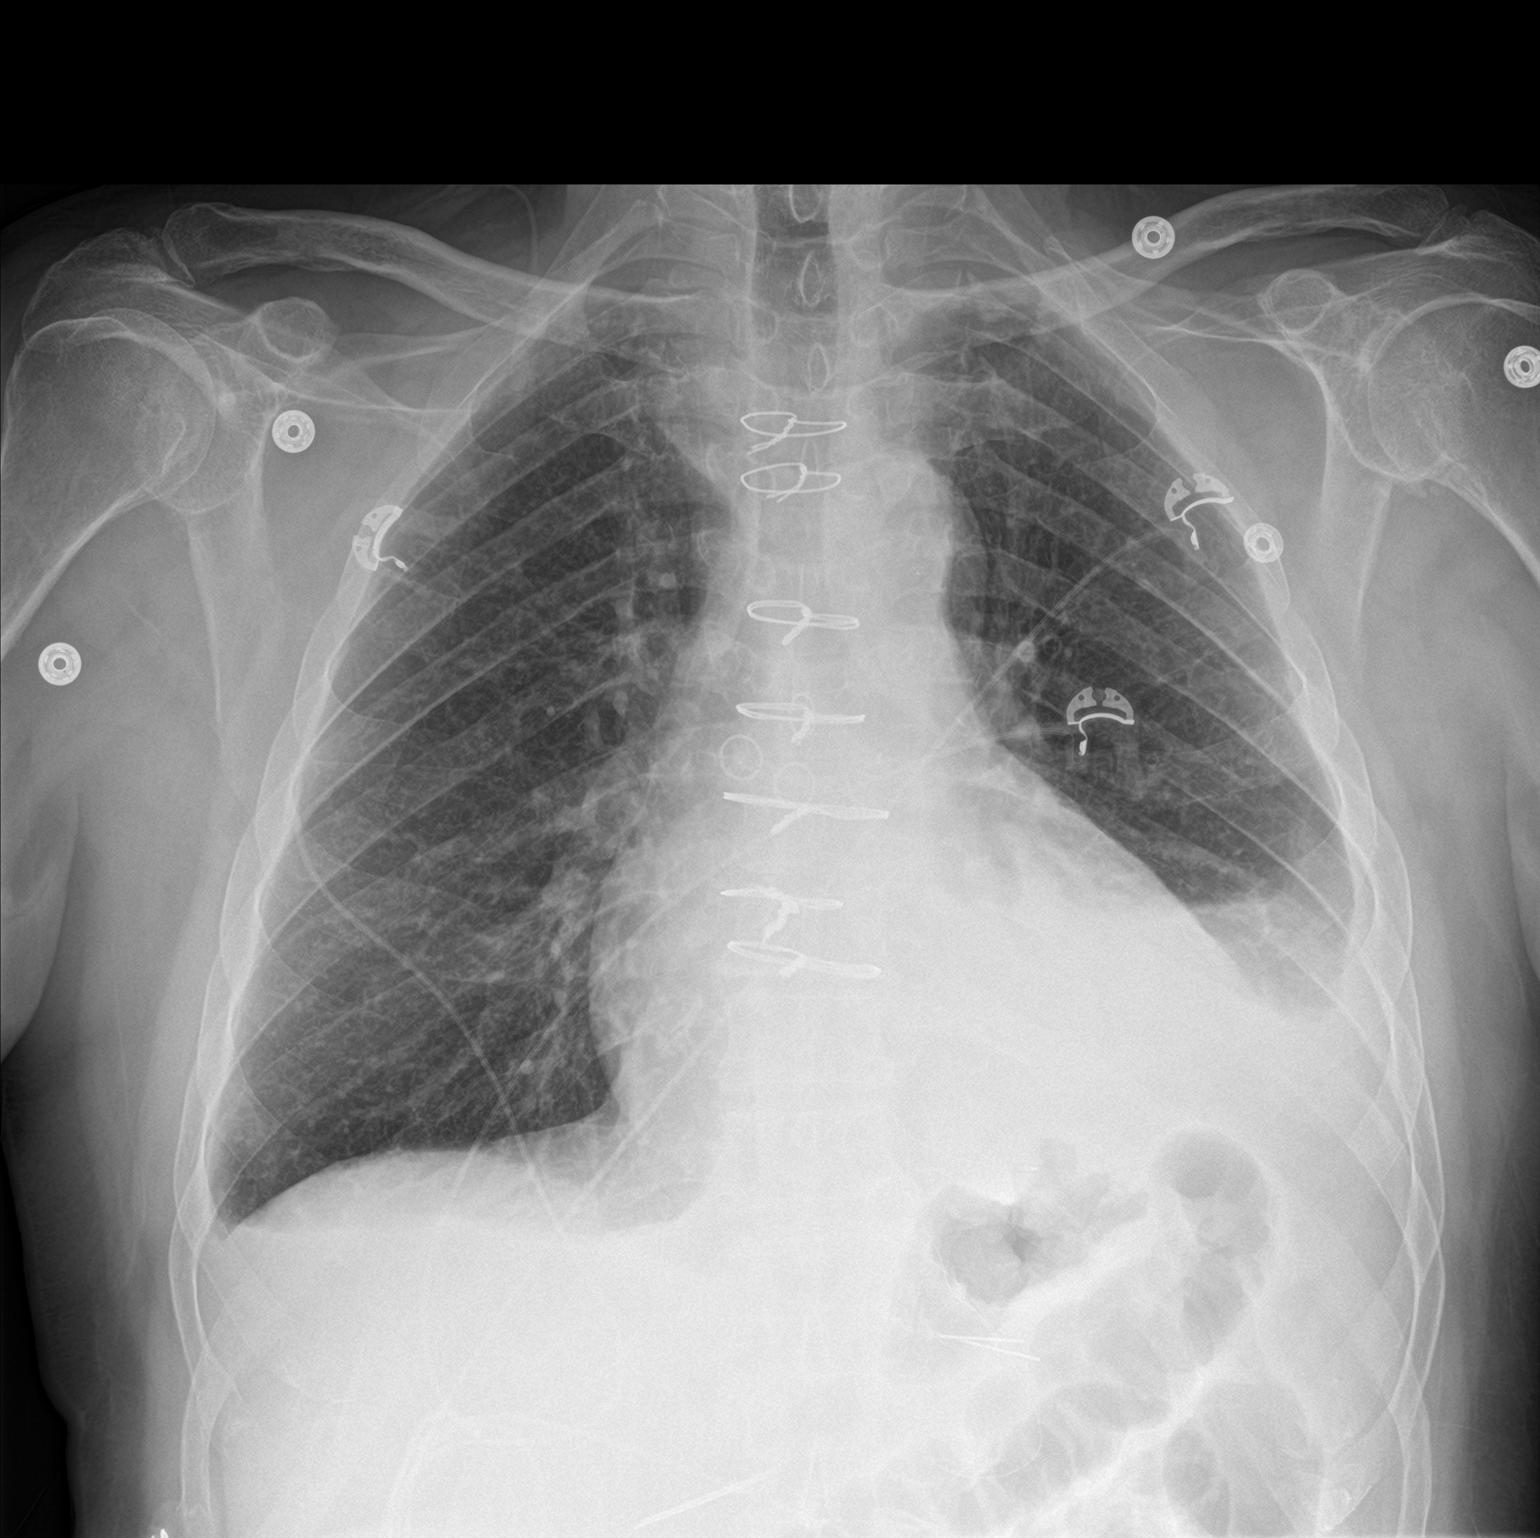

[chest lat]
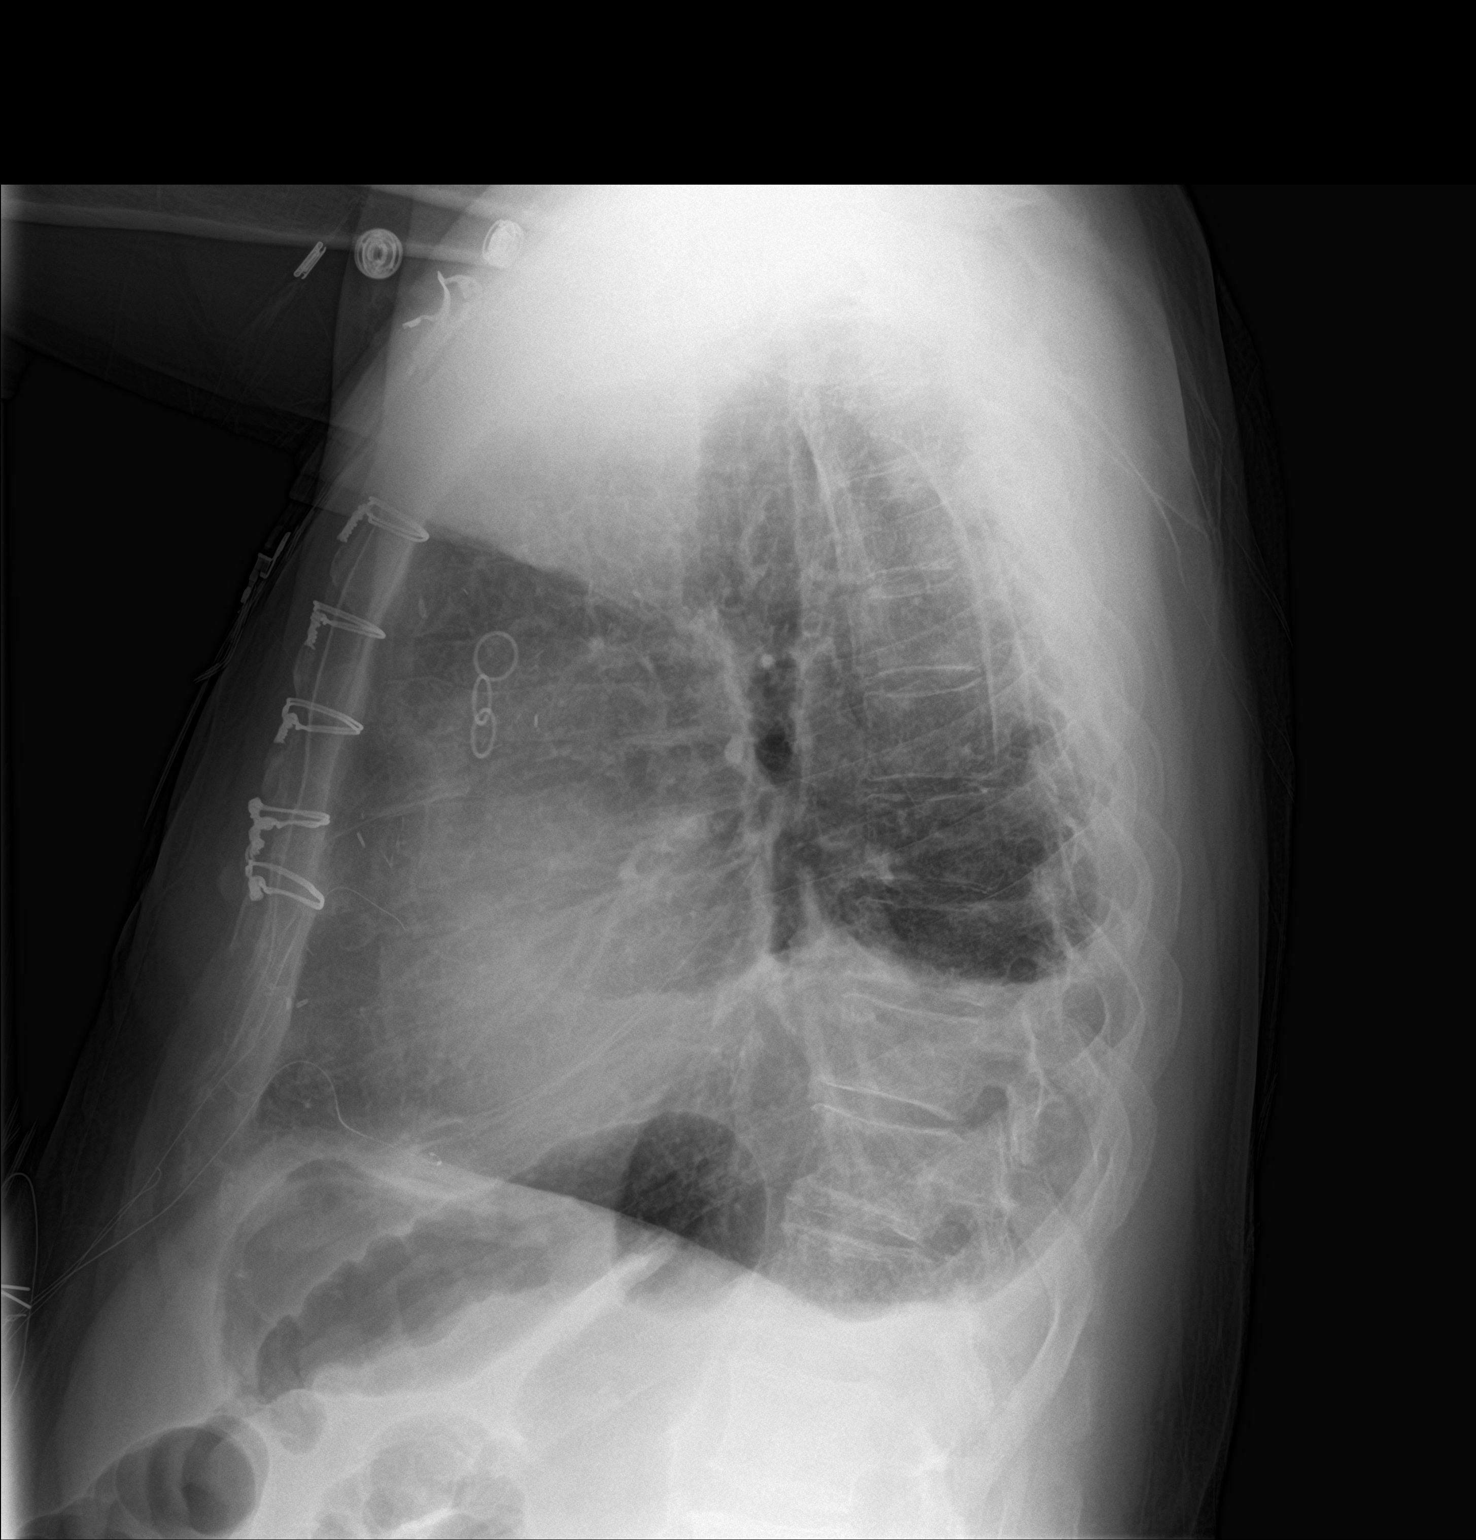

[2 of 2 positions shown; findings below may reference images not displayed]

FINDINGS: There is a left pleural effusion with atelectatic change in the left
base. Lungs elsewhere clear. Heart is borderline enlarged with
pulmonary vascularity normal. Patient is status post coronary artery
bypass grafting. No adenopathy. No bone lesions.
IMPRESSION: Persistent left pleural effusion with left base atelectasis. Lungs
elsewhere clear. Stable cardiac silhouette. No pneumothorax.

## 2020-08-05 ENCOUNTER — Ambulatory Visit (HOSPITAL_COMMUNITY)
Admission: RE | Admit: 2020-08-05 | Discharge: 2020-08-05 | Disposition: A | Discharge status: Home / Self Care | Payer: Medicare HMO | Source: Ambulatory Visit | Attending: Cardiology | Admitting: Cardiology

## 2020-08-05 ENCOUNTER — Encounter (HOSPITAL_COMMUNITY): Payer: Self-pay | Admitting: Cardiology

## 2020-08-05 ENCOUNTER — Other Ambulatory Visit: Payer: Self-pay

## 2020-08-05 VITALS — BP 128/70 | HR 76 | Wt 214.8 lb

## 2020-08-05 DIAGNOSIS — I2510 Atherosclerotic heart disease of native coronary artery without angina pectoris: Secondary | ICD-10-CM | POA: Diagnosis not present

## 2020-08-05 DIAGNOSIS — I5022 Chronic systolic (congestive) heart failure: Secondary | ICD-10-CM | POA: Insufficient documentation

## 2020-08-05 DIAGNOSIS — Z951 Presence of aortocoronary bypass graft: Secondary | ICD-10-CM | POA: Diagnosis not present

## 2020-08-05 DIAGNOSIS — I252 Old myocardial infarction: Secondary | ICD-10-CM | POA: Diagnosis not present

## 2020-08-05 DIAGNOSIS — Z79899 Other long term (current) drug therapy: Secondary | ICD-10-CM | POA: Insufficient documentation

## 2020-08-05 DIAGNOSIS — E785 Hyperlipidemia, unspecified: Secondary | ICD-10-CM | POA: Diagnosis not present

## 2020-08-05 DIAGNOSIS — Z87891 Personal history of nicotine dependence: Secondary | ICD-10-CM | POA: Diagnosis not present

## 2020-08-05 DIAGNOSIS — Z1383 Encounter for screening for respiratory disorder NEC: Secondary | ICD-10-CM

## 2020-08-05 DIAGNOSIS — I739 Peripheral vascular disease, unspecified: Secondary | ICD-10-CM | POA: Diagnosis not present

## 2020-08-05 DIAGNOSIS — R059 Cough, unspecified: Secondary | ICD-10-CM

## 2020-08-05 DIAGNOSIS — U071 COVID-19: Secondary | ICD-10-CM | POA: Diagnosis not present

## 2020-08-05 DIAGNOSIS — I255 Ischemic cardiomyopathy: Secondary | ICD-10-CM | POA: Diagnosis not present

## 2020-08-05 DIAGNOSIS — Z7982 Long term (current) use of aspirin: Secondary | ICD-10-CM | POA: Insufficient documentation

## 2020-08-05 DIAGNOSIS — J90 Pleural effusion, not elsewhere classified: Secondary | ICD-10-CM | POA: Diagnosis not present

## 2020-08-05 HISTORY — DX: Heart failure, unspecified: I50.9

## 2020-08-05 LAB — CBC
HCT: 39 % (ref 39.0–52.0)
Hemoglobin: 13 g/dL (ref 13.0–17.0)
MCH: 30.4 pg (ref 26.0–34.0)
MCHC: 33.3 g/dL (ref 30.0–36.0)
MCV: 91.3 fL (ref 80.0–100.0)
Platelets: 276 10*3/uL (ref 150–400)
RBC: 4.27 MIL/uL (ref 4.22–5.81)
RDW: 12.2 % (ref 11.5–15.5)
WBC: 14.9 10*3/uL — ABNORMAL HIGH (ref 4.0–10.5)
nRBC: 0 % (ref 0.0–0.2)

## 2020-08-05 LAB — BASIC METABOLIC PANEL
Anion gap: 10 (ref 5–15)
BUN: 14 mg/dL (ref 8–23)
CO2: 26 mmol/L (ref 22–32)
Calcium: 8.1 mg/dL — ABNORMAL LOW (ref 8.9–10.3)
Chloride: 101 mmol/L (ref 98–111)
Creatinine, Ser: 1.15 mg/dL (ref 0.61–1.24)
GFR, Estimated: >60 mL/min (ref >60)
Glucose, Bld: 87 mg/dL (ref 70–99)
Potassium: 4.2 mmol/L (ref 3.5–5.1)
Sodium: 137 mmol/L (ref 135–145)

## 2020-08-05 LAB — SARS CORONAVIRUS 2 BY RT PCR (HOSPITAL ORDER, PERFORMED IN ~~LOC~~ HOSPITAL LAB): SARS Coronavirus 2: POSITIVE — AB

## 2020-08-05 LAB — BRAIN NATRIURETIC PEPTIDE: B Natriuretic Peptide: 300.8 pg/mL — ABNORMAL HIGH (ref 0.0–100.0)

## 2020-08-05 NOTE — Progress Notes (Signed)
[facility]iology: Dr. Aundra Dubin PCP: Dr. Forde Dandy  80 y.o. with history of CAD and ischemic cardiomyopathy presents for followup of CHF. Patient had no cardiac history prior to 6/19. However, since around 12/18, he had noted exertional dyspnea.  In 6/19, [place] was [place] the mountains.  He developed very severe dyspnea and went to the hospital in [place] where he was found to have NSTEMI.  LHC was done, showing chronic occlusions of the LAD and RCA with collaterals.  Echo sh//owed EF 25-30%.  No intervention, he was eventually discharged to come home.  He has been seen by Dr. Prescott Gum for CABG evaluation.  Cardiac MRI in 6/19 showed EF 28% with substantial viability.  RHC was done in 7/19, showing mildly elevated filling pressures but low cardiac output, with CI 1.7.  After RHC, I increased his Lasix and added digoxin.    Of note, he has quit smoking since 6/19.   He was admitted for CABG in 9/19.  Volume overload post-op, treated with diuresis.    Echo in 12/19 showed EF 40%, mid anteroseptal and mid inferoseptal akinesis.   He had painful gynecomastia with spironolactone and switched to eplerenone but did not resolve.  Now off eplerenone with resolution of symptoms.   Echo in 3/21 showed EF 40-45%, mild LVH, normal RV.   ABIs in 3/21 showed moderate PAD, he saw Dr. Alvester Chou with plan for conservative management for now.   He has not been feeling good for 2-3 weeks.  He has had constant coughing with yellow sputum.  No fever.  He is short of breath when coughing but not short of breath walking around the house or into the office today.  No nausea/vomiting.  No lightheadedness.  His chest is sore with coughing, but no chest pain otherwise.  He has not lost his sense of smell or taste.  His wife also has a similar illness.  Weight is down 2 lbs.  No orthopnea/PND.    CXR was done today, appears to show a small left pleural effusion.  COVID-19 test was positive.   Labs (7/19): hgb 14.1, K 4.1,  creatinine 1.26, LDL 28 Labs (9/19): K 3.7, creatinine 1.09, LFTs normal, hgb 12.6 Labs (11/19): K 4.4, creatinine 1.31 Labs (12/19): K 4.4, creatinine 1.19 Labs (3/20): K 4.5, creatinine 1.12 Labs (5/20): LDL 41, HDL 27, TGs 176  Labs (8/20): K 4.9, creatinine 1.12 Labs (3/21): K 4.5, creatinine 1.14 Labs (6/21): K 4.2, creatinine 1.13, LDL 35, TGs 142 Labs (9/21): K 4.3, creatinine 1.12  PMH: 1. H/o retinal detachment 2. H/o transverse myelitis (remote) 3. CAD: NSTEMI 6/19 in [place]. - LHC (6/19): Chronic total occlusion of LAD and chronic total occlusion of RCA with collaterals.  - CABG (9/19): LIMA-LAD, SVG-D, SVG-ramus, SVG-PDA.  4. Chronic systolic CHF: Ischemic cardiomyopathy.   - Echo (6/19): LV moderately dilated, EF 25-30%, mild MR.  - Cardiac MRI (6/19): EF 28%, significant viability noted.  - RHC (7/19): mean RA 8, PA 40/16 mean 26, mean PCWP 23, CI 1.7, PVR 0.83 WU - Echo (12/19): EF 40%, mid inferoseptal and mid anteroseptal akinesis, inferior hypokinesis, normal RV size and systolic function.  - Painful gynecomastia with spironolactone and eplerenone. - Echo (3/21): EF 40-45%, mild LVH, normal RV  5. COPD: PFTs (9/19) with moderate obstruction.  6. PAD: ABIs (3/21) with 0.59 on right, 0.8 on left.   SH: Married, lives in [place].  He quit smoking in 6/19.    Family History  Problem  Relation Age of Onset  . Heart disease Mother   . Cancer Mother   . Heart attack Father    ROS: All systems reviewed and negative except as per HPI.   Current Outpatient Medications  Medication Sig Dispense Refill  . aspirin EC 325 MG EC tablet Take 1 tablet (325 mg total) by mouth daily. 30 tablet 0  . atorvastatin (LIPITOR) 40 MG tablet Take 1 tablet (40 mg total) by mouth at bedtime. 90 tablet 3  . carvedilol (COREG) 25 MG tablet TAKE 1 TABLET(25 MG) BY MOUTH TWICE DAILY 60 tablet 6  . ENTRESTO 97-103 MG TAKE 1 TABLET BY MOUTH TWICE DAILY 60 tablet 5  . Fexofenadine HCl  (MUCINEX ALLERGY PO) Take by mouth daily.    Marland Kitchen icosapent Ethyl (VASCEPA) 1 g capsule Take 2 capsules (2 g total) by mouth 2 (two) times daily. 360 capsule 3  . loratadine (CLARITIN) 10 MG tablet Take 10 mg by mouth daily.    Vladimir Faster Glycol-Propyl Glycol 0.4-0.3 % SOLN Place 1 drop into both eyes 2 (two) times daily.     No current facility-administered medications for this encounter.   BP 128/70   Pulse 76   Wt 97.4 kg (214 lb 12.8 oz)   SpO2 94%   BMI 30.38 kg/m  General: NAD Neck: No JVD, no thyromegaly or thyroid nodule.  Lungs: Clear to auscultation bilaterally with normal respiratory effort. CV: Nondisplaced PMI.  Heart regular S1/S2, no S3/S4, no murmur.  No peripheral edema.  No carotid bruit.  Difficult to palpate pedal pulses.  Abdomen: Soft, nontender, no hepatosplenomegaly, no distention.  Skin: Intact without lesions or rashes.  Neurologic: Alert and oriented x 3.  Psych: Normal affect. Extremities: No clubbing or cyanosis.  HEENT: Normal.   Assessment/Plan: 1. CAD: LHC in 6/19 with CTOs of LAD and RCA.  Cardiac MRI showed significant myocardial viability. He is s/p CABG x 4 in 9/19.  He has some pleuritic chest pain with coughing but no ischemic-type chest pain.   - Continue ASA 81 and statin.  2. Chronic systolic CHF: Ischemic cardiomyopathy.  Echo in 6/19 with EF 25-20%.  Cardiac MRI in 7/19 with EF 28%, significant viability noted. RHC in 7/19 showed mildly elevated filling pressure and low cardiac output. Now s/p CABG in 9/19.  Echo post-op in 12/19 showed EF up to 40%.  3/21 echo with EF up to 40-45%.  NYHA class II symptoms and not volume overloaded on exam.  Painful gynecomastia with spironolactone and eplerenone. Did tolerate Wilder Glade due to urinary urgency.  - Continue Entresto to 97/103 bid.   BMET today.  - Continue Coreg 25 mg bid.  - He does not want to retry Farxiga due to significant urinary symptoms.  - EF is out of range of ICD.  - Repeat echo at  followup in 3 months.  3. COPD: He has quit smoking since 6/19.  4. Hyperlipidemia: Continue atorvastatin and Vascepa, good lipids in 6/21.  5. PAD: Minimal claudication.  He saw Dr. Gwenlyn Found, plan for medical management for now.   - Repeat peripheral arterial dopplers in 3/22.  6. COVID-19 infection: He has been coughing for at least 2 weeks.  I sent him for COVID-19 test today, it was positive.  CXR showed small left effusion, no infiltrate noted. He is not short of breath, oxygen saturation was ok in the office today.  - Robitussin DM for cough.  - I asked him to followup with PCP for COVID-19 management.  Followup in 3 months with echo.    [name] 08/05/2020

## 2020-08-05 NOTE — Patient Instructions (Signed)
Labs done today. We will contact you only if your labs are abnormal.  No medication changes were made. Please continue all current medications as prescribed.  You may take Robitussin DM to help with your cough.  COVID-19 test done today.   Your physician recommends that you schedule a follow-up appointment in: 3 months with Dr. Aundra Dubin with an echo prior to your appointment.  Your physician has requested that you have an echocardiogram. Echocardiography is a painless test that uses sound waves to create images of your heart. It provides your doctor with information about the size and shape of your heart and how well your heart's chambers and valves are working. This procedure takes approximately one hour. There are no restrictions for this procedure.  Your physician has requested that you have a chest x-ray done.A chest x-ray takes a picture of the organs and structures inside the chest, including the heart, lungs, and blood vessels. This test can show several things, including, whether the heart is enlarges; whether fluid is building up in the lungs; and whether pacemaker / defibrillator leads are still in place.   Your physician has requested that you have an ankle brachial index (ABI). During this test an ultrasound and blood pressure cuff are used to evaluate the arteries that supply the arms and legs with blood. Allow thirty minutes for this exam. There are no restrictions or special instructions. This has to be approved through insurance prior to scheduling, once approved we will contact you to schedule an appointment.   If you have any questions or concerns before your next appointment please send [place] a message through [place] or call our office at [phone].    TO LEAVE A MESSAGE FOR THE NURSE SELECT OPTION 2, PLEASE LEAVE A MESSAGE INCLUDING: . YOUR NAME . DATE OF BIRTH . CALL BACK NUMBER . REASON FOR CALL**this is important as we prioritize the call backs  YOU WILL RECEIVE A CALL  BACK THE SAME DAY AS LONG AS YOU CALL BEFORE 4:00 PM   Do the following things EVERYDAY: 1) Weigh yourself in the morning before breakfast. Write it down and keep it in a log. 2) Take your medicines as prescribed 3) Eat low salt foods--Limit salt (sodium) to 2000 mg per day.  4) Stay as active as you can everyday 5) Limit all fluids for the day to less than 2 liters   At the [facility], you and your health needs are our priority. As part of our continuing mission to provide you with exceptional heart care, we have created designated Provider Care Teams. These Care Teams include your primary Cardiologist (physician) and Advanced Practice Providers (APPs- Physician Assistants and Nurse Practitioners) who all work together to provide you with the care you need, when you need it.   You may see any of the following providers on your designated Care Team at your next follow up: Marland Kitchen Dr Glori Bickers . Dr [name] . Darrick Grinder, NP . Lyda Jester, PA . Audry Riles, PharmD   Please be sure to bring in all your medications bottles to every appointment.

## 2020-08-06 ENCOUNTER — Telehealth: Payer: Self-pay | Admitting: Physician Assistant

## 2020-08-06 NOTE — Telephone Encounter (Signed)
Called to discuss with patient about Covid symptoms and the use of a monoclonal antibody infusion for those with mild to moderate Covid symptoms and at a high risk of hospitalization.   Pt is not qualified for the monoclonal antibody infusion or antiviral therapy as his symptoms started > 7 days ago.Symptoms tier reviewed as well as criteria for ending isolation. Preventative practices reviewed. Patient verbalized understanding.  [place], [place]  08/06/2020 11:59 AM

## 2020-08-10 ENCOUNTER — Other Ambulatory Visit (HOSPITAL_COMMUNITY): Payer: Self-pay | Admitting: Cardiology

## 2020-09-08 ENCOUNTER — Other Ambulatory Visit (HOSPITAL_COMMUNITY): Payer: Self-pay | Admitting: Cardiology

## 2020-09-26 DIAGNOSIS — I10 Essential (primary) hypertension: Secondary | ICD-10-CM | POA: Diagnosis not present

## 2020-09-26 DIAGNOSIS — N1831 Chronic kidney disease, stage 3a: Secondary | ICD-10-CM | POA: Diagnosis not present

## 2020-09-26 DIAGNOSIS — I25810 Atherosclerosis of coronary artery bypass graft(s) without angina pectoris: Secondary | ICD-10-CM | POA: Diagnosis not present

## 2020-09-26 DIAGNOSIS — E1151 Type 2 diabetes mellitus with diabetic peripheral angiopathy without gangrene: Secondary | ICD-10-CM | POA: Diagnosis not present

## 2020-09-26 DIAGNOSIS — I739 Peripheral vascular disease, unspecified: Secondary | ICD-10-CM | POA: Diagnosis not present

## 2020-09-26 DIAGNOSIS — I5022 Chronic systolic (congestive) heart failure: Secondary | ICD-10-CM | POA: Diagnosis not present

## 2020-09-26 DIAGNOSIS — J449 Chronic obstructive pulmonary disease, unspecified: Secondary | ICD-10-CM | POA: Diagnosis not present

## 2020-09-26 DIAGNOSIS — R972 Elevated prostate specific antigen [PSA]: Secondary | ICD-10-CM | POA: Diagnosis not present

## 2020-09-26 DIAGNOSIS — E785 Hyperlipidemia, unspecified: Secondary | ICD-10-CM | POA: Diagnosis not present

## 2020-10-07 DIAGNOSIS — J984 Other disorders of lung: Secondary | ICD-10-CM | POA: Diagnosis not present

## 2020-10-07 DIAGNOSIS — J449 Chronic obstructive pulmonary disease, unspecified: Secondary | ICD-10-CM | POA: Diagnosis not present

## 2020-10-07 DIAGNOSIS — M62838 Other muscle spasm: Secondary | ICD-10-CM | POA: Diagnosis not present

## 2020-10-07 DIAGNOSIS — N1831 Chronic kidney disease, stage 3a: Secondary | ICD-10-CM | POA: Diagnosis not present

## 2020-10-07 DIAGNOSIS — I25810 Atherosclerosis of coronary artery bypass graft(s) without angina pectoris: Secondary | ICD-10-CM | POA: Diagnosis not present

## 2020-10-10 ENCOUNTER — Other Ambulatory Visit: Payer: Self-pay

## 2020-10-10 ENCOUNTER — Ambulatory Visit
Admission: RE | Admit: 2020-10-10 | Discharge: 2020-10-10 | Disposition: A | Discharge status: Home / Self Care | Payer: Medicare HMO | Source: Ambulatory Visit | Attending: Endocrinology | Admitting: Endocrinology

## 2020-10-10 ENCOUNTER — Other Ambulatory Visit: Payer: Self-pay | Admitting: Endocrinology

## 2020-10-10 DIAGNOSIS — R053 Chronic cough: Secondary | ICD-10-CM

## 2020-10-10 DIAGNOSIS — I2510 Atherosclerotic heart disease of native coronary artery without angina pectoris: Secondary | ICD-10-CM | POA: Diagnosis not present

## 2020-10-10 DIAGNOSIS — D1779 Benign lipomatous neoplasm of other sites: Secondary | ICD-10-CM | POA: Diagnosis not present

## 2020-10-10 DIAGNOSIS — J929 Pleural plaque without asbestos: Secondary | ICD-10-CM | POA: Diagnosis not present

## 2020-10-10 DIAGNOSIS — J984 Other disorders of lung: Secondary | ICD-10-CM

## 2020-10-10 DIAGNOSIS — D171 Benign lipomatous neoplasm of skin and subcutaneous tissue of trunk: Secondary | ICD-10-CM | POA: Diagnosis not present

## 2020-10-10 MED ORDER — IOPAMIDOL (ISOVUE-300) INJECTION 61%
75.0000 mL | Freq: Once | INTRAVENOUS | Status: AC | PRN
  Administered 2020-10-10: 50 mL via INTRAVENOUS

## 2020-10-16 ENCOUNTER — Telehealth (HOSPITAL_COMMUNITY): Payer: Self-pay | Admitting: *Deleted

## 2020-10-16 NOTE — Telephone Encounter (Signed)
He can increase Zanaflex if needed.

## 2020-10-16 NOTE — Telephone Encounter (Addendum)
Pt left VM stating he was having side pain and chest pain.Pt said he noticed muscles spasms on 10/07/20. Spasms continues on 10/08/20 he called Dr.Souths office to get an appt but saw an APP instead. Dr.Souths office prescribed a muscle relaxer,prednisone, and voltaren gel. Pt said that it helped but was told to contact our office if pain came back. Pt said he doesn't believe it is a cardiac issue but he thinks its muscle spasms because he can see the muscle jump. Dr.Souths office told pt he couldn't increase Zanaflex without ok from [place]. pt currently taking zanaflex 4mg   Tid prn. Pt said when the spasms start the zanaflex does not work and today is his last dose.   Routed to [place] for advice

## 2020-10-17 MED ORDER — TIZANIDINE HCL 4 MG PO CAPS: 4.0000 mg | ORAL_CAPSULE | Freq: Three times a day (TID) | ORAL | 0 refills | Status: DC | PRN

## 2020-10-17 NOTE — Telephone Encounter (Signed)
Refill sent into pharmacy so patient can have some over the weekend since pcp office is closed today, advised pharmacy that refills need to come from pcp

## 2020-10-17 NOTE — Addendum Note (Signed)
Addended byShela Nevin R on: 9/81/0254 03:06 PM   Modules accepted: Orders

## 2020-11-07 ENCOUNTER — Other Ambulatory Visit (HOSPITAL_COMMUNITY): Payer: Self-pay | Admitting: *Deleted

## 2020-11-07 DIAGNOSIS — I5022 Chronic systolic (congestive) heart failure: Secondary | ICD-10-CM

## 2020-11-10 ENCOUNTER — Other Ambulatory Visit: Payer: Self-pay

## 2020-11-10 ENCOUNTER — Ambulatory Visit (HOSPITAL_COMMUNITY)
Admission: RE | Admit: 2020-11-10 | Discharge: 2020-11-10 | Disposition: A | Discharge status: Home / Self Care | Payer: Medicare HMO | Source: Ambulatory Visit | Attending: Cardiology | Admitting: Cardiology

## 2020-11-10 ENCOUNTER — Ambulatory Visit (HOSPITAL_BASED_OUTPATIENT_CLINIC_OR_DEPARTMENT_OTHER)
Admission: RE | Admit: 2020-11-10 | Discharge: 2020-11-10 | Disposition: A | Discharge status: Home / Self Care | Payer: Medicare HMO | Source: Ambulatory Visit | Attending: Cardiology | Admitting: Cardiology

## 2020-11-10 ENCOUNTER — Encounter (HOSPITAL_COMMUNITY): Payer: Medicare HMO | Admitting: Cardiology

## 2020-11-10 DIAGNOSIS — I110 Hypertensive heart disease with heart failure: Secondary | ICD-10-CM | POA: Insufficient documentation

## 2020-11-10 DIAGNOSIS — Z87891 Personal history of nicotine dependence: Secondary | ICD-10-CM | POA: Insufficient documentation

## 2020-11-10 DIAGNOSIS — I509 Heart failure, unspecified: Secondary | ICD-10-CM | POA: Diagnosis not present

## 2020-11-10 DIAGNOSIS — I5022 Chronic systolic (congestive) heart failure: Secondary | ICD-10-CM

## 2020-11-10 DIAGNOSIS — Z8616 Personal history of COVID-19: Secondary | ICD-10-CM | POA: Diagnosis not present

## 2020-11-10 DIAGNOSIS — Z951 Presence of aortocoronary bypass graft: Secondary | ICD-10-CM | POA: Insufficient documentation

## 2020-11-10 DIAGNOSIS — I2510 Atherosclerotic heart disease of native coronary artery without angina pectoris: Secondary | ICD-10-CM | POA: Insufficient documentation

## 2020-11-10 DIAGNOSIS — I252 Old myocardial infarction: Secondary | ICD-10-CM | POA: Diagnosis not present

## 2020-11-10 DIAGNOSIS — I739 Peripheral vascular disease, unspecified: Secondary | ICD-10-CM | POA: Insufficient documentation

## 2020-11-10 LAB — ECHOCARDIOGRAM COMPLETE
Area-P 1/2: 1.74 cm2
Calc EF: 46.2 %
S' Lateral: 3.6 cm
Single Plane A2C EF: 48.2 %
Single Plane A4C EF: 47.2 %

## 2020-11-10 NOTE — Progress Notes (Signed)
VASCULAR LAB    ABIs have been performed.  See CV proc for preliminary results.   Tamarra Geiselman, RVT 11/10/2020, 12:26 PM

## 2020-11-10 NOTE — Progress Notes (Incomplete)
  Echocardiogram 2D Echocardiogram has been performed.  Robert [name] 11/10/2020, 1:45 PM

## 2020-11-13 ENCOUNTER — Encounter (HOSPITAL_COMMUNITY): Payer: Self-pay | Admitting: Cardiology

## 2020-11-13 ENCOUNTER — Ambulatory Visit (HOSPITAL_COMMUNITY)
Admission: RE | Admit: 2020-11-13 | Discharge: 2020-11-13 | Disposition: A | Discharge status: Home / Self Care | Payer: Medicare HMO | Source: Ambulatory Visit | Attending: Cardiology | Admitting: Cardiology

## 2020-11-13 ENCOUNTER — Other Ambulatory Visit: Payer: Self-pay

## 2020-11-13 VITALS — BP 154/82 | HR 70 | Wt 205.4 lb

## 2020-11-13 DIAGNOSIS — M545 Low back pain, unspecified: Secondary | ICD-10-CM | POA: Insufficient documentation

## 2020-11-13 DIAGNOSIS — Z951 Presence of aortocoronary bypass graft: Secondary | ICD-10-CM | POA: Insufficient documentation

## 2020-11-13 DIAGNOSIS — I739 Peripheral vascular disease, unspecified: Secondary | ICD-10-CM

## 2020-11-13 DIAGNOSIS — R252 Cramp and spasm: Secondary | ICD-10-CM | POA: Insufficient documentation

## 2020-11-13 DIAGNOSIS — I252 Old myocardial infarction: Secondary | ICD-10-CM | POA: Diagnosis not present

## 2020-11-13 DIAGNOSIS — E785 Hyperlipidemia, unspecified: Secondary | ICD-10-CM | POA: Diagnosis not present

## 2020-11-13 DIAGNOSIS — I255 Ischemic cardiomyopathy: Secondary | ICD-10-CM | POA: Insufficient documentation

## 2020-11-13 DIAGNOSIS — Z87891 Personal history of nicotine dependence: Secondary | ICD-10-CM | POA: Insufficient documentation

## 2020-11-13 DIAGNOSIS — I5022 Chronic systolic (congestive) heart failure: Secondary | ICD-10-CM | POA: Diagnosis not present

## 2020-11-13 DIAGNOSIS — J449 Chronic obstructive pulmonary disease, unspecified: Secondary | ICD-10-CM | POA: Diagnosis not present

## 2020-11-13 DIAGNOSIS — Z7901 Long term (current) use of anticoagulants: Secondary | ICD-10-CM | POA: Diagnosis not present

## 2020-11-13 DIAGNOSIS — Z8249 Family history of ischemic heart disease and other diseases of the circulatory system: Secondary | ICD-10-CM | POA: Diagnosis not present

## 2020-11-13 DIAGNOSIS — I2510 Atherosclerotic heart disease of native coronary artery without angina pectoris: Secondary | ICD-10-CM | POA: Insufficient documentation

## 2020-11-13 DIAGNOSIS — Z7982 Long term (current) use of aspirin: Secondary | ICD-10-CM | POA: Diagnosis not present

## 2020-11-13 DIAGNOSIS — Z79899 Other long term (current) drug therapy: Secondary | ICD-10-CM | POA: Diagnosis not present

## 2020-11-13 LAB — BASIC METABOLIC PANEL
Anion gap: 8 (ref 5–15)
BUN: 17 mg/dL (ref 8–23)
CO2: 26 mmol/L (ref 22–32)
Calcium: 9 mg/dL (ref 8.9–10.3)
Chloride: 102 mmol/L (ref 98–111)
Creatinine, Ser: 0.99 mg/dL (ref 0.61–1.24)
GFR, Estimated: >60 mL/min (ref >60)
Glucose, Bld: 88 mg/dL (ref 70–99)
Potassium: 4.4 mmol/L (ref 3.5–5.1)
Sodium: 136 mmol/L (ref 135–145)

## 2020-11-13 LAB — LIPID PANEL
Cholesterol: 66 mg/dL (ref 0–200)
HDL: 25 mg/dL — ABNORMAL LOW (ref >40)
LDL Cholesterol: 26 mg/dL (ref 0–99)
Total CHOL/HDL Ratio: 2.6 RATIO
Triglycerides: 75 mg/dL (ref <150)
VLDL: 15 mg/dL (ref 0–40)

## 2020-11-13 NOTE — Progress Notes (Signed)
[facility] Cardiology: Dr. Aundra Dubin PCP: Dr. Forde Dandy  80 y.o. with history of CAD and ischemic cardiomyopathy presents for followup of CHF. Patient had no cardiac history prior to 6/19. However, since around 12/18, he had noted exertional dyspnea.  In 6/19, [place]  He developed very severe dyspnea and went to the hospital in [place] where he was found to have NSTEMI.  LHC was done, showing chronic occlusions of the LAD and RCA with collaterals.  Echo sh//owed EF 25-30%.  No intervention, he was eventually discharged to come home.  He has been seen by Dr. Prescott Gum for CABG evaluation.  Cardiac MRI in 6/19 showed EF 28% with substantial viability.  RHC was done in 7/19, showing mildly elevated filling pressures but low cardiac output, with CI 1.7.  After RHC, I increased his Lasix and added digoxin.    Of note, he has quit smoking since 6/19.   He was admitted for CABG in 9/19.  Volume overload post-op, treated with diuresis.    Echo in 12/19 showed EF 40%, mid anteroseptal and mid inferoseptal akinesis.   He had painful gynecomastia with spironolactone and switched to eplerenone but did not resolve.  Now off eplerenone with resolution of symptoms.   Echo in 3/21 showed EF 40-45%, mild LVH, normal RV.   ABIs in 3/21 showed moderate PAD, he saw Dr. Alvester Chou with plan for conservative management for now.   He had COVID-19 in 2/22.   Echo in 5/22 showed EF up to 45-50%, noraml RV.  ABIs in 5/22 with unchanged mild disease on right and moderate on left.   He returns for followup of CHF.  Main complaint is low back pain/spasm. This has been limiting his activity recently.  No significant claudication.  Weight is down 9 lbs.  No chest pain.  No significant exertional dyspnea though he has been less active.  No orthopnea/PND.    ECG (personally reviewed): NSR, PVCs, lateral TWIs  Labs (7/19): hgb 14.1, K 4.1, creatinine 1.26, LDL 28 Labs (9/19): K 3.7, creatinine 1.09, LFTs  normal, hgb 12.6 Labs (11/19): K 4.4, creatinine 1.31 Labs (12/19): K 4.4, creatinine 1.19 Labs (3/20): K 4.5, creatinine 1.12 Labs (5/20): LDL 41, HDL 27, TGs 176  Labs (8/20): K 4.9, creatinine 1.12 Labs (3/21): K 4.5, creatinine 1.14 Labs (6/21): K 4.2, creatinine 1.13, LDL 35, TGs 142 Labs (9/21): K 4.3, creatinine 1.12 Labs (2/22): BNP 301, K 4.2, creatinine 1.15  PMH: 1. H/o retinal detachment 2. H/o transverse myelitis (remote) 3. CAD: NSTEMI 6/19 in [place]. - LHC (6/19): Chronic total occlusion of LAD and chronic total occlusion of RCA with collaterals.  - CABG (9/19): LIMA-LAD, SVG-D, SVG-ramus, SVG-PDA.  4. Chronic systolic CHF: Ischemic cardiomyopathy.   - Echo (6/19): LV moderately dilated, EF 25-30%, mild MR.  - Cardiac MRI (6/19): EF 28%, significant viability noted.  - RHC (7/19): mean RA 8, PA 40/16 mean 26, mean PCWP 23, CI 1.7, PVR 0.83 WU - Echo (12/19): EF 40%, mid inferoseptal and mid anteroseptal akinesis, inferior hypokinesis, normal RV size and systolic function.  - Painful gynecomastia with spironolactone and eplerenone. - Echo (3/21): EF 40-45%, mild LVH, normal RV  - Echo (5/22): EF 45-50%, normal RV.  5. COPD: PFTs (9/19) with moderate obstruction.  6. PAD: ABIs (3/21) with 0.59 on right, 0.8 on left.  - ABIs (5/22): stable mild disease on right and moderate on left.   SH: Married, lives in [place].  He quit  smoking in 6/19.    Family History  Problem Relation Age of Onset  . Heart disease Mother   . Cancer Mother   . Heart attack Father    ROS: All systems reviewed and negative except as per HPI.   Current Outpatient Medications  Medication Sig Dispense Refill  . aspirin EC 325 MG EC tablet Take 1 tablet (325 mg total) by mouth daily. 30 tablet 0  . atorvastatin (LIPITOR) 40 MG tablet Take 1 tablet (40 mg total) by mouth at bedtime. 90 tablet 3  . carvedilol (COREG) 25 MG tablet TAKE 1 TABLET(25 MG) BY MOUTH TWICE DAILY 60 tablet 6  .  ENTRESTO 97-103 MG TAKE 1 TABLET BY MOUTH TWICE DAILY 60 tablet 5  . Fexofenadine HCl (MUCINEX ALLERGY PO) Take by mouth daily.    Marland Kitchen icosapent Ethyl (VASCEPA) 1 g capsule Take 2 capsules (2 g total) by mouth 2 (two) times daily. 360 capsule 3  . loratadine (CLARITIN) 10 MG tablet Take 10 mg by mouth daily.    Vladimir Faster Glycol-Propyl Glycol 0.4-0.3 % SOLN Place 1 drop into both eyes 2 (two) times daily.    Marland Kitchen tiZANidine (ZANAFLEX) 4 MG capsule Take 1 capsule (4 mg total) by mouth 3 (three) times daily as needed for muscle spasms. 30 capsule 0   No current facility-administered medications for this encounter.   BP (!) 154/82   Pulse 70   Wt 93.2 kg (205 lb 6 oz)   SpO2 95%   BMI 29.05 kg/m  General: NAD Neck: No JVD, no thyromegaly or thyroid nodule.  Lungs: Clear to auscultation bilaterally with normal respiratory effort. CV: Nondisplaced PMI.  Heart regular S1/S2, no S3/S4, no murmur.  No peripheral edema.  No carotid bruit.  Unable to palpate pedal pulses.  Abdomen: Soft, nontender, no hepatosplenomegaly, no distention.  Skin: Intact without lesions or rashes.  Neurologic: Alert and oriented x 3.  Psych: Normal affect. Extremities: No clubbing or cyanosis.  HEENT: Normal.   Assessment/Plan: 1. CAD: LHC in 6/19 with CTOs of LAD and RCA.  Cardiac MRI showed significant myocardial viability. He is s/p CABG x 4 in 9/19.  No chest pain. - Continue ASA 81 and statin.  2. Chronic systolic CHF: Ischemic cardiomyopathy.  Echo in 6/19 with EF 25-20%.  Cardiac MRI in 7/19 with EF 28%, significant viability noted. RHC in 7/19 showed mildly elevated filling pressure and low cardiac output. Now s/p CABG in 9/19.  Echo post-op in 12/19 showed EF up to 40%.  3/21 echo with EF up to 40-45%, and echo in 5/22 with EF 45-50%.  NYHA class II symptoms and not volume overloaded on exam.  Painful gynecomastia with spironolactone and eplerenone. Did tolerate Wilder Glade due to urinary urgency.  - Continue  Entresto to 97/103 bid.   BMET today.  - Continue Coreg 25 mg bid.  - He does not want to retry Farxiga due to significant urinary symptoms.  - EF is out of range of ICD.  3. COPD: He has quit smoking since 6/19.  4. Hyperlipidemia: Continue atorvastatin and Vascepa, check lipids today.  5. PAD: Minimal claudication.  He saw Dr. Gwenlyn Found, plan for medical management for now.    Followup in 4 months  [name] 11/13/2020

## 2020-11-13 NOTE — Patient Instructions (Addendum)
EKG done today.  Labs done today. We will contact you only if your labs are abnormal.  No medication changes were made. Please continue all current medications as prescribed.  Your physician recommends that you schedule a follow-up appointment in: 4 months  If you have any questions or concerns before your next appointment please send us a message through mychart or call our office at [phone].    TO LEAVE A MESSAGE FOR THE NURSE SELECT OPTION 2, PLEASE LEAVE A MESSAGE INCLUDING: . YOUR NAME . DATE OF BIRTH . CALL BACK NUMBER . REASON FOR CALL**this is important as we prioritize the call backs  YOU WILL RECEIVE A CALL BACK THE SAME DAY AS LONG AS YOU CALL BEFORE 4:00 PM   Do the following things EVERYDAY: 1) Weigh yourself in the morning before breakfast. Write it down and keep it in a log. 2) Take your medicines as prescribed 3) Eat low salt foods--Limit salt (sodium) to 2000 mg per day.  4) Stay as active as you can everyday 5) Limit all fluids for the day to less than 2 liters   At the [facility], you and your health needs are our priority. As part of our continuing mission to provide you with exceptional heart care, we have created designated Provider Care Teams. These Care Teams include your primary Cardiologist (physician) and Advanced Practice Providers (APPs- Physician Assistants and Nurse Practitioners) who all work together to provide you with the care you need, when you need it.   You may see any of the following providers on your designated Care Team at your next follow up: . Dr Daniel Bensimhon . Dr Dalton McLean . Amy Clegg, NP . Brittainy Simmons, PA . Lauren Kemp, PharmD   Please be sure to bring in all your medications bottles to every appointment.   

## 2020-11-20 DIAGNOSIS — N401 Enlarged prostate with lower urinary tract symptoms: Secondary | ICD-10-CM | POA: Diagnosis not present

## 2020-11-20 DIAGNOSIS — R350 Frequency of micturition: Secondary | ICD-10-CM | POA: Diagnosis not present

## 2020-11-20 DIAGNOSIS — R972 Elevated prostate specific antigen [PSA]: Secondary | ICD-10-CM | POA: Diagnosis not present

## 2020-11-20 DIAGNOSIS — R3912 Poor urinary stream: Secondary | ICD-10-CM | POA: Diagnosis not present

## 2020-11-24 ENCOUNTER — Other Ambulatory Visit: Payer: Self-pay | Admitting: Urology

## 2020-11-24 DIAGNOSIS — R972 Elevated prostate specific antigen [PSA]: Secondary | ICD-10-CM

## 2020-12-15 ENCOUNTER — Other Ambulatory Visit: Payer: Self-pay

## 2020-12-15 ENCOUNTER — Ambulatory Visit
Admission: RE | Admit: 2020-12-15 | Discharge: 2020-12-15 | Disposition: A | Discharge status: Home / Self Care | Payer: Medicare HMO | Source: Ambulatory Visit | Attending: Urology | Admitting: Urology

## 2020-12-15 DIAGNOSIS — R972 Elevated prostate specific antigen [PSA]: Secondary | ICD-10-CM | POA: Diagnosis not present

## 2020-12-15 DIAGNOSIS — N401 Enlarged prostate with lower urinary tract symptoms: Secondary | ICD-10-CM | POA: Diagnosis not present

## 2020-12-15 MED ORDER — GADOBENATE DIMEGLUMINE 529 MG/ML IV SOLN
20.0000 mL | Freq: Once | INTRAVENOUS | Status: AC | PRN
  Administered 2020-12-15: 20 mL via INTRAVENOUS

## 2020-12-16 DIAGNOSIS — M545 Low back pain, unspecified: Secondary | ICD-10-CM | POA: Diagnosis not present

## 2020-12-24 DIAGNOSIS — M6281 Muscle weakness (generalized): Secondary | ICD-10-CM | POA: Diagnosis not present

## 2020-12-24 DIAGNOSIS — M62830 Muscle spasm of back: Secondary | ICD-10-CM | POA: Diagnosis not present

## 2020-12-24 DIAGNOSIS — R262 Difficulty in walking, not elsewhere classified: Secondary | ICD-10-CM | POA: Diagnosis not present

## 2020-12-24 DIAGNOSIS — M545 Low back pain, unspecified: Secondary | ICD-10-CM | POA: Diagnosis not present

## 2020-12-29 DIAGNOSIS — M6281 Muscle weakness (generalized): Secondary | ICD-10-CM | POA: Diagnosis not present

## 2020-12-29 DIAGNOSIS — M545 Low back pain, unspecified: Secondary | ICD-10-CM | POA: Diagnosis not present

## 2020-12-29 DIAGNOSIS — M62830 Muscle spasm of back: Secondary | ICD-10-CM | POA: Diagnosis not present

## 2020-12-29 DIAGNOSIS — R262 Difficulty in walking, not elsewhere classified: Secondary | ICD-10-CM | POA: Diagnosis not present

## 2020-12-30 DIAGNOSIS — N401 Enlarged prostate with lower urinary tract symptoms: Secondary | ICD-10-CM | POA: Diagnosis not present

## 2020-12-30 DIAGNOSIS — R3912 Poor urinary stream: Secondary | ICD-10-CM | POA: Diagnosis not present

## 2020-12-30 DIAGNOSIS — R972 Elevated prostate specific antigen [PSA]: Secondary | ICD-10-CM | POA: Diagnosis not present

## 2020-12-30 DIAGNOSIS — R350 Frequency of micturition: Secondary | ICD-10-CM | POA: Diagnosis not present

## 2020-12-31 DIAGNOSIS — M6281 Muscle weakness (generalized): Secondary | ICD-10-CM | POA: Diagnosis not present

## 2020-12-31 DIAGNOSIS — M545 Low back pain, unspecified: Secondary | ICD-10-CM | POA: Diagnosis not present

## 2020-12-31 DIAGNOSIS — M62830 Muscle spasm of back: Secondary | ICD-10-CM | POA: Diagnosis not present

## 2020-12-31 DIAGNOSIS — R262 Difficulty in walking, not elsewhere classified: Secondary | ICD-10-CM | POA: Diagnosis not present

## 2021-01-06 DIAGNOSIS — R262 Difficulty in walking, not elsewhere classified: Secondary | ICD-10-CM | POA: Diagnosis not present

## 2021-01-06 DIAGNOSIS — M545 Low back pain, unspecified: Secondary | ICD-10-CM | POA: Diagnosis not present

## 2021-01-06 DIAGNOSIS — M6281 Muscle weakness (generalized): Secondary | ICD-10-CM | POA: Diagnosis not present

## 2021-01-06 DIAGNOSIS — M62830 Muscle spasm of back: Secondary | ICD-10-CM | POA: Diagnosis not present

## 2021-01-08 DIAGNOSIS — M545 Low back pain, unspecified: Secondary | ICD-10-CM | POA: Diagnosis not present

## 2021-01-08 DIAGNOSIS — M62830 Muscle spasm of back: Secondary | ICD-10-CM | POA: Diagnosis not present

## 2021-01-08 DIAGNOSIS — R262 Difficulty in walking, not elsewhere classified: Secondary | ICD-10-CM | POA: Diagnosis not present

## 2021-01-08 DIAGNOSIS — M6281 Muscle weakness (generalized): Secondary | ICD-10-CM | POA: Diagnosis not present

## 2021-01-13 DIAGNOSIS — R262 Difficulty in walking, not elsewhere classified: Secondary | ICD-10-CM | POA: Diagnosis not present

## 2021-01-13 DIAGNOSIS — M62830 Muscle spasm of back: Secondary | ICD-10-CM | POA: Diagnosis not present

## 2021-01-13 DIAGNOSIS — M6281 Muscle weakness (generalized): Secondary | ICD-10-CM | POA: Diagnosis not present

## 2021-01-13 DIAGNOSIS — M545 Low back pain, unspecified: Secondary | ICD-10-CM | POA: Diagnosis not present

## 2021-01-15 DIAGNOSIS — M546 Pain in thoracic spine: Secondary | ICD-10-CM | POA: Diagnosis not present

## 2021-01-19 DIAGNOSIS — M546 Pain in thoracic spine: Secondary | ICD-10-CM | POA: Diagnosis not present

## 2021-01-21 DIAGNOSIS — M545 Low back pain, unspecified: Secondary | ICD-10-CM | POA: Diagnosis not present

## 2021-01-21 DIAGNOSIS — M546 Pain in thoracic spine: Secondary | ICD-10-CM | POA: Diagnosis not present

## 2021-01-22 ENCOUNTER — Other Ambulatory Visit: Payer: Self-pay | Admitting: Orthopedic Surgery

## 2021-01-22 DIAGNOSIS — M546 Pain in thoracic spine: Secondary | ICD-10-CM

## 2021-01-27 DIAGNOSIS — M4624 Osteomyelitis of vertebra, thoracic region: Secondary | ICD-10-CM | POA: Insufficient documentation

## 2021-01-28 ENCOUNTER — Ambulatory Visit
Admission: RE | Admit: 2021-01-28 | Discharge: 2021-01-28 | Disposition: A | Discharge status: Home / Self Care | Payer: Self-pay | Source: Ambulatory Visit | Attending: Orthopedic Surgery | Admitting: Orthopedic Surgery

## 2021-01-28 ENCOUNTER — Other Ambulatory Visit: Payer: Self-pay | Admitting: Orthopedic Surgery

## 2021-01-28 ENCOUNTER — Encounter: Payer: Self-pay | Admitting: Internal Medicine

## 2021-01-28 ENCOUNTER — Other Ambulatory Visit: Payer: Self-pay

## 2021-01-28 ENCOUNTER — Ambulatory Visit: Payer: Medicare HMO | Admitting: Internal Medicine

## 2021-01-28 DIAGNOSIS — I2510 Atherosclerotic heart disease of native coronary artery without angina pectoris: Secondary | ICD-10-CM

## 2021-01-28 DIAGNOSIS — M546 Pain in thoracic spine: Secondary | ICD-10-CM

## 2021-01-28 DIAGNOSIS — M4624 Osteomyelitis of vertebra, thoracic region: Secondary | ICD-10-CM | POA: Diagnosis not present

## 2021-01-28 DIAGNOSIS — G373 Acute transverse myelitis in demyelinating disease of central nervous system: Secondary | ICD-10-CM | POA: Diagnosis not present

## 2021-01-28 NOTE — Progress Notes (Addendum)
Robert for Infectious Disease  Reason for Consult: Thoracic discitis and osteomyelitis at the T7-8 level Referring Provider: Dr. Suella Broad  Assessment: He has slowly progressing thoracic vertebral infection.  He is scheduled to undergo needle aspirate tomorrow by radiology.  He will get baseline blood work today and follow-up with me on 02/03/2021.  Once I have results of his Gram stain and culture I can determine an optimal antibiotic regimen to hopefully cure his infection.  He and his wife are in agreement with that plan.  Addendum: Robert [name] underwent T7-8 aspiration.  2 cc of bloody fluid was obtained.  Gram stain shows abundant white blood cells and gram-positive cocci in pairs.  I discussed these preliminary results with his wife by phone (he was still sedated following the procedure this morning).  I recommended proceeding with PICC placement and starting empiric vancomycin pending final culture results.  She is in agreement  Plan: He will need blood work including CBC, CMP, sed rate and C-reactive protein PICC placement Start empiric vancomycin pending final culture results Follow-up on 02/03/2021  Diagnosis: Thoracic osteomyelitis  Culture Result: Gram-positive cocci in pairs on gram stain; cultures pending  Allergies  Allergen Reactions   Fluorescein Hives and Itching   Inspra [Eplerenone] Other (See Comments)    gynecomastia    Typhoid Vaccines Swelling   Yellow Dye Hives and Itching    Dye in eye test   Codeine Other (See Comments)    Puts me to sleep    OPAT Orders Discharge antibiotics to be given via PICC line Discharge antibiotics: Per pharmacy protocol vancomycin Aim for Vancomycin trough 15-20 or AUC 400-550 (unless otherwise indicated) Duration: 6 weeks End Date: 03/15/2021  [facility] Care Per Protocol:  Home health RN for IV administration and teaching; PICC line care and labs.    Labs weekly while on IV antibiotics: _x_ CBC  with differential _x_ BMP __ CMP _x_ CRP _x_ ESR _x_ Vancomycin trough __ CK  __ Please pull PIC at completion of IV antibiotics _x_ Please leave PIC in place until doctor has seen patient or been notified  Fax weekly labs to [phone]   Patient Active Problem List   Diagnosis Date Noted   Acute osteomyelitis of thoracic spine ([place]) 01/27/2021    Priority: High   CAD (coronary artery disease) 01/28/2021   Transverse myelitis ([place]) 01/28/2021   Peripheral arterial disease ([place]) 29/92/4268   Chronic systolic heart failure ([place]) 03/22/2018   S/P CABG x 4 03/16/2018   Rhegmatogenous retinal detachment of right eye 05/02/2014   Macular hole, right eye 03/05/2014   Macular hole of left eye 12/23/2011    Patient's Medications  New Prescriptions   No medications on file  Previous Medications   ASPIRIN EC 325 MG EC TABLET    Take 1 tablet (325 mg total) by mouth daily.   ATORVASTATIN (LIPITOR) 40 MG TABLET    Take 1 tablet (40 mg total) by mouth at bedtime.   CARVEDILOL (COREG) 25 MG TABLET    TAKE 1 TABLET(25 MG) BY MOUTH TWICE DAILY   ENTRESTO 97-103 MG    TAKE 1 TABLET BY MOUTH TWICE DAILY   FEXOFENADINE HCL (MUCINEX ALLERGY PO)    Take by mouth daily.   ICOSAPENT ETHYL (VASCEPA) 1 G CAPSULE    Take 2 capsules (2 g total) by mouth 2 (two) times daily.   LORATADINE (CLARITIN) 10 MG TABLET    Take 10 mg by  mouth daily.   POLYETHYL GLYCOL-PROPYL GLYCOL 0.4-0.3 % SOLN    Place 1 drop into both eyes 2 (two) times daily.   TIZANIDINE (ZANAFLEX) 4 MG CAPSULE    Take 1 capsule (4 mg total) by mouth 3 (three) times daily as needed for muscle spasms.  Modified Medications   No medications on file  Discontinued Medications   No medications on file    HPI: Robert [name] is a 80 y.o. male with a history of transverse myelitis diagnosed and treated by Dr. Clair Gulling love in 2009.  Fortunately he had complete recovery.  He also has coronary artery disease and underwent CABG several  years ago.  His wife tells me that he had pneumonia following his surgery and has had it several times since.  They recall that he was treated for pneumonia as an outpatient in February of this year.  He mentions that he had a COVID and that he was treated with a Z-Pak and prednisone.  He does not recall having any fever at that time.  In March she began to have mid back pain.  He does not recall any injury.  He has never had any spinal surgery.  He saw his PCP, Dr. Carrolyn Meiers, who prescribed tizanidine which gave only partial relief.  He underwent a chest CT scan on 10/10/2020 which showed:   IMPRESSION: 1. Diffuse bilateral bronchial wall thickening, consistent with nonspecific infectious or inflammatory bronchitis. 2. Bandlike atelectasis or consolidation of the left lung base. 3. There are numerous small pulmonary nodules scattered throughout the upper lobes, measuring 3 mm or smaller, nonspecific and infectious or inflammatory. 4. Coronary artery disease.  He was later referred to Dr. Melina Schools who felt that he most likely had muscle strain and ordered physical therapy.  His back pain is gotten progressively more severe.  He says it feels like a knife twisting in him.  The pain wraps around his anterior chest.  He has not had any recent fever, chills or sweats.  He says that he has not been eating much because of the pain and has lost some weight.  He was referred to Dr. Delfino Lovett [facility]'s for pain management.  An MRI was ordered and performed on 01/19/2021.  It showed:  IMPRESSION: 1.  Changes of discitis/osteomyelitis centered at the T7-8 disc with extensive involvement of T7, T8 and the posterior elements, early involvement of the posterior right aspect of T9.  Anterior epidural phlegmon versus abscess extending from the upper T7 level to the lower T8 level and prevertebral phlegmon more likely than abscess extending from T5 through the upper T10 level.  He was started on hydrocodone with some  improved pain control.  He has not been on any recent antibiotic therapy.  Review of Systems: Review of Systems  Constitutional:  Positive for malaise/fatigue and weight loss. Negative for chills, diaphoresis and fever.  Respiratory:  Positive for cough. Negative for sputum production and shortness of breath.   Cardiovascular:  Positive for chest pain.  Gastrointestinal:  Negative for abdominal pain, diarrhea, nausea and vomiting.  Genitourinary:  Negative for dysuria.  Musculoskeletal:  Positive for back pain.  Skin:        He fell in January and scraped his left shin.  That area has been very slow to heal but has not shown any signs of infection.  Neurological:  Negative for focal weakness.     Past Medical History:  Diagnosis Date   CHF (congestive heart failure) ([place])  Childhood asthma    Coronary artery disease    Hay fever    Hypertension    Macular hole of left eye 12/23/2011   Macular hole, right eye 03/05/2014   Myocardial infarction [facility])    June 2019   Neuromuscular disorder (H[facility])    numbness in feet   PONV (postoperative nausea and vomiting)    Presence of external cardiac defibrillator 2019   Rhegmatogenous retinal detachment of right eye 05/02/2014   Sinus headache    "hay fever season; not that often"   Transverse myelitis ([place]) 2009   was treated by Dr Erling Cruz   Umbilical hernia     Social History   Tobacco Use   Smoking status: Former    Packs/day: 2.00    Years: 52.00    Pack years: 104.00    Types: Cigarettes    Quit date: 12/18/2017    Years since quitting: 3.1   Smokeless tobacco: Never  Vaping Use   Vaping Use: Never used  Substance Use Topics   Alcohol use: No   Drug use: No    Family History  Problem Relation Age of Onset   Heart disease Mother    Cancer Mother    Heart attack Father    Allergies  Allergen Reactions   Fluorescein Hives and Itching   Inspra [Eplerenone]     gynecomastia    Typhoid Vaccines Swelling   Yellow Dye  Hives and Itching    Dye in eye test   Codeine Other (See Comments)    Puts me to sleep    OBJECTIVE: Vitals:   01/28/21 1055  BP: 119/64  Pulse: 80  Temp: (!) 97.5 F (36.4 C)  TempSrc: Oral  SpO2: 94%  Weight: 193 lb 6.4 oz (87.7 kg)   Body mass index is 27.36 kg/m.   Physical Exam Constitutional:      Comments: He is pleasant but obviously in pain.  He has a pillow clutched to his chest.  He is accompanied by his wife.  Cardiovascular:     Rate and Rhythm: Normal rate and regular rhythm.     Heart sounds: No murmur heard. Pulmonary:     Effort: Pulmonary effort is normal.     Breath sounds: Normal breath sounds. No wheezing, rhonchi or rales.  Abdominal:     Palpations: Abdomen is soft.     Tenderness: There is no abdominal tenderness.  Musculoskeletal:       Arms:  Skin:    Comments: He has superficial abrasions on his left shin.  Neurological:     General: No focal deficit present.    Microbiology: No results found for this or any previous visit (from the past 240 hour(s)).  Michel Bickers, MD [facility] for Infectious [place] [facility] [phone] pager   [phone] cell 01/28/2021, 12:41 PM

## 2021-01-29 ENCOUNTER — Other Ambulatory Visit (HOSPITAL_COMMUNITY): Payer: Self-pay

## 2021-01-29 ENCOUNTER — Ambulatory Visit
Admission: RE | Admit: 2021-01-29 | Discharge: 2021-01-29 | Disposition: A | Discharge status: Home / Self Care | Payer: Medicare HMO | Source: Ambulatory Visit | Attending: Orthopedic Surgery | Admitting: Orthopedic Surgery

## 2021-01-29 ENCOUNTER — Telehealth: Payer: Self-pay

## 2021-01-29 DIAGNOSIS — M546 Pain in thoracic spine: Secondary | ICD-10-CM

## 2021-01-29 DIAGNOSIS — M4623 Osteomyelitis of vertebra, cervicothoracic region: Secondary | ICD-10-CM | POA: Diagnosis not present

## 2021-01-29 DIAGNOSIS — M4643 Discitis, unspecified, cervicothoracic region: Secondary | ICD-10-CM | POA: Diagnosis not present

## 2021-01-29 MED ORDER — KETOROLAC TROMETHAMINE 30 MG/ML IJ SOLN
30.0000 mg | Freq: Once | INTRAMUSCULAR | Status: AC
  Administered 2021-01-29: 15 mg via INTRAVENOUS

## 2021-01-29 MED ORDER — VANCOMYCIN HCL IN DEXTROSE 1-5 GM/200ML-% IV SOLN: 1000.0000 mg | INTRAVENOUS | Status: DC

## 2021-01-29 MED ORDER — SODIUM CHLORIDE 0.9 % IV SOLN: INTRAVENOUS | Status: DC

## 2021-01-29 MED ORDER — FENTANYL CITRATE PF 50 MCG/ML IJ SOSY
25.0000 ug | PREFILLED_SYRINGE | INTRAMUSCULAR | Status: DC | PRN
  Administered 2021-01-29: 25 ug via INTRAVENOUS
  Administered 2021-01-29: 50 ug via INTRAVENOUS
  Administered 2021-01-29 (×2): 25 ug via INTRAVENOUS

## 2021-01-29 MED ORDER — MIDAZOLAM HCL 2 MG/2ML IJ SOLN
1.0000 mg | INTRAMUSCULAR | Status: DC | PRN
  Administered 2021-01-29: 1 mg via INTRAVENOUS
  Administered 2021-01-29: 0.5 mg via INTRAVENOUS

## 2021-01-29 NOTE — Addendum Note (Signed)
Addended by: Michel Bickers on: 01/29/2021 02:39 PM   Modules accepted: Orders

## 2021-01-29 NOTE — Discharge Instructions (Signed)
Disc Aspiration Post Procedure Discharge Instructions  May resume a regular diet and any medications that you routinely take (including pain medications). No driving day of procedure. Upon discharge go home and rest for at least 4 hours.  May use an ice pack as needed to injection sites on back. Remove bandades later, today.    Please contact our office at [phone] for the following symptoms:  Fever greater than 100 degrees Increased swelling, pain, or redness at injection site.   Thank you for visiting [facility].   YOU MAY RESUME ASPIRIN TODAY POST PROCEDURE.

## 2021-01-29 NOTE — Telephone Encounter (Signed)
Per Dr. Megan Salon, patient scheduled for PICC placement on 02/03/2021 at noon with first dose to follow. Orders faxed to [facility] and short stay. Family notified and will arrive at 11:45 for appointment and see Dr. Megan Salon on the same day. Unable to get appt scheduled sooner due to home health arrangements and insurance approval.   Robert Mead Sussie Minor, RN   IV Vanc per protocol, weekly CBC, BMP, CRP, ESR and vanc level

## 2021-01-29 NOTE — Progress Notes (Signed)
Pt back in nursing recovery area. Pt still drowsy from procedure but will wake up when spoken to. Pt follows commands, talks in complete sentences and has no complaints at this time. Pt will be monitored until discharged by Radiologist.   

## 2021-01-30 ENCOUNTER — Other Ambulatory Visit: Payer: Self-pay | Admitting: Internal Medicine

## 2021-01-30 ENCOUNTER — Telehealth: Payer: Self-pay

## 2021-01-30 ENCOUNTER — Ambulatory Visit (HOSPITAL_COMMUNITY)
Admission: RE | Admit: 2021-01-30 | Discharge: 2021-01-30 | Disposition: A | Discharge status: Home / Self Care | Payer: Medicare HMO | Source: Ambulatory Visit | Attending: Internal Medicine | Admitting: Internal Medicine

## 2021-01-30 ENCOUNTER — Other Ambulatory Visit: Payer: Self-pay

## 2021-01-30 DIAGNOSIS — M4624 Osteomyelitis of vertebra, thoracic region: Secondary | ICD-10-CM | POA: Insufficient documentation

## 2021-01-30 DIAGNOSIS — Z452 Encounter for adjustment and management of vascular access device: Secondary | ICD-10-CM | POA: Diagnosis not present

## 2021-01-30 MED ORDER — VANCOMYCIN HCL 1500 MG/300ML IV SOLN
1500.0000 mg | Freq: Once | INTRAVENOUS | Status: AC
  Administered 2021-01-30: 1500 mg via INTRAVENOUS

## 2021-01-30 MED ORDER — HEPARIN SOD (PORK) LOCK FLUSH 100 UNIT/ML IV SOLN
250.0000 [IU] | INTRAVENOUS | Status: AC | PRN
  Administered 2021-01-30: 250 [IU]

## 2021-01-30 MED ORDER — HEPARIN SOD (PORK) LOCK FLUSH 100 UNIT/ML IV SOLN
INTRAVENOUS | Status: AC
  Filled 2021-01-30: qty 5

## 2021-01-30 MED ORDER — LIDOCAINE HCL 1 % IJ SOLN
INTRAMUSCULAR | Status: AC
  Filled 2021-01-30: qty 20

## 2021-01-30 MED ORDER — LIDOCAINE HCL 1 % IJ SOLN
INTRAMUSCULAR | Status: DC | PRN
  Administered 2021-01-30: 10 mL

## 2021-01-30 NOTE — Telephone Encounter (Signed)
RN spoke with Lattie Haw at [facility] cath lab and gave verbal orders for IV vancomycin per Dr. Alahia Whicker Salon. Confirmed dose of '1500mg'$  with Nicoletta Dress, [facility].   Beryle Flock, RN

## 2021-01-30 NOTE — Telephone Encounter (Addendum)
Patient's wife called requesting the patient's picc appointment be sooner. She states she is concerned about waiting until next week for patient to start IV antibiotics. I have spoke with Anderson Malta with IR and she is able to work the patient in today to have picc placement. I have advised the patient that he could head over to IR now per [facility]. Patient will also be getting first dose at short stay.  I have also spoke with Stanton Kidney and she has arranged for patient to be setup with [facility]. Alvis Lemmings will be going out to the patient home tomorrow to do teaching and administer vancomycin.  Dannon Nguyenthi T Brooks Sailors

## 2021-01-30 NOTE — Procedures (Signed)
PROCEDURE SUMMARY:  Successful placement of single lumen PICC line to right brachial vein. Length 49 cm Tip at lower SVC/RA PICC capped No complications Ready for use  EBL < 5 mL   Robert [name] H Shown Dissinger PA-C 01/30/2021, 4:05 PM

## 2021-01-30 NOTE — Progress Notes (Signed)
Received pt from IR with PICC line.   Vancomycin  ordered per Dr Megan Salon and Dorothea Ogle [facility]

## 2021-01-31 DIAGNOSIS — B9689 Other specified bacterial agents as the cause of diseases classified elsewhere: Secondary | ICD-10-CM | POA: Diagnosis not present

## 2021-01-31 DIAGNOSIS — I110 Hypertensive heart disease with heart failure: Secondary | ICD-10-CM | POA: Diagnosis not present

## 2021-01-31 DIAGNOSIS — R918 Other nonspecific abnormal finding of lung field: Secondary | ICD-10-CM | POA: Diagnosis not present

## 2021-01-31 DIAGNOSIS — M4644 Discitis, unspecified, thoracic region: Secondary | ICD-10-CM | POA: Diagnosis not present

## 2021-01-31 DIAGNOSIS — M4624 Osteomyelitis of vertebra, thoracic region: Secondary | ICD-10-CM | POA: Diagnosis not present

## 2021-01-31 DIAGNOSIS — I2510 Atherosclerotic heart disease of native coronary artery without angina pectoris: Secondary | ICD-10-CM | POA: Diagnosis not present

## 2021-01-31 DIAGNOSIS — I739 Peripheral vascular disease, unspecified: Secondary | ICD-10-CM | POA: Diagnosis not present

## 2021-01-31 DIAGNOSIS — G373 Acute transverse myelitis in demyelinating disease of central nervous system: Secondary | ICD-10-CM | POA: Diagnosis not present

## 2021-01-31 DIAGNOSIS — I5022 Chronic systolic (congestive) heart failure: Secondary | ICD-10-CM | POA: Diagnosis not present

## 2021-02-02 DIAGNOSIS — I739 Peripheral vascular disease, unspecified: Secondary | ICD-10-CM | POA: Diagnosis not present

## 2021-02-02 DIAGNOSIS — B9689 Other specified bacterial agents as the cause of diseases classified elsewhere: Secondary | ICD-10-CM | POA: Diagnosis not present

## 2021-02-02 DIAGNOSIS — M4624 Osteomyelitis of vertebra, thoracic region: Secondary | ICD-10-CM | POA: Diagnosis not present

## 2021-02-02 DIAGNOSIS — M4644 Discitis, unspecified, thoracic region: Secondary | ICD-10-CM | POA: Diagnosis not present

## 2021-02-02 DIAGNOSIS — I110 Hypertensive heart disease with heart failure: Secondary | ICD-10-CM | POA: Diagnosis not present

## 2021-02-02 DIAGNOSIS — I2510 Atherosclerotic heart disease of native coronary artery without angina pectoris: Secondary | ICD-10-CM | POA: Diagnosis not present

## 2021-02-02 DIAGNOSIS — R918 Other nonspecific abnormal finding of lung field: Secondary | ICD-10-CM | POA: Diagnosis not present

## 2021-02-02 DIAGNOSIS — G373 Acute transverse myelitis in demyelinating disease of central nervous system: Secondary | ICD-10-CM | POA: Diagnosis not present

## 2021-02-02 DIAGNOSIS — I5022 Chronic systolic (congestive) heart failure: Secondary | ICD-10-CM | POA: Diagnosis not present

## 2021-02-03 ENCOUNTER — Ambulatory Visit (HOSPITAL_COMMUNITY): Payer: Medicare HMO

## 2021-02-03 ENCOUNTER — Telehealth: Payer: Self-pay

## 2021-02-03 ENCOUNTER — Ambulatory Visit (INDEPENDENT_AMBULATORY_CARE_PROVIDER_SITE_OTHER): Payer: Medicare HMO | Admitting: Internal Medicine

## 2021-02-03 ENCOUNTER — Inpatient Hospital Stay (HOSPITAL_COMMUNITY): Admission: RE | Admit: 2021-02-03 | Payer: Medicare HMO | Source: Ambulatory Visit

## 2021-02-03 ENCOUNTER — Other Ambulatory Visit: Payer: Self-pay

## 2021-02-03 DIAGNOSIS — M4624 Osteomyelitis of vertebra, thoracic region: Secondary | ICD-10-CM | POA: Diagnosis not present

## 2021-02-03 LAB — ANAEROBIC AND AEROBIC CULTURE
MICRO NUMBER:: 12174202
MICRO NUMBER:: 12174203
SPECIMEN QUALITY:: ADEQUATE
SPECIMEN QUALITY:: ADEQUATE

## 2021-02-03 LAB — CELL COUNT AND DIFF, FLUID, OTHER
Basophils, %: 0 %
Eosinophils, %: 0 %
Lymphocytes, %: 4 %
Mesothelial, %: 0 %
Monocyte/Macrophage %: 0 %
Neutrophils, %: 96 %
Total Nucleated Cell Ct: 23450 cells/uL

## 2021-02-03 MED ORDER — CEFTRIAXONE SODIUM 1 G IJ SOLR: 2.0000 g | INTRAMUSCULAR | 0 refills | Status: DC

## 2021-02-03 NOTE — Telephone Encounter (Signed)
Gave verbal orders to Melissa at [facility] to Discontinue Vancomycin and begin Ceftriaxone 2g IV daily x 6 weeks. Also asked that they do weekly labs [facility] and fax results to Dr Megan Salon. Patient to get first dose (02/04/21 @ 2pm.)at [facility] via IM injection of 250 Rocephin since [place] can not administer first dose in the home. Melissa understood and orders were signed and faxed.

## 2021-02-03 NOTE — Progress Notes (Signed)
Virtual Visit via Telephone Note  I connected with Robert [name] on 02/03/21 at  4:00 PM EDT by telephone and verified that I am speaking with the correct person using two identifiers.  Location: Patient: Home Provider: [facility]   I discussed the limitations, risks, security and privacy concerns of performing an evaluation and management service by telephone and the availability of in person appointments. I also discussed with the patient that there may be a patient responsible charge related to this service. The patient expressed understanding and agreed to proceed.   History of Present Illness: I called and spoke with [facility] today.  He underwent aspiration of the T7-8 disc on 01/29/2021.  Cultures ended up growing Streptococcus mitis sensitive to all antibiotics tested.  His PICC was placed on 01/31/2021 and he has started on vancomycin.  He has not had any problems managing his PICC or vancomycin   Observations/Objective: T7-8 culture Streptococcus mitis  Assessment and Plan: He has streptococcal thoracic osteomyelitis.  I will change vancomycin to ceftriaxone which has a better safety profile and requires less monitoring.  He will need a minimum of 6 weeks of therapy.  I will have him come to clinic tomorrow to get his initial dose of IM ceftriaxone 250 mg before switching to IV ceftriaxone at home.  Follow Up Instructions: Change vancomycin to ceftriaxone 2 g IV daily Phone follow-up in 2 weeks  Diagnosis: Thoracic osteomyelitis  Culture Result: Streptococcus mitis  Allergies  Allergen Reactions   Fluorescein Hives and Itching   Inspra [Eplerenone] Other (See Comments)    gynecomastia    Typhoid Vaccines Swelling   Yellow Dye Hives and Itching    Dye in eye test   Codeine Other (See Comments)    Puts me to sleep    OPAT Orders Discharge antibiotics to be given via PICC line Discharge antibiotics: Per pharmacy protocol ceftriaxone  Duration: 6 weeks End  Date: 03/19/2021  [facility] Per Protocol:  Home health RN for IV administration and teaching; PICC line care and labs.    Labs weekly while on IV antibiotics: _x_ CBC with differential _x_ BMP __ CMP _x_ CRP _x_ ESR __ Vancomycin trough __ CK  __ Please pull PIC at completion of IV antibiotics _x_ Please leave PIC in place until doctor has seen patient or been notified  Fax weekly labs to [phone]  Clinic Follow Up Appt: 02/17/2021      I discussed the assessment and treatment plan with the patient. The patient was provided an opportunity to ask questions and all were answered. The patient agreed with the plan and demonstrated an understanding of the instructions.   The patient was advised to call back or seek an in-person evaluation if the symptoms worsen or if the condition fails to improve as anticipated.  I provided 16 minutes of non-face-to-face time during this encounter.   Michel Bickers, MD

## 2021-02-04 ENCOUNTER — Ambulatory Visit (INDEPENDENT_AMBULATORY_CARE_PROVIDER_SITE_OTHER): Payer: Medicare HMO

## 2021-02-04 ENCOUNTER — Other Ambulatory Visit: Payer: Self-pay

## 2021-02-04 DIAGNOSIS — M4624 Osteomyelitis of vertebra, thoracic region: Secondary | ICD-10-CM

## 2021-02-04 MED ORDER — CEFTRIAXONE SODIUM 500 MG IJ SOLR
500.0000 mg | Freq: Once | INTRAMUSCULAR | Status: AC
  Administered 2021-02-04: 500 mg via INTRAMUSCULAR

## 2021-02-04 NOTE — Telephone Encounter (Signed)
Office does not carry '250mg'$  ceftriaxone IM. RN spoke with Dr. Kawehi Hostetter Salon who gave verbal orders for ceftriaxone IM '500mg'$  once for first dose.   Beryle Flock, RN

## 2021-02-04 NOTE — Progress Notes (Signed)
Patient here for first dose of ceftriaxone IM 500 mg per Dr. Kawika Bischoff Salon. Patient tolerated injection well and was kept in office for monitoring for 25 minutes without any signs of adverse reactions. RN advised patient to seek emergent care if he develops hives, rash, itching, wheezing, hoarseness, or shortness of breath. Patient and his wife have questions regarding administration of new antibiotic via PICC line. RN advised that ceftriaxone will most likely be given IV push, but instructed them to reach out to their home health nurse for further education. Patient verbalized understanding and has no further questions.   Notified Mary at [facility] that patient received first dose without difficulty.   Beryle Flock, RN

## 2021-02-05 DIAGNOSIS — B9689 Other specified bacterial agents as the cause of diseases classified elsewhere: Secondary | ICD-10-CM | POA: Diagnosis not present

## 2021-02-05 DIAGNOSIS — I5022 Chronic systolic (congestive) heart failure: Secondary | ICD-10-CM | POA: Diagnosis not present

## 2021-02-05 DIAGNOSIS — R918 Other nonspecific abnormal finding of lung field: Secondary | ICD-10-CM | POA: Diagnosis not present

## 2021-02-05 DIAGNOSIS — G373 Acute transverse myelitis in demyelinating disease of central nervous system: Secondary | ICD-10-CM | POA: Diagnosis not present

## 2021-02-05 DIAGNOSIS — M4644 Discitis, unspecified, thoracic region: Secondary | ICD-10-CM | POA: Diagnosis not present

## 2021-02-05 DIAGNOSIS — I110 Hypertensive heart disease with heart failure: Secondary | ICD-10-CM | POA: Diagnosis not present

## 2021-02-05 DIAGNOSIS — M4624 Osteomyelitis of vertebra, thoracic region: Secondary | ICD-10-CM | POA: Diagnosis not present

## 2021-02-05 DIAGNOSIS — I739 Peripheral vascular disease, unspecified: Secondary | ICD-10-CM | POA: Diagnosis not present

## 2021-02-05 DIAGNOSIS — I2510 Atherosclerotic heart disease of native coronary artery without angina pectoris: Secondary | ICD-10-CM | POA: Diagnosis not present

## 2021-02-07 DIAGNOSIS — M4644 Discitis, unspecified, thoracic region: Secondary | ICD-10-CM | POA: Diagnosis not present

## 2021-02-07 DIAGNOSIS — M4624 Osteomyelitis of vertebra, thoracic region: Secondary | ICD-10-CM | POA: Diagnosis not present

## 2021-02-09 DIAGNOSIS — I2510 Atherosclerotic heart disease of native coronary artery without angina pectoris: Secondary | ICD-10-CM | POA: Diagnosis not present

## 2021-02-09 DIAGNOSIS — M4624 Osteomyelitis of vertebra, thoracic region: Secondary | ICD-10-CM | POA: Diagnosis not present

## 2021-02-09 DIAGNOSIS — G373 Acute transverse myelitis in demyelinating disease of central nervous system: Secondary | ICD-10-CM | POA: Diagnosis not present

## 2021-02-09 DIAGNOSIS — I5022 Chronic systolic (congestive) heart failure: Secondary | ICD-10-CM | POA: Diagnosis not present

## 2021-02-09 DIAGNOSIS — R918 Other nonspecific abnormal finding of lung field: Secondary | ICD-10-CM | POA: Diagnosis not present

## 2021-02-09 DIAGNOSIS — B9689 Other specified bacterial agents as the cause of diseases classified elsewhere: Secondary | ICD-10-CM | POA: Diagnosis not present

## 2021-02-09 DIAGNOSIS — I110 Hypertensive heart disease with heart failure: Secondary | ICD-10-CM | POA: Diagnosis not present

## 2021-02-09 DIAGNOSIS — M4644 Discitis, unspecified, thoracic region: Secondary | ICD-10-CM | POA: Diagnosis not present

## 2021-02-09 DIAGNOSIS — I739 Peripheral vascular disease, unspecified: Secondary | ICD-10-CM | POA: Diagnosis not present

## 2021-02-11 DIAGNOSIS — R918 Other nonspecific abnormal finding of lung field: Secondary | ICD-10-CM | POA: Diagnosis not present

## 2021-02-11 DIAGNOSIS — B9689 Other specified bacterial agents as the cause of diseases classified elsewhere: Secondary | ICD-10-CM | POA: Diagnosis not present

## 2021-02-11 DIAGNOSIS — G373 Acute transverse myelitis in demyelinating disease of central nervous system: Secondary | ICD-10-CM | POA: Diagnosis not present

## 2021-02-11 DIAGNOSIS — M4644 Discitis, unspecified, thoracic region: Secondary | ICD-10-CM | POA: Diagnosis not present

## 2021-02-11 DIAGNOSIS — I739 Peripheral vascular disease, unspecified: Secondary | ICD-10-CM | POA: Diagnosis not present

## 2021-02-11 DIAGNOSIS — I2510 Atherosclerotic heart disease of native coronary artery without angina pectoris: Secondary | ICD-10-CM | POA: Diagnosis not present

## 2021-02-11 DIAGNOSIS — I110 Hypertensive heart disease with heart failure: Secondary | ICD-10-CM | POA: Diagnosis not present

## 2021-02-11 DIAGNOSIS — I5022 Chronic systolic (congestive) heart failure: Secondary | ICD-10-CM | POA: Diagnosis not present

## 2021-02-11 DIAGNOSIS — M4624 Osteomyelitis of vertebra, thoracic region: Secondary | ICD-10-CM | POA: Diagnosis not present

## 2021-02-13 DIAGNOSIS — M4624 Osteomyelitis of vertebra, thoracic region: Secondary | ICD-10-CM | POA: Diagnosis not present

## 2021-02-13 DIAGNOSIS — M4644 Discitis, unspecified, thoracic region: Secondary | ICD-10-CM | POA: Diagnosis not present

## 2021-02-16 ENCOUNTER — Telehealth: Payer: Self-pay

## 2021-02-16 DIAGNOSIS — M4624 Osteomyelitis of vertebra, thoracic region: Secondary | ICD-10-CM | POA: Diagnosis not present

## 2021-02-16 DIAGNOSIS — G373 Acute transverse myelitis in demyelinating disease of central nervous system: Secondary | ICD-10-CM | POA: Diagnosis not present

## 2021-02-16 DIAGNOSIS — I2510 Atherosclerotic heart disease of native coronary artery without angina pectoris: Secondary | ICD-10-CM | POA: Diagnosis not present

## 2021-02-16 DIAGNOSIS — I110 Hypertensive heart disease with heart failure: Secondary | ICD-10-CM | POA: Diagnosis not present

## 2021-02-16 DIAGNOSIS — I739 Peripheral vascular disease, unspecified: Secondary | ICD-10-CM | POA: Diagnosis not present

## 2021-02-16 DIAGNOSIS — B9689 Other specified bacterial agents as the cause of diseases classified elsewhere: Secondary | ICD-10-CM | POA: Diagnosis not present

## 2021-02-16 DIAGNOSIS — M4644 Discitis, unspecified, thoracic region: Secondary | ICD-10-CM | POA: Diagnosis not present

## 2021-02-16 DIAGNOSIS — R918 Other nonspecific abnormal finding of lung field: Secondary | ICD-10-CM | POA: Diagnosis not present

## 2021-02-16 DIAGNOSIS — I5022 Chronic systolic (congestive) heart failure: Secondary | ICD-10-CM | POA: Diagnosis not present

## 2021-02-16 NOTE — Telephone Encounter (Signed)
Patient's wife called office to ask if someone here would be able to infuse antibiotics while in office. States patient will be late for his dose tomorrow due to his appointment. Spoke with Cassie, Pharmacist who said is okay if patient wants to infuse medication an hour early to avoid delayed dose due to schedule conflict.  Patient's wife is okay with this and will infuse antibiotics before appt. Leatrice Jewels, RMA

## 2021-02-17 ENCOUNTER — Telehealth: Payer: Self-pay

## 2021-02-17 ENCOUNTER — Ambulatory Visit: Payer: Medicare HMO | Admitting: Internal Medicine

## 2021-02-17 ENCOUNTER — Other Ambulatory Visit: Payer: Self-pay

## 2021-02-17 ENCOUNTER — Encounter: Payer: Self-pay | Admitting: Internal Medicine

## 2021-02-17 DIAGNOSIS — M4624 Osteomyelitis of vertebra, thoracic region: Secondary | ICD-10-CM

## 2021-02-17 NOTE — Assessment & Plan Note (Signed)
He is improving slowly and his inflammatory markers are coming back to normal on therapy for thoracic vertebral infection.  He will continue daily ceftriaxone for minimum of 6 weeks through 03/19/2021.

## 2021-02-17 NOTE — Progress Notes (Signed)
[facility] for Infectious Disease  Patient Active Problem List   Diagnosis Date Noted   Acute osteomyelitis of thoracic spine ([place]) 01/27/2021    Priority: High   CAD (coronary artery disease) 01/28/2021   Transverse myelitis ([place]) 01/28/2021   Peripheral arterial disease ([place]) 123456   Chronic systolic heart failure ([place]) 03/22/2018   S/P CABG x 4 03/16/2018   Rhegmatogenous retinal detachment of right eye 05/02/2014   Macular hole, right eye 03/05/2014   Macular hole of left eye 12/23/2011    Patient's Medications  New Prescriptions   No medications on file  Previous Medications   ASPIRIN EC 325 MG EC TABLET    Take 1 tablet (325 mg total) by mouth daily.   ATORVASTATIN (LIPITOR) 40 MG TABLET    Take 1 tablet (40 mg total) by mouth at bedtime.   CARVEDILOL (COREG) 25 MG TABLET    TAKE 1 TABLET(25 MG) BY MOUTH TWICE DAILY   CEFTRIAXONE (ROCEPHIN) 1 G INJECTION    Inject 2 g into the muscle daily.   ENTRESTO 97-103 MG    TAKE 1 TABLET BY MOUTH TWICE DAILY   FEXOFENADINE HCL (MUCINEX ALLERGY PO)    Take by mouth daily.   HYDROCODONE-ACETAMINOPHEN (NORCO) 10-325 MG TABLET    hydrocodone 10 mg-acetaminophen 325 mg tablet  Take 1 tablet 3 times a day by oral route as needed for pain for 5 days.   ICOSAPENT ETHYL (VASCEPA) 1 G CAPSULE    Take 2 capsules (2 g total) by mouth 2 (two) times daily.   LORATADINE (CLARITIN) 10 MG TABLET    Take 10 mg by mouth daily.   POLYETHYL GLYCOL-PROPYL GLYCOL 0.4-0.3 % SOLN    Place 1 drop into both eyes 2 (two) times daily.   TIZANIDINE (ZANAFLEX) 4 MG CAPSULE    Take 1 capsule (4 mg total) by mouth 3 (three) times daily as needed for muscle spasms.  Modified Medications   No medications on file  Discontinued Medications   No medications on file    Subjective: Robert [name] is in with his wife for his routine follow-up visit.  He has now been on antibiotics since 01/31/2021 for his T7-8 vertebral infection.  He is taking ceftriaxone  daily.  He has had no problems tolerating his PICC or antibiotics.  He is still requiring a blood of hydrocodone at bedtime but is getting by with less narcotic pain medication.  His sharp back pain has decreased.  Unfortunately he has a bad, chronic cough that causes him to have pain.  Review of Systems: Review of Systems  Constitutional:  Negative for fever.  Respiratory:  Positive for cough.   Cardiovascular:  Negative for chest pain.  Gastrointestinal:  Negative for abdominal pain, diarrhea, nausea and vomiting.  Musculoskeletal:  Positive for back pain.   Past Medical History:  Diagnosis Date   CHF (congestive heart failure) (HCC)    Childhood asthma    Coronary artery disease    Hay fever    Hypertension    Macular hole of left eye 12/23/2011   Macular hole, right eye 03/05/2014   Myocardial infarction [facility])    June 2019   Neuromuscular disorder (HCC)    numbness in feet   PONV (postoperative nausea and vomiting)    Presence of external cardiac defibrillator 2019   Rhegmatogenous retinal detachment of right eye 05/02/2014   Sinus headache    "hay fever season; not that often"   Transverse  myelitis ([place]) 2009   was treated by Dr Erling Cruz   Umbilical hernia     Social History   Tobacco Use   Smoking status: Former    Packs/day: 2.00    Years: 52.00    Pack years: 104.00    Types: Cigarettes    Quit date: 12/18/2017    Years since quitting: 3.1   Smokeless tobacco: Never  Vaping Use   Vaping Use: Never used  Substance Use Topics   Alcohol use: No   Drug use: No    Family History  Problem Relation Age of Onset   Heart disease Mother    Cancer Mother    Heart attack Father     Allergies  Allergen Reactions   Fluorescein Hives and Itching   Inspra [Eplerenone] Other (See Comments)    gynecomastia    Typhoid Vaccines Swelling   Yellow Dye Hives and Itching    Dye in eye test   Codeine Other (See Comments)    Puts me to sleep    Objective: Vitals:    02/17/21 1412  BP: (!) 163/79  Pulse: 69  Temp: 97.7 F (36.5 C)  TempSrc: Oral  Weight: 192 lb (87.1 kg)   Body mass index is 27.16 kg/m.  Physical Exam Constitutional:      Comments: He appears more comfortable than on his previous visit.  He has frequent coughing.  Cardiovascular:     Rate and Rhythm: Normal rate.  Pulmonary:     Effort: Pulmonary effort is normal.     Breath sounds: Normal breath sounds.  Skin:    Comments: Right arm PICC site looks good.  Psychiatric:        Mood and Affect: Mood normal.    Lab Results 02/02/2021 Sed rate 63 C-reactive protein 64  02/09/2021 Sed rate 57 C-reactive protein 6   Problem List Items Addressed This Visit       High   Acute osteomyelitis of thoracic spine ([place])    He is improving slowly and his inflammatory markers are coming back to normal on therapy for thoracic vertebral infection.  He will continue daily ceftriaxone for minimum of 6 weeks through 03/19/2021.        Michel Bickers, MD [facility] for [facility] [place] [facility] [phone] pager   [phone] cell 02/17/2021, 2:34 PM

## 2021-02-17 NOTE — Telephone Encounter (Signed)
I spoke with Robert [name] with [facility] to give verbal orders for patient's IV antibiotic end date. Per Dr. Megan Salon end date 03/19/21. Robert [name] advised and verbalized understanding. [name]

## 2021-02-21 DIAGNOSIS — M4644 Discitis, unspecified, thoracic region: Secondary | ICD-10-CM | POA: Diagnosis not present

## 2021-02-21 DIAGNOSIS — M4624 Osteomyelitis of vertebra, thoracic region: Secondary | ICD-10-CM | POA: Diagnosis not present

## 2021-02-23 DIAGNOSIS — M4624 Osteomyelitis of vertebra, thoracic region: Secondary | ICD-10-CM | POA: Diagnosis not present

## 2021-02-23 DIAGNOSIS — I2510 Atherosclerotic heart disease of native coronary artery without angina pectoris: Secondary | ICD-10-CM | POA: Diagnosis not present

## 2021-02-23 DIAGNOSIS — G373 Acute transverse myelitis in demyelinating disease of central nervous system: Secondary | ICD-10-CM | POA: Diagnosis not present

## 2021-02-23 DIAGNOSIS — I5022 Chronic systolic (congestive) heart failure: Secondary | ICD-10-CM | POA: Diagnosis not present

## 2021-02-23 DIAGNOSIS — I739 Peripheral vascular disease, unspecified: Secondary | ICD-10-CM | POA: Diagnosis not present

## 2021-02-23 DIAGNOSIS — M4644 Discitis, unspecified, thoracic region: Secondary | ICD-10-CM | POA: Diagnosis not present

## 2021-02-23 DIAGNOSIS — B9689 Other specified bacterial agents as the cause of diseases classified elsewhere: Secondary | ICD-10-CM | POA: Diagnosis not present

## 2021-02-23 DIAGNOSIS — R918 Other nonspecific abnormal finding of lung field: Secondary | ICD-10-CM | POA: Diagnosis not present

## 2021-02-23 DIAGNOSIS — I110 Hypertensive heart disease with heart failure: Secondary | ICD-10-CM | POA: Diagnosis not present

## 2021-02-28 DIAGNOSIS — M4624 Osteomyelitis of vertebra, thoracic region: Secondary | ICD-10-CM | POA: Diagnosis not present

## 2021-02-28 DIAGNOSIS — M4644 Discitis, unspecified, thoracic region: Secondary | ICD-10-CM | POA: Diagnosis not present

## 2021-03-02 DIAGNOSIS — M4620 Osteomyelitis of vertebra, site unspecified: Secondary | ICD-10-CM | POA: Diagnosis not present

## 2021-03-02 DIAGNOSIS — M4644 Discitis, unspecified, thoracic region: Secondary | ICD-10-CM | POA: Diagnosis not present

## 2021-03-02 DIAGNOSIS — I2510 Atherosclerotic heart disease of native coronary artery without angina pectoris: Secondary | ICD-10-CM | POA: Diagnosis not present

## 2021-03-02 DIAGNOSIS — G373 Acute transverse myelitis in demyelinating disease of central nervous system: Secondary | ICD-10-CM | POA: Diagnosis not present

## 2021-03-02 DIAGNOSIS — I5022 Chronic systolic (congestive) heart failure: Secondary | ICD-10-CM | POA: Diagnosis not present

## 2021-03-02 DIAGNOSIS — I739 Peripheral vascular disease, unspecified: Secondary | ICD-10-CM | POA: Diagnosis not present

## 2021-03-02 DIAGNOSIS — M4624 Osteomyelitis of vertebra, thoracic region: Secondary | ICD-10-CM | POA: Diagnosis not present

## 2021-03-02 DIAGNOSIS — R918 Other nonspecific abnormal finding of lung field: Secondary | ICD-10-CM | POA: Diagnosis not present

## 2021-03-02 DIAGNOSIS — I110 Hypertensive heart disease with heart failure: Secondary | ICD-10-CM | POA: Diagnosis not present

## 2021-03-02 DIAGNOSIS — B9689 Other specified bacterial agents as the cause of diseases classified elsewhere: Secondary | ICD-10-CM | POA: Diagnosis not present

## 2021-03-04 ENCOUNTER — Encounter (INDEPENDENT_AMBULATORY_CARE_PROVIDER_SITE_OTHER): Payer: Medicare Other | Admitting: Ophthalmology

## 2021-03-08 ENCOUNTER — Other Ambulatory Visit (HOSPITAL_COMMUNITY): Payer: Self-pay | Admitting: Cardiology

## 2021-03-08 DIAGNOSIS — M4624 Osteomyelitis of vertebra, thoracic region: Secondary | ICD-10-CM | POA: Diagnosis not present

## 2021-03-08 DIAGNOSIS — M4644 Discitis, unspecified, thoracic region: Secondary | ICD-10-CM | POA: Diagnosis not present

## 2021-03-10 ENCOUNTER — Other Ambulatory Visit (HOSPITAL_COMMUNITY): Payer: Self-pay | Admitting: Cardiology

## 2021-03-10 DIAGNOSIS — G373 Acute transverse myelitis in demyelinating disease of central nervous system: Secondary | ICD-10-CM | POA: Diagnosis not present

## 2021-03-10 DIAGNOSIS — B9689 Other specified bacterial agents as the cause of diseases classified elsewhere: Secondary | ICD-10-CM | POA: Diagnosis not present

## 2021-03-10 DIAGNOSIS — I110 Hypertensive heart disease with heart failure: Secondary | ICD-10-CM | POA: Diagnosis not present

## 2021-03-10 DIAGNOSIS — R918 Other nonspecific abnormal finding of lung field: Secondary | ICD-10-CM | POA: Diagnosis not present

## 2021-03-10 DIAGNOSIS — M4624 Osteomyelitis of vertebra, thoracic region: Secondary | ICD-10-CM | POA: Diagnosis not present

## 2021-03-10 DIAGNOSIS — M4644 Discitis, unspecified, thoracic region: Secondary | ICD-10-CM | POA: Diagnosis not present

## 2021-03-10 DIAGNOSIS — I5022 Chronic systolic (congestive) heart failure: Secondary | ICD-10-CM | POA: Diagnosis not present

## 2021-03-10 DIAGNOSIS — I739 Peripheral vascular disease, unspecified: Secondary | ICD-10-CM | POA: Diagnosis not present

## 2021-03-10 DIAGNOSIS — I2510 Atherosclerotic heart disease of native coronary artery without angina pectoris: Secondary | ICD-10-CM | POA: Diagnosis not present

## 2021-03-12 NOTE — Telephone Encounter (Signed)
Robert [name] with [facility] called office to confirm patient's end date. Relayed end date as documented. No further questions. Leatrice Jewels, RMA

## 2021-03-15 DIAGNOSIS — M4624 Osteomyelitis of vertebra, thoracic region: Secondary | ICD-10-CM | POA: Diagnosis not present

## 2021-03-15 DIAGNOSIS — M4644 Discitis, unspecified, thoracic region: Secondary | ICD-10-CM | POA: Diagnosis not present

## 2021-03-16 DIAGNOSIS — G373 Acute transverse myelitis in demyelinating disease of central nervous system: Secondary | ICD-10-CM | POA: Diagnosis not present

## 2021-03-16 DIAGNOSIS — M4624 Osteomyelitis of vertebra, thoracic region: Secondary | ICD-10-CM | POA: Diagnosis not present

## 2021-03-16 DIAGNOSIS — I5022 Chronic systolic (congestive) heart failure: Secondary | ICD-10-CM | POA: Diagnosis not present

## 2021-03-16 DIAGNOSIS — R918 Other nonspecific abnormal finding of lung field: Secondary | ICD-10-CM | POA: Diagnosis not present

## 2021-03-16 DIAGNOSIS — I2510 Atherosclerotic heart disease of native coronary artery without angina pectoris: Secondary | ICD-10-CM | POA: Diagnosis not present

## 2021-03-16 DIAGNOSIS — I110 Hypertensive heart disease with heart failure: Secondary | ICD-10-CM | POA: Diagnosis not present

## 2021-03-16 DIAGNOSIS — I739 Peripheral vascular disease, unspecified: Secondary | ICD-10-CM | POA: Diagnosis not present

## 2021-03-16 DIAGNOSIS — M4644 Discitis, unspecified, thoracic region: Secondary | ICD-10-CM | POA: Diagnosis not present

## 2021-03-16 DIAGNOSIS — B9689 Other specified bacterial agents as the cause of diseases classified elsewhere: Secondary | ICD-10-CM | POA: Diagnosis not present

## 2021-03-17 ENCOUNTER — Telehealth: Payer: Self-pay | Admitting: Pharmacist

## 2021-03-17 NOTE — Telephone Encounter (Signed)
[place] agency called (did not get her name) asking for pull PICC orders. Advised that patient's end date is 03/19/21 and ok to pull PICC at the end of treatment.

## 2021-03-23 DIAGNOSIS — M4644 Discitis, unspecified, thoracic region: Secondary | ICD-10-CM | POA: Diagnosis not present

## 2021-03-23 DIAGNOSIS — I2510 Atherosclerotic heart disease of native coronary artery without angina pectoris: Secondary | ICD-10-CM | POA: Diagnosis not present

## 2021-03-23 DIAGNOSIS — I739 Peripheral vascular disease, unspecified: Secondary | ICD-10-CM | POA: Diagnosis not present

## 2021-03-23 DIAGNOSIS — R918 Other nonspecific abnormal finding of lung field: Secondary | ICD-10-CM | POA: Diagnosis not present

## 2021-03-23 DIAGNOSIS — I110 Hypertensive heart disease with heart failure: Secondary | ICD-10-CM | POA: Diagnosis not present

## 2021-03-23 DIAGNOSIS — G373 Acute transverse myelitis in demyelinating disease of central nervous system: Secondary | ICD-10-CM | POA: Diagnosis not present

## 2021-03-23 DIAGNOSIS — B9689 Other specified bacterial agents as the cause of diseases classified elsewhere: Secondary | ICD-10-CM | POA: Diagnosis not present

## 2021-03-23 DIAGNOSIS — I5022 Chronic systolic (congestive) heart failure: Secondary | ICD-10-CM | POA: Diagnosis not present

## 2021-03-23 DIAGNOSIS — M4624 Osteomyelitis of vertebra, thoracic region: Secondary | ICD-10-CM | POA: Diagnosis not present

## 2021-03-24 ENCOUNTER — Encounter: Payer: Self-pay | Admitting: Internal Medicine

## 2021-03-24 ENCOUNTER — Other Ambulatory Visit: Payer: Self-pay

## 2021-03-24 ENCOUNTER — Ambulatory Visit: Payer: Medicare HMO | Admitting: Internal Medicine

## 2021-03-24 DIAGNOSIS — M4624 Osteomyelitis of vertebra, thoracic region: Secondary | ICD-10-CM

## 2021-03-24 NOTE — Assessment & Plan Note (Signed)
He had a prompt and dramatic response to antibiotic therapy for his streptococcal thoracic vertebral infection.  He recently completed 7 weeks of IV antibiotic therapy.  I am comfortable with observing off of antibiotics for now.  He is in agreement with that plan.  His PICC was removed here in clinic today.  He will follow-up in 4 weeks.

## 2021-03-24 NOTE — Progress Notes (Signed)
Per verbal order from Dr. Jamiyla Ishee Salon, 49 cm PICC removed from right basilic, tip intact. No sutures present. RN confirmed length per chart. Dressing was clean and dry. Insertion site cleaned with CHG. Petroleum dressing applied. Patient advised no heavy lifting with this arm and to leave dressing in place for 24 hours and not to shower affected arm for 24 hours. Advised patient to call office or seek emergency medical care if dressing becomes soaked with blood, swelling, or sharp pain presents. Advised patient to seek emergent care if develops neurological symptoms, chest pain, or shortness of breath. Instructed patient to notify office if they notice redness, warmth, or drainage at the site. Patient verbalized understanding and agreement. RN answered patient's questions. Patient tolerated procedure well and RN walked patient to check out. RN notified Jeani Hawking at [facility] of removal.    Beryle Flock, RN

## 2021-03-24 NOTE — Progress Notes (Signed)
[facility] for Infectious Disease  Patient Active Problem List   Diagnosis Date Noted   Acute osteomyelitis of thoracic spine ([place]) 01/27/2021    Priority: High   CAD (coronary artery disease) 01/28/2021   Transverse myelitis ([place]) 01/28/2021   Peripheral arterial disease ([place]) 25/42/7062   Chronic systolic heart failure ([place]) 03/22/2018   S/P CABG x 4 03/16/2018   Rhegmatogenous retinal detachment of right eye 05/02/2014   Macular hole, right eye 03/05/2014   Macular hole of left eye 12/23/2011    Patient's Medications  New Prescriptions   No medications on file  Previous Medications   ASPIRIN EC 325 MG EC TABLET    Take 1 tablet (325 mg total) by mouth daily.   ATORVASTATIN (LIPITOR) 40 MG TABLET    Take 1 tablet (40 mg total) by mouth at bedtime.   CARVEDILOL (COREG) 25 MG TABLET    TAKE 1 TABLET(25 MG) BY MOUTH TWICE DAILY   ENTRESTO 97-103 MG    TAKE 1 TABLET BY MOUTH TWICE DAILY   FEXOFENADINE HCL (MUCINEX ALLERGY PO)    Take by mouth daily.   HYDROCODONE-ACETAMINOPHEN (NORCO) 10-325 MG TABLET    hydrocodone 10 mg-acetaminophen 325 mg tablet  Take 1 tablet 3 times a day by oral route as needed for pain for 5 days.   ICOSAPENT ETHYL (VASCEPA) 1 G CAPSULE    Take 2 capsules (2 g total) by mouth 2 (two) times daily.   LORATADINE (CLARITIN) 10 MG TABLET    Take 10 mg by mouth daily.   POLYETHYL GLYCOL-PROPYL GLYCOL 0.4-0.3 % SOLN    Place 1 drop into both eyes 2 (two) times daily.   TIZANIDINE (ZANAFLEX) 4 MG CAPSULE    Take 1 capsule (4 mg total) by mouth 3 (three) times daily as needed for muscle spasms.  Modified Medications   No medications on file  Discontinued Medications   CEFTRIAXONE (ROCEPHIN) 1 G INJECTION    Inject 2 g into the muscle daily.    Subjective: Robert [name] is in with his wife for his routine follow-up visit.  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  He received a few doses  of IV vancomycin before switching to IV ceftriaxone.  He recently completed 50 days of total IV antibiotic therapy.  His PICC remains in.  He has had no problems tolerating his PICC or antibiotics.  His pain has improved markedly.  Unfortunately he has a bad, chronic cough that causes him to have pain but it is nowhere near as severe as it was before he started antibiotic therapy.  When he is coughing he has no pain whatsoever.  He has been able to start resuming his usual activities.  He took half a pain pill several nights ago but says that was primarily to help him fall asleep rather than because he was having pain.  Review of Systems: Review of Systems  Constitutional:  Negative for fever.  Respiratory:  Positive for cough.   Cardiovascular:  Negative for chest pain.  Gastrointestinal:  Negative for abdominal pain, diarrhea, nausea and vomiting.  Musculoskeletal:  Positive for back pain.   Past Medical History:  Diagnosis Date   CHF (congestive heart failure) (HCC)    Childhood asthma    Coronary artery disease    Hay fever    Hypertension    Macular hole of left eye 12/23/2011   Macular hole, right  eye 03/05/2014   Myocardial infarction [facility])    June 2019   Neuromuscular disorder (HCC)    numbness in feet   PONV (postoperative nausea and vomiting)    Presence of external cardiac defibrillator 2019   Rhegmatogenous retinal detachment of right eye 05/02/2014   Sinus headache    "hay fever season; not that often"   Transverse myelitis ([place]) 2009   was treated by Dr Erling Cruz   Umbilical hernia     Social History   Tobacco Use   Smoking status: Former    Packs/day: 2.00    Years: 52.00    Pack years: 104.00    Types: Cigarettes    Quit date: 12/18/2017    Years since quitting: 3.2   Smokeless tobacco: Never  Vaping Use   Vaping Use: Never used  Substance Use Topics   Alcohol use: No   Drug use: No    Family History  Problem Relation Age of Onset   Heart disease Mother     Cancer Mother    Heart attack Father     Allergies  Allergen Reactions   Fluorescein Hives and Itching   Inspra [Eplerenone] Other (See Comments)    gynecomastia    Typhoid Vaccines Swelling   Yellow Dye Hives and Itching    Dye in eye test   Codeine Other (See Comments)    Puts me to sleep    Objective: Vitals:   03/24/21 1521  BP: (!) 163/67  Pulse: 69  Temp: 98.1 F (36.7 C)  TempSrc: Oral  SpO2: 94%  Weight: 197 lb (89.4 kg)   Body mass index is 27.87 kg/m.  Physical Exam Constitutional:      Comments: He appears to be much improved over previous visits.  Cardiovascular:     Rate and Rhythm: Normal rate and regular rhythm.     Heart sounds: No murmur heard. Pulmonary:     Effort: Pulmonary effort is normal.     Breath sounds: Normal breath sounds.  Musculoskeletal:     Comments: No kyphotic deformity noted over the thoracic spine.  Skin:    Comments: Right arm PICC site looks good.  Neurological:     General: No focal deficit present.     Gait: Gait normal.  Psychiatric:        Mood and Affect: Mood normal.    Lab Results 02/02/2021 Sed rate 63 C-reactive protein 64  02/09/2021 Sed rate 57 C-reactive protein 6  03/16/2021 Sed rate 37 C-reactive protein 3   Problem List Items Addressed This Visit       High   Acute osteomyelitis of thoracic spine ([place])    He had a prompt and dramatic response to antibiotic therapy for his streptococcal thoracic vertebral infection.  He recently completed 7 weeks of IV antibiotic therapy.  I am comfortable with observing off of antibiotics for now.  He is in agreement with that plan.  His PICC was removed here in clinic today.  He will follow-up in 4 weeks.       Michel Bickers, MD [facility] for [place] [facility] [phone] pager   [phone] cell 03/24/2021, 3:54 PM

## 2021-03-25 ENCOUNTER — Encounter (HOSPITAL_COMMUNITY): Payer: Self-pay | Admitting: Cardiology

## 2021-03-25 ENCOUNTER — Ambulatory Visit (HOSPITAL_COMMUNITY)
Admission: RE | Admit: 2021-03-25 | Discharge: 2021-03-25 | Disposition: A | Discharge status: Home / Self Care | Payer: Medicare HMO | Source: Ambulatory Visit | Attending: Cardiology | Admitting: Cardiology

## 2021-03-25 VITALS — BP 130/70 | HR 74 | Wt 197.8 lb

## 2021-03-25 DIAGNOSIS — I739 Peripheral vascular disease, unspecified: Secondary | ICD-10-CM | POA: Insufficient documentation

## 2021-03-25 DIAGNOSIS — Z951 Presence of aortocoronary bypass graft: Secondary | ICD-10-CM | POA: Insufficient documentation

## 2021-03-25 DIAGNOSIS — Z87891 Personal history of nicotine dependence: Secondary | ICD-10-CM | POA: Insufficient documentation

## 2021-03-25 DIAGNOSIS — Z7982 Long term (current) use of aspirin: Secondary | ICD-10-CM | POA: Insufficient documentation

## 2021-03-25 DIAGNOSIS — Z8249 Family history of ischemic heart disease and other diseases of the circulatory system: Secondary | ICD-10-CM | POA: Diagnosis not present

## 2021-03-25 DIAGNOSIS — I255 Ischemic cardiomyopathy: Secondary | ICD-10-CM | POA: Insufficient documentation

## 2021-03-25 DIAGNOSIS — R3915 Urgency of urination: Secondary | ICD-10-CM | POA: Insufficient documentation

## 2021-03-25 DIAGNOSIS — Z79899 Other long term (current) drug therapy: Secondary | ICD-10-CM | POA: Diagnosis not present

## 2021-03-25 DIAGNOSIS — J449 Chronic obstructive pulmonary disease, unspecified: Secondary | ICD-10-CM | POA: Insufficient documentation

## 2021-03-25 DIAGNOSIS — I5022 Chronic systolic (congestive) heart failure: Secondary | ICD-10-CM | POA: Insufficient documentation

## 2021-03-25 DIAGNOSIS — E785 Hyperlipidemia, unspecified: Secondary | ICD-10-CM | POA: Diagnosis not present

## 2021-03-25 DIAGNOSIS — I2510 Atherosclerotic heart disease of native coronary artery without angina pectoris: Secondary | ICD-10-CM | POA: Insufficient documentation

## 2021-03-25 DIAGNOSIS — N62 Hypertrophy of breast: Secondary | ICD-10-CM | POA: Diagnosis not present

## 2021-03-25 LAB — BASIC METABOLIC PANEL
Anion gap: 8 (ref 5–15)
BUN: 11 mg/dL (ref 8–23)
CO2: 27 mmol/L (ref 22–32)
Calcium: 8.7 mg/dL — ABNORMAL LOW (ref 8.9–10.3)
Chloride: 101 mmol/L (ref 98–111)
Creatinine, Ser: 0.92 mg/dL (ref 0.61–1.24)
GFR, Estimated: >60 mL/min (ref >60)
Glucose, Bld: 75 mg/dL (ref 70–99)
Potassium: 4.5 mmol/L (ref 3.5–5.1)
Sodium: 136 mmol/L (ref 135–145)

## 2021-03-25 NOTE — Patient Instructions (Signed)
EKG done today.  Labs done today. We will contact you only if your labs are abnormal.  No medication changes were made. Please continue all current medications as prescribed.  Your physician recommends that you schedule a follow-up appointment in: 4 months please call our office in December to schedule a January appointment.   If you have any questions or concerns before your next appointment please send [place] a message through [place] or call our office at [phone].    TO LEAVE A MESSAGE FOR THE NURSE SELECT OPTION 2, PLEASE LEAVE A MESSAGE INCLUDING: YOUR NAME DATE OF BIRTH CALL BACK NUMBER REASON FOR CALL**this is important as we prioritize the call backs  YOU WILL RECEIVE A CALL BACK THE SAME DAY AS LONG AS YOU CALL BEFORE 4:00 PM   Do the following things EVERYDAY: Weigh yourself in the morning before breakfast. Write it down and keep it in a log. Take your medicines as prescribed Eat low salt foods--Limit salt (sodium) to 2000 mg per day.  Stay as active as you can everyday Limit all fluids for the day to less than 2 liters   At the [facility], you and your health needs are our priority. As part of our continuing mission to provide you with exceptional heart care, we have created designated Provider Care Teams. These Care Teams include your primary Cardiologist (physician) and Advanced Practice Providers (APPs- Physician Assistants and Nurse Practitioners) who all work together to provide you with the care you need, when you need it.   You may see any of the following providers on your designated Care Team at your next follow up: Dr Glori Bickers Dr Haynes Kerns, NP Lyda Jester, [place] Audry Riles, PharmD   Please be sure to bring in all your medications bottles to every appointment.

## 2021-03-26 NOTE — Progress Notes (Signed)
[facility] Cardiology: Dr. Aundra Dubin PCP: Dr. Forde Dandy  80 y.o. with history of CAD and ischemic cardiomyopathy presents for followup of CHF. Patient had no cardiac history prior to 6/19. However, since around 12/18, he had noted exertional dyspnea.  In 6[place]19, [place]  He developed very severe dyspnea and went to the hospital in [place] where he was found to have NSTEMI.  LHC was done, showing chronic occlusions of the LAD and RCA with collaterals.  Echo sh//owed EF 25-30%.  No intervention, he was eventually discharged to come home.  He has been seen by Dr. Prescott Gum for CABG evaluation.  Cardiac MRI in 6/19 showed EF 28% with substantial viability.  RHC was done in 7/19, showing mildly elevated filling pressures but low cardiac output, with CI 1.7.  After RHC, I increased his Lasix and added digoxin.    Of note, he has quit smoking since 6/19.   He was admitted for CABG in 9/19.  Volume overload post-op, treated with diuresis.    Echo in 12/19 showed EF 40%, mid anteroseptal and mid inferoseptal akinesis.   He had painful gynecomastia with spironolactone and switched to eplerenone but did not resolve.  Now off eplerenone with resolution of symptoms.   Echo in 3/21 showed EF 40-45%, mild LVH, normal RV.   ABIs in 3/21 showed moderate PAD, he saw Dr. Alvester Chou with plan for conservative management for now.   He had COVID-19 in 2/22.   Echo in 5/22 showed EF up to 45-50%, noraml RV.  ABIs in 5/22 with unchanged mild disease on right and moderate on left.   Spinal osteomyelitis in 8/22 with Strep mitis.   He returns for followup of CHF.  His back feels better since treatment for osteomyelitis.  No chest pain.  He has had a chronic cough x years, unchanged.  Dyspnea only with "hurrying."  Weight down 8 lbs. No lightheadedness.  No orthopnea/PND.    ECG (personally reviewed): NSR, PVC  Labs (7/19): hgb 14.1, K 4.1, creatinine 1.26, LDL 28 Labs (9/19): K 3.7, creatinine 1.09,  LFTs normal, hgb 12.6 Labs (11/19): K 4.4, creatinine 1.31 Labs (12/19): K 4.4, creatinine 1.19 Labs (3/20): K 4.5, creatinine 1.12 Labs (5/20): LDL 41, HDL 27, TGs 176  Labs (8/20): K 4.9, creatinine 1.12 Labs (3/21): K 4.5, creatinine 1.14 Labs (6/21): K 4.2, creatinine 1.13, LDL 35, TGs 142 Labs (9/21): K 4.3, creatinine 1.12 Labs (2/22): BNP 301, K 4.2, creatinine 1.15 Labs (5/22): TGs 75, LDL 26 Labs (8/22): K 3.7,creatinine 0.8  PMH: 1. H/o retinal detachment 2. H/o transverse myelitis (remote) 3. CAD: NSTEMI 6/19 in [place]. - LHC (6/19): Chronic total occlusion of LAD and chronic total occlusion of RCA with collaterals.  - CABG (9/19): LIMA-LAD, SVG-D, SVG-ramus, SVG-PDA.  4. Chronic systolic CHF: Ischemic cardiomyopathy.   - Echo (6/19): LV moderately dilated, EF 25-30%, mild MR.  - Cardiac MRI (6/19): EF 28%, significant viability noted.  - RHC (7/19): mean RA 8, PA 40/16 mean 26, mean PCWP 23, CI 1.7, PVR 0.83 WU - Echo (12/19): EF 40%, mid inferoseptal and mid anteroseptal akinesis, inferior hypokinesis, normal RV size and systolic function.  - Painful gynecomastia with spironolactone and eplerenone. - Echo (3/21): EF 40-45%, mild LVH, normal RV  - Echo (5/22): EF 45-50%, normal RV.  5. COPD: PFTs (9/19) with moderate obstruction.  6. PAD: ABIs (3/21) with 0.59 on right, 0.8 on left.  - ABIs (5/22): stable mild disease on right  and moderate on left.  7. Spinal osteomyelitis: Streptococcus mitis, 8/22  SH: Married, lives in [place].  He quit smoking in 6/19.    Family History  Problem Relation Age of Onset   Heart disease Mother    Cancer Mother    Heart attack Father    ROS: All systems reviewed and negative except as per HPI.   Current Outpatient Medications  Medication Sig Dispense Refill   aspirin EC 325 MG EC tablet Take 1 tablet (325 mg total) by mouth daily. 30 tablet 0   atorvastatin (LIPITOR) 40 MG tablet Take 1 tablet (40 mg total) by mouth at  bedtime. 90 tablet 3   carvedilol (COREG) 25 MG tablet TAKE 1 TABLET(25 MG) BY MOUTH TWICE DAILY 60 tablet 6   ENTRESTO 97-103 MG TAKE 1 TABLET BY MOUTH TWICE DAILY 60 tablet 5   Fexofenadine HCl (MUCINEX ALLERGY PO) Take by mouth daily.     HYDROcodone-acetaminophen (NORCO) 10-325 MG tablet hydrocodone 10 mg-acetaminophen 325 mg tablet  Take 1 tablet 3 times a day by oral route as needed for pain for 5 days.     icosapent Ethyl (VASCEPA) 1 g capsule Take 2 capsules (2 g total) by mouth 2 (two) times daily. 360 capsule 3   loratadine (CLARITIN) 10 MG tablet Take 10 mg by mouth daily.     Polyethyl Glycol-Propyl Glycol 0.4-0.3 % SOLN Place 1 drop into both eyes 2 (two) times daily.     No current facility-administered medications for this encounter.   BP 130/70   Pulse 74   Wt 89.7 kg (197 lb 12.8 oz)   SpO2 95%   BMI 27.98 kg/m  General: NAD Neck: No JVD, no thyromegaly or thyroid nodule.  Lungs: Decreased BS with rhonchi CV: Nondisplaced PMI.  Heart regular S1/S2, no S3/S4, no murmur.  No peripheral edema.  No carotid bruit.  Normal pedal pulses.  Abdomen: Soft, nontender, no hepatosplenomegaly, no distention.  Skin: Intact without lesions or rashes.  Neurologic: Alert and oriented x 3.  Psych: Normal affect. Extremities: No clubbing or cyanosis.  HEENT: Normal.   Assessment/Plan: 1. CAD: LHC in 6/19 with CTOs of LAD and RCA.  Cardiac MRI showed significant myocardial viability. He is s/p CABG x 4 in 9/19.  No chest pain. - Continue ASA 81 and statin.  2. Chronic systolic CHF: Ischemic cardiomyopathy.  Echo in 6/19 with EF 25-20%.  Cardiac MRI in 7/19 with EF 28%, significant viability noted. RHC in 7/19 showed mildly elevated filling pressure and low cardiac output. Now s/p CABG in 9/19.  Echo post-op in 12/19 showed EF up to 40%.  3/21 echo with EF up to 40-45%, and echo in 5/22 with EF 45-50%.  NYHA class II symptoms and not volume overloaded on exam.  Painful gynecomastia with  spironolactone and eplerenone. Did not tolerate Farxiga due to urinary urgency.  - Continue Entresto to 97/103 bid.   BMET today.  - Continue Coreg 25 mg bid.  - He does not want to retry Farxiga due to significant urinary symptoms.  - EF is out of range of ICD.  3. COPD: He has quit smoking since 6/19.  4. Hyperlipidemia: Continue atorvastatin and Vascepa, good lipids in 5/22.    5. PAD: Minimal claudication.  He saw Dr. Gwenlyn Found, plan for medical management for now.    Followup in 4 months  [name] 03/26/2021

## 2021-04-21 ENCOUNTER — Other Ambulatory Visit: Payer: Self-pay

## 2021-04-21 ENCOUNTER — Ambulatory Visit (INDEPENDENT_AMBULATORY_CARE_PROVIDER_SITE_OTHER): Payer: Medicare HMO | Admitting: Internal Medicine

## 2021-04-21 DIAGNOSIS — M4624 Osteomyelitis of vertebra, thoracic region: Secondary | ICD-10-CM | POA: Diagnosis not present

## 2021-04-21 NOTE — Progress Notes (Signed)
Virtual Visit via Telephone Note  I connected with Robert [name] on 04/21/21 at  1:45 PM EDT by telephone and verified that I am speaking with the correct person using two identifiers.  Location: Patient: Home Provider: [facility]   I discussed the limitations, risks, security and privacy concerns of performing an evaluation and management service by telephone and the availability of in person appointments. I also discussed with the patient that there may be a patient responsible charge related to this service. The patient expressed understanding and agreed to proceed.   History of Present Illness: I called and spoke with [facility] today.  He completed 50 days of IV ceftriaxone on 03/24/2021 for his Streptococcus mitis thoracic infection at the T7-8 level.  He continues to improve.  He is not having any back pain currently.  He is now able to sleep in his bed again.  He is back at work.  He drives 20 miles to and from work and is able to work 4 to 6 hours each day.   Observations/Objective:   Assessment and Plan: I believe that his infection has been cured.  Follow Up Instructions: Follow-up here as needed   I discussed the assessment and treatment plan with the patient. The patient was provided an opportunity to ask questions and all were answered. The patient agreed with the plan and demonstrated an understanding of the instructions.   The patient was advised to call back or seek an in-person evaluation if the symptoms worsen or if the condition fails to improve as anticipated.  I provided 15 minutes of non-face-to-face time during this encounter.   Michel Bickers, MD

## 2021-04-22 ENCOUNTER — Encounter (INDEPENDENT_AMBULATORY_CARE_PROVIDER_SITE_OTHER): Payer: Medicare HMO | Admitting: Ophthalmology

## 2021-04-22 ENCOUNTER — Other Ambulatory Visit: Payer: Self-pay

## 2021-04-22 DIAGNOSIS — H35343 Macular cyst, hole, or pseudohole, bilateral: Secondary | ICD-10-CM | POA: Diagnosis not present

## 2021-04-22 DIAGNOSIS — H338 Other retinal detachments: Secondary | ICD-10-CM

## 2021-06-02 ENCOUNTER — Telehealth (HOSPITAL_COMMUNITY): Payer: Self-pay

## 2021-06-02 ENCOUNTER — Other Ambulatory Visit (HOSPITAL_COMMUNITY): Payer: Self-pay | Admitting: *Deleted

## 2021-06-02 DIAGNOSIS — H35373 Puckering of macula, bilateral: Secondary | ICD-10-CM | POA: Diagnosis not present

## 2021-06-02 DIAGNOSIS — H26491 Other secondary cataract, right eye: Secondary | ICD-10-CM | POA: Diagnosis not present

## 2021-06-02 DIAGNOSIS — H35343 Macular cyst, hole, or pseudohole, bilateral: Secondary | ICD-10-CM | POA: Diagnosis not present

## 2021-06-02 MED ORDER — ICOSAPENT ETHYL 1 G PO CAPS: 2.0000 g | ORAL_CAPSULE | Freq: Two times a day (BID) | ORAL | 3 refills | Status: DC

## 2021-06-02 NOTE — Telephone Encounter (Signed)
Patient would like a refill on Vascepa.

## 2021-06-02 NOTE — Telephone Encounter (Signed)
Refill sent.

## 2021-06-04 DIAGNOSIS — I255 Ischemic cardiomyopathy: Secondary | ICD-10-CM | POA: Diagnosis not present

## 2021-06-04 DIAGNOSIS — I739 Peripheral vascular disease, unspecified: Secondary | ICD-10-CM | POA: Diagnosis not present

## 2021-06-04 DIAGNOSIS — E1151 Type 2 diabetes mellitus with diabetic peripheral angiopathy without gangrene: Secondary | ICD-10-CM | POA: Diagnosis not present

## 2021-06-04 DIAGNOSIS — I700 Atherosclerosis of aorta: Secondary | ICD-10-CM | POA: Diagnosis not present

## 2021-06-04 DIAGNOSIS — I5022 Chronic systolic (congestive) heart failure: Secondary | ICD-10-CM | POA: Diagnosis not present

## 2021-06-04 DIAGNOSIS — I25810 Atherosclerosis of coronary artery bypass graft(s) without angina pectoris: Secondary | ICD-10-CM | POA: Diagnosis not present

## 2021-06-04 DIAGNOSIS — I130 Hypertensive heart and chronic kidney disease with heart failure and stage 1 through stage 4 chronic kidney disease, or unspecified chronic kidney disease: Secondary | ICD-10-CM | POA: Diagnosis not present

## 2021-06-04 DIAGNOSIS — J449 Chronic obstructive pulmonary disease, unspecified: Secondary | ICD-10-CM | POA: Diagnosis not present

## 2021-06-04 DIAGNOSIS — N1831 Chronic kidney disease, stage 3a: Secondary | ICD-10-CM | POA: Diagnosis not present

## 2021-06-04 DIAGNOSIS — H524 Presbyopia: Secondary | ICD-10-CM | POA: Diagnosis not present

## 2021-06-06 ENCOUNTER — Other Ambulatory Visit (HOSPITAL_COMMUNITY): Payer: Self-pay | Admitting: Cardiology

## 2021-06-08 ENCOUNTER — Other Ambulatory Visit (HOSPITAL_COMMUNITY): Payer: Self-pay | Admitting: Cardiology

## 2021-06-23 DIAGNOSIS — R972 Elevated prostate specific antigen [PSA]: Secondary | ICD-10-CM | POA: Diagnosis not present

## 2021-07-02 ENCOUNTER — Other Ambulatory Visit (HOSPITAL_COMMUNITY): Payer: Self-pay

## 2021-07-02 MED ORDER — ATORVASTATIN CALCIUM 40 MG PO TABS: 40.0000 mg | ORAL_TABLET | Freq: Every day | ORAL | 3 refills | Status: DC

## 2021-07-10 DIAGNOSIS — R5383 Other fatigue: Secondary | ICD-10-CM | POA: Diagnosis not present

## 2021-07-10 DIAGNOSIS — R059 Cough, unspecified: Secondary | ICD-10-CM | POA: Diagnosis not present

## 2021-07-10 DIAGNOSIS — I5022 Chronic systolic (congestive) heart failure: Secondary | ICD-10-CM | POA: Diagnosis not present

## 2021-07-10 DIAGNOSIS — J3489 Other specified disorders of nose and nasal sinuses: Secondary | ICD-10-CM | POA: Diagnosis not present

## 2021-07-10 DIAGNOSIS — J449 Chronic obstructive pulmonary disease, unspecified: Secondary | ICD-10-CM | POA: Diagnosis not present

## 2021-07-10 DIAGNOSIS — Z1152 Encounter for screening for COVID-19: Secondary | ICD-10-CM | POA: Diagnosis not present

## 2021-07-10 DIAGNOSIS — J019 Acute sinusitis, unspecified: Secondary | ICD-10-CM | POA: Diagnosis not present

## 2021-07-10 DIAGNOSIS — E1151 Type 2 diabetes mellitus with diabetic peripheral angiopathy without gangrene: Secondary | ICD-10-CM | POA: Diagnosis not present

## 2021-07-13 ENCOUNTER — Other Ambulatory Visit: Payer: Self-pay

## 2021-07-13 MED ORDER — CARVEDILOL 25 MG PO TABS: ORAL_TABLET | ORAL | 8 refills | Status: DC

## 2021-08-03 ENCOUNTER — Ambulatory Visit (HOSPITAL_COMMUNITY)
Admission: RE | Admit: 2021-08-03 | Discharge: 2021-08-03 | Disposition: A | Discharge status: Home / Self Care | Payer: Medicare HMO | Source: Ambulatory Visit | Attending: Cardiology | Admitting: Cardiology

## 2021-08-03 ENCOUNTER — Encounter (HOSPITAL_COMMUNITY): Payer: Self-pay | Admitting: Cardiology

## 2021-08-03 ENCOUNTER — Other Ambulatory Visit: Payer: Self-pay

## 2021-08-03 VITALS — BP 138/80 | HR 66 | Wt 219.2 lb

## 2021-08-03 DIAGNOSIS — Z951 Presence of aortocoronary bypass graft: Secondary | ICD-10-CM | POA: Insufficient documentation

## 2021-08-03 DIAGNOSIS — R0600 Dyspnea, unspecified: Secondary | ICD-10-CM | POA: Diagnosis not present

## 2021-08-03 DIAGNOSIS — Z79899 Other long term (current) drug therapy: Secondary | ICD-10-CM | POA: Diagnosis not present

## 2021-08-03 DIAGNOSIS — J449 Chronic obstructive pulmonary disease, unspecified: Secondary | ICD-10-CM | POA: Diagnosis not present

## 2021-08-03 DIAGNOSIS — Z09 Encounter for follow-up examination after completed treatment for conditions other than malignant neoplasm: Secondary | ICD-10-CM | POA: Insufficient documentation

## 2021-08-03 DIAGNOSIS — I255 Ischemic cardiomyopathy: Secondary | ICD-10-CM | POA: Insufficient documentation

## 2021-08-03 DIAGNOSIS — Z87891 Personal history of nicotine dependence: Secondary | ICD-10-CM | POA: Insufficient documentation

## 2021-08-03 DIAGNOSIS — E785 Hyperlipidemia, unspecified: Secondary | ICD-10-CM | POA: Insufficient documentation

## 2021-08-03 DIAGNOSIS — I5022 Chronic systolic (congestive) heart failure: Secondary | ICD-10-CM | POA: Diagnosis not present

## 2021-08-03 DIAGNOSIS — I2510 Atherosclerotic heart disease of native coronary artery without angina pectoris: Secondary | ICD-10-CM | POA: Insufficient documentation

## 2021-08-03 DIAGNOSIS — Z7901 Long term (current) use of anticoagulants: Secondary | ICD-10-CM | POA: Diagnosis not present

## 2021-08-03 DIAGNOSIS — I252 Old myocardial infarction: Secondary | ICD-10-CM | POA: Diagnosis not present

## 2021-08-03 DIAGNOSIS — Z8616 Personal history of COVID-19: Secondary | ICD-10-CM | POA: Diagnosis not present

## 2021-08-03 LAB — BASIC METABOLIC PANEL
Anion gap: 9 (ref 5–15)
BUN: 15 mg/dL (ref 8–23)
CO2: 30 mmol/L (ref 22–32)
Calcium: 8.7 mg/dL — ABNORMAL LOW (ref 8.9–10.3)
Chloride: 102 mmol/L (ref 98–111)
Creatinine, Ser: 1.11 mg/dL (ref 0.61–1.24)
GFR, Estimated: >60 mL/min (ref >60)
Glucose, Bld: 86 mg/dL (ref 70–99)
Potassium: 4.8 mmol/L (ref 3.5–5.1)
Sodium: 141 mmol/L (ref 135–145)

## 2021-08-03 LAB — LIPID PANEL
Cholesterol: 85 mg/dL (ref 0–200)
HDL: 37 mg/dL — ABNORMAL LOW (ref >40)
LDL Cholesterol: 36 mg/dL (ref 0–99)
Total CHOL/HDL Ratio: 2.3 RATIO
Triglycerides: 61 mg/dL (ref <150)
VLDL: 12 mg/dL (ref 0–40)

## 2021-08-03 LAB — BRAIN NATRIURETIC PEPTIDE: B Natriuretic Peptide: 144.7 pg/mL — ABNORMAL HIGH (ref 0.0–100.0)

## 2021-08-03 MED ORDER — FUROSEMIDE 20 MG PO TABS: 20.0000 mg | ORAL_TABLET | Freq: Every day | ORAL | 3 refills | Status: DC

## 2021-08-03 MED ORDER — POTASSIUM CHLORIDE ER 10 MEQ PO TBCR: 10.0000 meq | EXTENDED_RELEASE_TABLET | Freq: Every day | ORAL | 3 refills | Status: DC

## 2021-08-03 MED ORDER — ASPIRIN EC 81 MG PO TBEC: 81.0000 mg | DELAYED_RELEASE_TABLET | Freq: Every day | ORAL | 3 refills | Status: DC

## 2021-08-03 NOTE — Progress Notes (Signed)
[facility] Cardiology: Dr. Aundra Dubin PCP: Dr. Forde Dandy  81 y.o. with history of CAD and ischemic cardiomyopathy presents for followup of CHF. Patient had no cardiac history prior to 6/19. However, since around 12/18, he had noted exertional dyspnea.  In 6/19, [place].  He developed very severe dyspnea and went to the hospital in [place] where he was found to have NSTEMI.  LHC was done, showing chronic occlusions of the LAD and RCA with collaterals.  Echo sh//owed EF 25-30%.  No intervention, he was eventually discharged to come home.  He has been seen by Dr. Prescott Gum for CABG evaluation.  Cardiac MRI in 6/19 showed EF 28% with substantial viability.  RHC was done in 7/19, showing mildly elevated filling pressures but low cardiac output, with CI 1.7.  After RHC, I increased his Lasix and added digoxin.    Of note, he has quit smoking since 6/19.   He was admitted for CABG in 9/19.  Volume overload post-op, treated with diuresis.    Echo in 12/19 showed EF 40%, mid anteroseptal and mid inferoseptal akinesis.   He had painful gynecomastia with spironolactone and switched to eplerenone but did not resolve.  Now off eplerenone with resolution of symptoms.   Echo in 3/21 showed EF 40-45%, mild LVH, normal RV.   ABIs in 3/21 showed moderate PAD, he saw Dr. Alvester Chou with plan for conservative management for now.   He had COVID-19 in 2/22.   Echo in 5/22 showed EF up to 45-50%, normal RV.  ABIs in 5/22 with unchanged mild disease on right and moderate on left.   Spinal osteomyelitis in 8/22 with Strep mitis.   He returns for followup of CHF.  His back is doing better.  He is now working a couple of days/week.  No chest pain. Weight is up about 20 lbs.  He notes dyspnea carrying wood into his house.  No dyspnea walking on flat ground.  No lightheadedness.  He sleeps in a recliner because of his back pain, not orthopnea.      Labs (7/19): hgb 14.1, K 4.1, creatinine 1.26, LDL  28 Labs (9/19): K 3.7, creatinine 1.09, LFTs normal, hgb 12.6 Labs (11/19): K 4.4, creatinine 1.31 Labs (12/19): K 4.4, creatinine 1.19 Labs (3/20): K 4.5, creatinine 1.12 Labs (5/20): LDL 41, HDL 27, TGs 176  Labs (8/20): K 4.9, creatinine 1.12 Labs (3/21): K 4.5, creatinine 1.14 Labs (6/21): K 4.2, creatinine 1.13, LDL 35, TGs 142 Labs (9/21): K 4.3, creatinine 1.12 Labs (2/22): BNP 301, K 4.2, creatinine 1.15 Labs (5/22): TGs 75, LDL 26 Labs (8/22): K 3.7,creatinine 0.8 Labs (9/22): K 4.5, creatinine 0.92  PMH: 1. H/o retinal detachment 2. H/o transverse myelitis (remote) 3. CAD: NSTEMI 6/19 in [place]. - LHC (6/19): Chronic total occlusion of LAD and chronic total occlusion of RCA with collaterals.  - CABG (9/19): LIMA-LAD, SVG-D, SVG-ramus, SVG-PDA.  4. Chronic systolic CHF: Ischemic cardiomyopathy.   - Echo (6/19): LV moderately dilated, EF 25-30%, mild MR.  - Cardiac MRI (6/19): EF 28%, significant viability noted.  - RHC (7/19): mean RA 8, PA 40/16 mean 26, mean PCWP 23, CI 1.7, PVR 0.83 WU - Echo (12/19): EF 40%, mid inferoseptal and mid anteroseptal akinesis, inferior hypokinesis, normal RV size and systolic function.  - Painful gynecomastia with spironolactone and eplerenone. - Echo (3/21): EF 40-45%, mild LVH, normal RV  - Echo (5/22): EF 45-50%, normal RV.  5. COPD: PFTs (9/19) with moderate obstruction.  6. PAD: ABIs (3/21) with 0.59 on right, 0.8 on left.  - ABIs (5/22): stable mild disease on right and moderate on left.  7. Spinal osteomyelitis: Streptococcus mitis, 8/22  SH: Married, lives in [place].  He quit smoking in 6/19.    Family History  Problem Relation Age of Onset   Heart disease Mother    Cancer Mother    Heart attack Father    ROS: All systems reviewed and negative except as per HPI.   Current Outpatient Medications  Medication Sig Dispense Refill   aspirin EC 81 MG tablet Take 1 tablet (81 mg total) by mouth daily. Swallow whole. 90  tablet 3   atorvastatin (LIPITOR) 40 MG tablet Take 1 tablet (40 mg total) by mouth at bedtime. 90 tablet 3   carvedilol (COREG) 25 MG tablet TAKE 1 TABLET(25 MG) BY MOUTH TWICE DAILY 60 tablet 8   ENTRESTO 97-103 MG TAKE 1 TABLET BY MOUTH TWICE DAILY 60 tablet 5   furosemide (LASIX) 20 MG tablet Take 1 tablet (20 mg total) by mouth daily. 90 tablet 3   icosapent Ethyl (VASCEPA) 1 g capsule Take 2 capsules (2 g total) by mouth 2 (two) times daily. 360 capsule 3   Polyethyl Glycol-Propyl Glycol 0.4-0.3 % SOLN Place 1 drop into both eyes 2 (two) times daily.     potassium chloride (KLOR-CON) 10 MEQ tablet Take 1 tablet (10 mEq total) by mouth daily. 90 tablet 3   No current facility-administered medications for this encounter.   BP 138/80    Pulse 66    Wt 99.4 kg (219 lb 3.2 oz)    SpO2 96%    BMI 31.01 kg/m  General: NAD Neck: JVP 8-9 cm, no thyromegaly or thyroid nodule.  Lungs: Clear to auscultation bilaterally with normal respiratory effort. CV: Nondisplaced PMI.  Heart regular S1/S2, no S3/S4, no murmur.  1+ ankle edema.  No carotid bruit.  Unable to palpate pedal pulses.  Abdomen: Soft, nontender, no hepatosplenomegaly, no distention.  Skin: Intact without lesions or rashes.  Neurologic: Alert and oriented x 3.  Psych: Normal affect. Extremities: No clubbing or cyanosis.  HEENT: Normal.   Assessment/Plan: 1. CAD: LHC in 6/19 with CTOs of LAD and RCA.  Cardiac MRI showed significant myocardial viability. He is s/p CABG x 4 in 9/19.  No chest pain. - Continue ASA 81 and statin.  2. Chronic systolic CHF: Ischemic cardiomyopathy.  Echo in 6/19 with EF 25-20%.  Cardiac MRI in 7/19 with EF 28%, significant viability noted. RHC in 7/19 showed mildly elevated filling pressure and low cardiac output. Now s/p CABG in 9/19.  Echo post-op in 12/19 showed EF up to 40%.  3/21 echo with EF up to 40-45%, and echo in 5/22 with EF 45-50%.  NYHA class II symptoms but mildly volume overloaded on exam.    - Painful gynecomastia with spironolactone and eplerenone. Did not tolerate Farxiga due to urinary urgency.  - Continue Entresto to 97/103 bid.   BMET today.  - Continue Coreg 25 mg bid.  - Add Lasix 20 mg daily and KCl 10 daily. BMET 10 days.  - EF is out of range of ICD.  3. COPD: He has quit smoking since 6/19.  4. Hyperlipidemia: Continue atorvastatin and Vascepa, check lipids today.    5. PAD: Minimal claudication.  He saw Dr. Gwenlyn Found, plan for medical management for now.    Followup in 1 month APP.   [name] 08/03/2021

## 2021-08-03 NOTE — Patient Instructions (Signed)
START Lasix 20 mg daily  START  Potassium 10 meq daily  DECREASE Aspirin  to 81 mg Daily  Lab work done today we will call you with any abnormal results  Follow up lab work in 10 days  Your physician recommends that you schedule a follow-up appointment in: 1 month with app clinic  If you have any questions or concerns before your next appointment please send [place] a message through [place] or call our office at [phone].    TO LEAVE A MESSAGE FOR THE NURSE SELECT OPTION 2, PLEASE LEAVE A MESSAGE INCLUDING: YOUR NAME DATE OF BIRTH CALL BACK NUMBER REASON FOR CALL**this is important as we prioritize the call backs  YOU WILL RECEIVE A CALL BACK THE SAME DAY AS LONG AS YOU CALL BEFORE 4:00 PM At the [facility], you and your health needs are our priority. As part of our continuing mission to provide you with exceptional heart care, we have created designated Provider Care Teams. These Care Teams include your primary Cardiologist (physician) and Advanced Practice Providers (APPs- Physician Assistants and Nurse Practitioners) who all work together to provide you with the care you need, when you need it.   You may see any of the following providers on your designated Care Team at your next follow up: Dr Glori Bickers Dr Haynes Kerns, NP Lyda Jester, [place] [facility] [place], [place] Audry Riles, PharmD   Please be sure to bring in all your medications bottles to every appointment.   Do the following things EVERYDAY: Weigh yourself in the morning before breakfast. Write it down and keep it in a log. Take your medicines as prescribed Eat low salt foods--Limit salt (sodium) to 2000 mg per day.  Stay as active as you can everyday Limit all fluids for the day to less than 2 liters

## 2021-08-14 ENCOUNTER — Other Ambulatory Visit: Payer: Self-pay

## 2021-08-14 ENCOUNTER — Ambulatory Visit (HOSPITAL_COMMUNITY)
Admission: RE | Admit: 2021-08-14 | Discharge: 2021-08-14 | Disposition: A | Discharge status: Home / Self Care | Payer: Medicare HMO | Source: Ambulatory Visit | Attending: Internal Medicine | Admitting: Internal Medicine

## 2021-08-14 DIAGNOSIS — I5022 Chronic systolic (congestive) heart failure: Secondary | ICD-10-CM | POA: Diagnosis not present

## 2021-08-14 LAB — BASIC METABOLIC PANEL
Anion gap: 8 (ref 5–15)
BUN: 18 mg/dL (ref 8–23)
CO2: 27 mmol/L (ref 22–32)
Calcium: 8.9 mg/dL (ref 8.9–10.3)
Chloride: 102 mmol/L (ref 98–111)
Creatinine, Ser: 1.17 mg/dL (ref 0.61–1.24)
GFR, Estimated: >60 mL/min (ref >60)
Glucose, Bld: 149 mg/dL — ABNORMAL HIGH (ref 70–99)
Potassium: 4.3 mmol/L (ref 3.5–5.1)
Sodium: 137 mmol/L (ref 135–145)

## 2021-09-01 NOTE — Progress Notes (Signed)
HF Cardiology: Dr. Aundra Dubin ?PCP: Dr. Forde Dandy ? ?81 y.o. with history of CAD and ischemic cardiomyopathy presents for followup of CHF. Patient had no cardiac history prior to 6/19. However, since around 12/18, he had noted exertional dyspnea.  In 6/19, he was on vacation in the mountains.  He developed very severe dyspnea and went to the hospital in [place] where he was found to have NSTEMI.  LHC was done, showing chronic occlusions of the LAD and RCA with collaterals.  Echo sh//owed EF 25-30%.  No intervention, he was eventually discharged to come home.  He has been seen by Dr. Prescott Gum for CABG evaluation.  Cardiac MRI in 6/19 showed EF 28% with substantial viability.  RHC was done in 7/19, showing mildly elevated filling pressures but low cardiac output, with CI 1.7.  After RHC, I increased his Lasix and added digoxin.   ? ?Of note, he has quit smoking since 6/19.  ? ?He was admitted for CABG in 9/19.  Volume overload post-op, treated with diuresis.   ? ?Echo in 12/19 showed EF 40%, mid anteroseptal and mid inferoseptal akinesis.  ? ?He had painful gynecomastia with spironolactone and switched to eplerenone but did not resolve.  Now off eplerenone with resolution of symptoms.  ? ?Echo in 3/21 showed EF 40-45%, mild LVH, normal RV.  ? ?ABIs in 3/21 showed moderate PAD, he saw Dr. Alvester Chou with plan for conservative management for now.  ? ?He had COVID-19 in 2/22.  ? ?Echo in 5/22 showed EF up to 45-50%, normal RV.  ABIs in 5/22 with unchanged mild disease on right and moderate on left.  ? ?Spinal osteomyelitis in 8/22 with Strep mitis.  ? ?Follow up 1/23, weight up 20 lbs and more SOB carrying wood. Lasix 20 mg daily started.  ? ?Today he returns for HF follow up. Overall feeling fine. No SOB unloading wood. He will have mild dyspnea if he is in a hurry. Denies abnormal bleeding, palpitations, CP, dizziness, or edema. Chronically sleeps in recliner due to back issues. Appetite ok. No fever or chills. He does not  weigh at home, but notices clothing fits better now after being on Lasix daily. Taking all medications. Now back to using pedal-machine daily. Works part time in [employer]. ? ?ECG (personally reviewed): NSR ? ?ReDs: 26% ? ?Labs (7/19): hgb 14.1, K 4.1, creatinine 1.26, LDL 28 ?Labs (9/19): K 3.7, creatinine 1.09, LFTs normal, hgb 12.6 ?Labs (11/19): K 4.4, creatinine 1.31 ?Labs (12/19): K 4.4, creatinine 1.19 ?Labs (3/20): K 4.5, creatinine 1.12 ?Labs (5/20): LDL 41, HDL 27, TGs 176  ?Labs (8/20): K 4.9, creatinine 1.12 ?Labs (3/21): K 4.5, creatinine 1.14 ?Labs (6/21): K 4.2, creatinine 1.13, LDL 35, TGs 142 ?Labs (9/21): K 4.3, creatinine 1.12 ?Labs (2/22): BNP 301, K 4.2, creatinine 1.15 ?Labs (5/22): TGs 75, LDL 26 ?Labs (8/22): K 3.7,creatinine 0.8 ?Labs (9/22): K 4.5, creatinine 0.92 ?Labs (1/23): K 4.3, creatinine 1.17, LDL 36 ? ?PMH: ?1. H/o retinal detachment ?2. H/o transverse myelitis (remote) ?3. CAD: NSTEMI 6/19 in [place]. ?- LHC (6/19): Chronic total occlusion of LAD and chronic total occlusion of RCA with collaterals.  ?- CABG (9/19): LIMA-LAD, SVG-D, SVG-ramus, SVG-PDA.  ?4. Chronic systolic CHF: Ischemic cardiomyopathy.   ?- Echo (6/19): LV moderately dilated, EF 25-30%, mild MR.  ?- Cardiac MRI (6/19): EF 28%, significant viability noted.  ?- RHC (7/19): mean RA 8, PA 40/16 mean 26, mean PCWP 23, CI 1.7, PVR 0.83 WU ?- Echo (12/19): EF 40%, mid  inferoseptal and mid anteroseptal akinesis, inferior hypokinesis, normal RV size and systolic function.  ?- Painful gynecomastia with spironolactone and eplerenone. ?- Echo (3/21): EF 40-45%, mild LVH, normal RV  ?- Echo (5/22): EF 45-50%, normal RV.  ?5. COPD: PFTs (9/19) with moderate obstruction.  ?6. PAD: ABIs (3/21) with 0.59 on right, 0.8 on left.  ?- ABIs (5/22): stable mild disease on right and moderate on left.  ?7. Spinal osteomyelitis: Streptococcus mitis, 8/22 ? ?SH: Married, lives in [place].  He quit smoking in 6/19.   ? ?Family  History  ?Problem Relation Age of Onset  ? Heart disease Mother   ? Cancer Mother   ? Heart attack Father   ? ?ROS: All systems reviewed and negative except as per HPI.  ? ?Current Outpatient Medications  ?Medication Sig Dispense Refill  ? aspirin EC 81 MG tablet Take 1 tablet (81 mg total) by mouth daily. Swallow whole. 90 tablet 3  ? atorvastatin (LIPITOR) 40 MG tablet Take 1 tablet (40 mg total) by mouth at bedtime. 90 tablet 3  ? carvedilol (COREG) 25 MG tablet TAKE 1 TABLET(25 MG) BY MOUTH TWICE DAILY 60 tablet 8  ? ENTRESTO 97-103 MG TAKE 1 TABLET BY MOUTH TWICE DAILY 60 tablet 5  ? furosemide (LASIX) 20 MG tablet Take 1 tablet (20 mg total) by mouth daily. 90 tablet 3  ? icosapent Ethyl (VASCEPA) 1 g capsule Take 2 capsules (2 g total) by mouth 2 (two) times daily. 360 capsule 3  ? Polyethyl Glycol-Propyl Glycol 0.4-0.3 % SOLN Place 1 drop into both eyes 2 (two) times daily.    ? potassium chloride (KLOR-CON) 10 MEQ tablet Take 1 tablet (10 mEq total) by mouth daily. 90 tablet 3  ? ?No current facility-administered medications for this encounter.  ? ?Wt Readings from Last 3 Encounters:  ?09/02/21 96.4 kg (212 lb 8 oz)  ?08/03/21 99.4 kg (219 lb 3.2 oz)  ?03/25/21 89.7 kg (197 lb 12.8 oz)  ? ?BP 108/62   Pulse 64   Wt 96.4 kg (212 lb 8 oz)   SpO2 93%   BMI 30.06 kg/m?  ?General:  NAD. No resp difficulty ?HEENT: Normal ?Neck: Supple. No JVD. Carotids 2+ bilat; no bruits. No lymphadenopathy or thryomegaly appreciated. ?Cor: PMI nondisplaced. Regular rate & rhythm. No rubs, gallops or murmurs. ?Lungs: Clear ?Abdomen: Obese, nontender, nondistended. No hepatosplenomegaly. No bruits or masses. Good bowel sounds. ?Extremities: No cyanosis, clubbing, rash, edema ?Neuro: Alert & oriented x 3, cranial nerves grossly intact. Moves all 4 extremities w/o difficulty. Affect pleasant. ? ?Assessment/Plan: ?1. CAD: LHC in 6/19 with CTOs of LAD and RCA.  Cardiac MRI showed significant myocardial viability. He is s/p CABG x  4 in 9/19.  No chest pain. ?- Continue ASA 81 and statin.  ?2. Chronic systolic CHF: Ischemic cardiomyopathy.  Echo in 6/19 with EF 25-20%.  Cardiac MRI in 7/19 with EF 28%, significant viability noted. RHC in 7/19 showed mildly elevated filling pressure and low cardiac output. Now s/p CABG in 9/19.  Echo post-op in 12/19 showed EF up to 40%.  3/21 echo with EF up to 40-45%, and echo in 5/22 with EF 45-50%.  NYHA class II symptoms, he is not volume overloaded on exam, weight down 7 lbs. ReDs 26% ?- Painful gynecomastia with spironolactone and eplerenone. Did not tolerate Farxiga due to urinary urgency.  ?- Continue Entresto 97/103 bid. Recent BMET ok, K 4.3, creatinine 1.17 ?- Continue Coreg 25 mg bid.  ?- Continue Lasix  20 mg daily and KCl 10 daily.  ?- EF is out of range of ICD.  ?3. COPD: He has quit smoking since 6/19.  ?4. Hyperlipidemia: Continue atorvastatin and Vascepa, lipids ok 1/23.   ?5. PAD: Minimal claudication.  He saw Dr. Gwenlyn Found, plan for medical management for now.   ? ?Followup in 4 months with Dr. Aundra Dubin. ? ?Rafael Bihari FNP-BC ?09/02/2021 ? ?

## 2021-09-02 ENCOUNTER — Other Ambulatory Visit: Payer: Self-pay

## 2021-09-02 ENCOUNTER — Encounter (HOSPITAL_COMMUNITY): Payer: Self-pay

## 2021-09-02 ENCOUNTER — Ambulatory Visit (HOSPITAL_COMMUNITY)
Admission: RE | Admit: 2021-09-02 | Discharge: 2021-09-02 | Disposition: A | Discharge status: Home / Self Care | Payer: Medicare HMO | Source: Ambulatory Visit | Attending: Family Medicine | Admitting: Family Medicine

## 2021-09-02 VITALS — BP 108/62 | HR 64 | Ht 69.0 in | Wt 212.5 lb

## 2021-09-02 DIAGNOSIS — I2510 Atherosclerotic heart disease of native coronary artery without angina pectoris: Secondary | ICD-10-CM | POA: Diagnosis not present

## 2021-09-02 DIAGNOSIS — Z7982 Long term (current) use of aspirin: Secondary | ICD-10-CM | POA: Insufficient documentation

## 2021-09-02 DIAGNOSIS — I739 Peripheral vascular disease, unspecified: Secondary | ICD-10-CM | POA: Diagnosis not present

## 2021-09-02 DIAGNOSIS — I5022 Chronic systolic (congestive) heart failure: Secondary | ICD-10-CM | POA: Diagnosis not present

## 2021-09-02 DIAGNOSIS — I255 Ischemic cardiomyopathy: Secondary | ICD-10-CM | POA: Insufficient documentation

## 2021-09-02 DIAGNOSIS — Z8616 Personal history of COVID-19: Secondary | ICD-10-CM | POA: Diagnosis not present

## 2021-09-02 DIAGNOSIS — I493 Ventricular premature depolarization: Secondary | ICD-10-CM | POA: Diagnosis not present

## 2021-09-02 DIAGNOSIS — I252 Old myocardial infarction: Secondary | ICD-10-CM | POA: Insufficient documentation

## 2021-09-02 DIAGNOSIS — Z951 Presence of aortocoronary bypass graft: Secondary | ICD-10-CM | POA: Diagnosis not present

## 2021-09-02 DIAGNOSIS — J449 Chronic obstructive pulmonary disease, unspecified: Secondary | ICD-10-CM | POA: Diagnosis not present

## 2021-09-02 DIAGNOSIS — Z79899 Other long term (current) drug therapy: Secondary | ICD-10-CM | POA: Diagnosis not present

## 2021-09-02 DIAGNOSIS — E785 Hyperlipidemia, unspecified: Secondary | ICD-10-CM | POA: Insufficient documentation

## 2021-09-02 DIAGNOSIS — Z87891 Personal history of nicotine dependence: Secondary | ICD-10-CM | POA: Insufficient documentation

## 2021-09-02 NOTE — Progress Notes (Signed)
ReDS Vest / Clip - 09/02/21 1300   ? ?  ? ReDS Vest / Clip  ? Station Marker D   ? Ruler Value 32   ? ReDS Value Range Low volume   ? ReDS Actual Value 26   ? Anatomical Comments sitting   ? ?  ?  ? ?  ? ? ?

## 2021-09-02 NOTE — Patient Instructions (Addendum)
Thank you for coming in today  ? ?EKG was done today ? ?Your physician recommends that you schedule a follow-up appointment in:  ?4 months with Dr. Aundra Dubin ? ? ?At the [facility], you and your health needs are our priority. As part of our continuing mission to provide you with exceptional heart care, we have created designated Provider Care Teams. These Care Teams include your primary Cardiologist (physician) and Advanced Practice Providers (APPs- Physician Assistants and Nurse Practitioners) who all work together to provide you with the care you need, when you need it.  ? ?You may see any of the following providers on your designated Care Team at your next follow up: ?Dr Glori Bickers ?Dr [name] ?Darrick Grinder, NP ?Lyda Jester, PA ?Jessica Milford,NP ?Marlyce Huge, PA ?Audry Riles, PharmD ? ? ?Please be sure to bring in all your medications bottles to every appointment.  ? ?If you have any questions or concerns before your next appointment please send [place] a message through [place] or call our office at [phone].   ? ?TO LEAVE A MESSAGE FOR THE NURSE SELECT OPTION 2, PLEASE LEAVE A MESSAGE INCLUDING: ?YOUR NAME ?DATE OF BIRTH ?CALL BACK NUMBER ?REASON FOR CALL**this is important as we prioritize the call backs ? ?YOU WILL RECEIVE A CALL BACK THE SAME DAY AS LONG AS YOU CALL BEFORE 4:00 PM ? ?

## 2021-11-01 DIAGNOSIS — I5022 Chronic systolic (congestive) heart failure: Secondary | ICD-10-CM | POA: Diagnosis not present

## 2021-11-01 DIAGNOSIS — I700 Atherosclerosis of aorta: Secondary | ICD-10-CM | POA: Diagnosis not present

## 2021-11-01 DIAGNOSIS — E785 Hyperlipidemia, unspecified: Secondary | ICD-10-CM | POA: Diagnosis not present

## 2021-12-07 DIAGNOSIS — Z125 Encounter for screening for malignant neoplasm of prostate: Secondary | ICD-10-CM | POA: Diagnosis not present

## 2021-12-07 DIAGNOSIS — E1151 Type 2 diabetes mellitus with diabetic peripheral angiopathy without gangrene: Secondary | ICD-10-CM | POA: Diagnosis not present

## 2021-12-07 DIAGNOSIS — I10 Essential (primary) hypertension: Secondary | ICD-10-CM | POA: Diagnosis not present

## 2021-12-07 DIAGNOSIS — E785 Hyperlipidemia, unspecified: Secondary | ICD-10-CM | POA: Diagnosis not present

## 2021-12-10 DIAGNOSIS — Z0000 Encounter for general adult medical examination without abnormal findings: Secondary | ICD-10-CM | POA: Diagnosis not present

## 2021-12-14 DIAGNOSIS — E1151 Type 2 diabetes mellitus with diabetic peripheral angiopathy without gangrene: Secondary | ICD-10-CM | POA: Diagnosis not present

## 2021-12-14 DIAGNOSIS — N1831 Chronic kidney disease, stage 3a: Secondary | ICD-10-CM | POA: Diagnosis not present

## 2021-12-14 DIAGNOSIS — I25810 Atherosclerosis of coronary artery bypass graft(s) without angina pectoris: Secondary | ICD-10-CM | POA: Diagnosis not present

## 2021-12-14 DIAGNOSIS — R972 Elevated prostate specific antigen [PSA]: Secondary | ICD-10-CM | POA: Diagnosis not present

## 2021-12-14 DIAGNOSIS — R82998 Other abnormal findings in urine: Secondary | ICD-10-CM | POA: Diagnosis not present

## 2021-12-14 DIAGNOSIS — I5022 Chronic systolic (congestive) heart failure: Secondary | ICD-10-CM | POA: Diagnosis not present

## 2021-12-14 DIAGNOSIS — Z0000 Encounter for general adult medical examination without abnormal findings: Secondary | ICD-10-CM | POA: Diagnosis not present

## 2021-12-14 DIAGNOSIS — E785 Hyperlipidemia, unspecified: Secondary | ICD-10-CM | POA: Diagnosis not present

## 2021-12-14 DIAGNOSIS — E669 Obesity, unspecified: Secondary | ICD-10-CM | POA: Diagnosis not present

## 2021-12-14 DIAGNOSIS — Z1331 Encounter for screening for depression: Secondary | ICD-10-CM | POA: Diagnosis not present

## 2021-12-14 DIAGNOSIS — I10 Essential (primary) hypertension: Secondary | ICD-10-CM | POA: Diagnosis not present

## 2021-12-14 DIAGNOSIS — Z1389 Encounter for screening for other disorder: Secondary | ICD-10-CM | POA: Diagnosis not present

## 2021-12-21 DIAGNOSIS — R972 Elevated prostate specific antigen [PSA]: Secondary | ICD-10-CM | POA: Diagnosis not present

## 2021-12-30 DIAGNOSIS — N401 Enlarged prostate with lower urinary tract symptoms: Secondary | ICD-10-CM | POA: Diagnosis not present

## 2021-12-30 DIAGNOSIS — R3912 Poor urinary stream: Secondary | ICD-10-CM | POA: Diagnosis not present

## 2021-12-30 DIAGNOSIS — R972 Elevated prostate specific antigen [PSA]: Secondary | ICD-10-CM | POA: Diagnosis not present

## 2021-12-31 DIAGNOSIS — H35343 Macular cyst, hole, or pseudohole, bilateral: Secondary | ICD-10-CM | POA: Diagnosis not present

## 2021-12-31 DIAGNOSIS — H35373 Puckering of macula, bilateral: Secondary | ICD-10-CM | POA: Diagnosis not present

## 2021-12-31 DIAGNOSIS — H26491 Other secondary cataract, right eye: Secondary | ICD-10-CM | POA: Diagnosis not present

## 2022-01-04 ENCOUNTER — Encounter (HOSPITAL_COMMUNITY): Payer: Medicare HMO | Admitting: Cardiology

## 2022-01-04 ENCOUNTER — Ambulatory Visit (HOSPITAL_COMMUNITY)
Admission: RE | Admit: 2022-01-04 | Discharge: 2022-01-04 | Disposition: A | Discharge status: Home / Self Care | Payer: Medicare HMO | Source: Ambulatory Visit | Attending: Cardiology | Admitting: Cardiology

## 2022-01-04 ENCOUNTER — Encounter (HOSPITAL_COMMUNITY): Payer: Self-pay | Admitting: Cardiology

## 2022-01-04 VITALS — BP 122/70 | HR 65 | Wt 215.0 lb

## 2022-01-04 DIAGNOSIS — I5022 Chronic systolic (congestive) heart failure: Secondary | ICD-10-CM | POA: Diagnosis not present

## 2022-01-04 DIAGNOSIS — I252 Old myocardial infarction: Secondary | ICD-10-CM | POA: Insufficient documentation

## 2022-01-04 DIAGNOSIS — E785 Hyperlipidemia, unspecified: Secondary | ICD-10-CM | POA: Insufficient documentation

## 2022-01-04 DIAGNOSIS — Z7901 Long term (current) use of anticoagulants: Secondary | ICD-10-CM | POA: Diagnosis not present

## 2022-01-04 DIAGNOSIS — I255 Ischemic cardiomyopathy: Secondary | ICD-10-CM | POA: Insufficient documentation

## 2022-01-04 DIAGNOSIS — R0600 Dyspnea, unspecified: Secondary | ICD-10-CM | POA: Diagnosis not present

## 2022-01-04 DIAGNOSIS — J449 Chronic obstructive pulmonary disease, unspecified: Secondary | ICD-10-CM | POA: Diagnosis not present

## 2022-01-04 DIAGNOSIS — Z951 Presence of aortocoronary bypass graft: Secondary | ICD-10-CM | POA: Insufficient documentation

## 2022-01-04 DIAGNOSIS — Z8616 Personal history of COVID-19: Secondary | ICD-10-CM | POA: Diagnosis not present

## 2022-01-04 DIAGNOSIS — Z87891 Personal history of nicotine dependence: Secondary | ICD-10-CM | POA: Insufficient documentation

## 2022-01-04 DIAGNOSIS — I2510 Atherosclerotic heart disease of native coronary artery without angina pectoris: Secondary | ICD-10-CM | POA: Diagnosis not present

## 2022-01-04 DIAGNOSIS — Z79899 Other long term (current) drug therapy: Secondary | ICD-10-CM | POA: Diagnosis not present

## 2022-01-04 DIAGNOSIS — I739 Peripheral vascular disease, unspecified: Secondary | ICD-10-CM

## 2022-01-04 NOTE — Progress Notes (Signed)
HF Cardiology: Dr. Aundra Dubin PCP: Dr. Forde Dandy  81 y.o. with history of CAD and ischemic cardiomyopathy presents for followup of CHF. Patient had no cardiac history prior to 6/19. However, since around 12/18, he had noted exertional dyspnea.  In 6/19, [place] was [place].  He developed very severe dyspnea and went to the hospital in [place] where he was found to have NSTEMI.  LHC was done, showing chronic occlusions of the LAD and RCA with collaterals.  Echo sh//owed EF 25-30%.  No intervention, he was eventually discharged to come home.  He has been seen by Dr. Prescott Gum for CABG evaluation.  Cardiac MRI in 6/19 showed EF 28% with substantial viability.  RHC was done in 7/19, showing mildly elevated filling pressures but low cardiac output, with CI 1.7.  After RHC, I increased his Lasix and added digoxin.    Of note, he has quit smoking since 6/19.   He was admitted for CABG in 9/19.  Volume overload post-op, treated with diuresis.    Echo in 12/19 showed EF 40%, mid anteroseptal and mid inferoseptal akinesis.   He had painful gynecomastia with spironolactone and switched to eplerenone but did not resolve.  Now off eplerenone with resolution of symptoms.   Echo in 3/21 showed EF 40-45%, mild LVH, normal RV.   ABIs in 3/21 showed moderate PAD, he saw Dr. Alvester Chou with plan for conservative management for now.   He had COVID-19 in 2/22.   Echo in 5/22 showed EF up to 45-50%, normal RV.  ABIs in 5/22 with unchanged mild disease on right and moderate on left.   Spinal osteomyelitis in 8/22 with Strep mitis.   He returns for followup of CHF.   Weight up 3 lbs.  Generally doing ok, no dyspnea walking on flat ground.  Mild dyspnea walking up stairs.  No chest pain.  Rare claudication.  No rest pain or pedal ulcers.  No orthopnea/PND.   ECG (personally reviewed): NSR, PVC, lateral TWIs  Labs (7/19): hgb 14.1, K 4.1, creatinine 1.26, LDL 28 Labs (9/19): K 3.7, creatinine 1.09, LFTs  normal, hgb 12.6 Labs (11/19): K 4.4, creatinine 1.31 Labs (12/19): K 4.4, creatinine 1.19 Labs (3/20): K 4.5, creatinine 1.12 Labs (5/20): LDL 41, HDL 27, TGs 176  Labs (8/20): K 4.9, creatinine 1.12 Labs (3/21): K 4.5, creatinine 1.14 Labs (6/21): K 4.2, creatinine 1.13, LDL 35, TGs 142 Labs (9/21): K 4.3, creatinine 1.12 Labs (2/22): BNP 301, K 4.2, creatinine 1.15 Labs (5/22): TGs 75, LDL 26 Labs (8/22): K 3.7,creatinine 0.8 Labs (9/22): K 4.5, creatinine 0.92 Labs (6/23): K 5, creatinine 1.1, LDL 51, TGs 97  PMH: 1. H/o retinal detachment 2. H/o transverse myelitis (remote) 3. CAD: NSTEMI 6/19 in [place]. - LHC (6/19): Chronic total occlusion of LAD and chronic total occlusion of RCA with collaterals.  - CABG (9/19): LIMA-LAD, SVG-D, SVG-ramus, SVG-PDA.  4. Chronic systolic CHF: Ischemic cardiomyopathy.   - Echo (6/19): LV moderately dilated, EF 25-30%, mild MR.  - Cardiac MRI (6/19): EF 28%, significant viability noted.  - RHC (7/19): mean RA 8, PA 40/16 mean 26, mean PCWP 23, CI 1.7, PVR 0.83 WU - Echo (12/19): EF 40%, mid inferoseptal and mid anteroseptal akinesis, inferior hypokinesis, normal RV size and systolic function.  - Painful gynecomastia with spironolactone and eplerenone. - Echo (3/21): EF 40-45%, mild LVH, normal RV  - Echo (5/22): EF 45-50%, normal RV.  5. COPD: PFTs (9/19) with moderate obstruction.  6. PAD:  ABIs (3/21) with 0.59 on right, 0.8 on left.  - ABIs (5/22): stable mild disease on right and moderate on left.  7. Spinal osteomyelitis: Streptococcus mitis, 8/22  SH: Married, lives in [place].  He quit smoking in 6/19.    Family History  Problem Relation Age of Onset   Heart disease Mother    Cancer Mother    Heart attack Father    ROS: All systems reviewed and negative except as per HPI.   Current Outpatient Medications  Medication Sig Dispense Refill   aspirin EC 81 MG tablet Take 1 tablet (81 mg total) by mouth daily. Swallow  whole. 90 tablet 3   atorvastatin (LIPITOR) 40 MG tablet Take 1 tablet (40 mg total) by mouth at bedtime. 90 tablet 3   carvedilol (COREG) 25 MG tablet TAKE 1 TABLET(25 MG) BY MOUTH TWICE DAILY 60 tablet 8   ENTRESTO 97-103 MG TAKE 1 TABLET BY MOUTH TWICE DAILY 60 tablet 5   furosemide (LASIX) 20 MG tablet Take 1 tablet (20 mg total) by mouth daily. 90 tablet 3   icosapent Ethyl (VASCEPA) 1 g capsule Take 2 capsules (2 g total) by mouth 2 (two) times daily. 360 capsule 3   Polyethyl Glycol-Propyl Glycol 0.4-0.3 % SOLN Place 1 drop into both eyes 2 (two) times daily.     No current facility-administered medications for this encounter.   BP 122/70   Pulse 65   Wt 97.5 kg (215 lb)   SpO2 92%   BMI 31.75 kg/m  General: NAD Neck: No JVD, no thyromegaly or thyroid nodule.  Lungs: Clear to auscultation bilaterally with normal respiratory effort. CV: Nondisplaced PMI.  Heart regular S1/S2, no S3/S4, no murmur.  No peripheral edema.  No carotid bruit.  Unable to palpate pedal pulses.  Abdomen: Soft, nontender, no hepatosplenomegaly, no distention.  Skin: Intact without lesions or rashes.  Neurologic: Alert and oriented x 3.  Psych: Normal affect. Extremities: No clubbing or cyanosis.  HEENT: Normal.   Assessment/Plan: 1. CAD: LHC in 6/19 with CTOs of LAD and RCA.  Cardiac MRI showed significant myocardial viability. He is s/p CABG x 4 in 9/19.  No chest pain. - Continue ASA 81 and statin.  2. Chronic systolic CHF: Ischemic cardiomyopathy.  Echo in 6/19 with EF 25-20%.  Cardiac MRI in 7/19 with EF 28%, significant viability noted. RHC in 7/19 showed mildly elevated filling pressure and low cardiac output. Now s/p CABG in 9/19.  Echo post-op in 12/19 showed EF up to 40%.  3/21 echo with EF up to 40-45%, and echo in 5/22 with EF 45-50%.  NYHA class II symptoms, not volume overloaded.   - Painful gynecomastia with spironolactone and eplerenone. Did not tolerate Farxiga due to urinary urgency.  -  Continue Entresto to 97/103 bid.    - Continue Coreg 25 mg bid.  - Continue Lasix 20 mg daily and can stop KCl with recent K high normal.  Recent creatinine stable.  - EF is out of range of ICD.  - I will arrange for repeat echo.  3. COPD: He has quit smoking since 6/19.  4. Hyperlipidemia: Continue atorvastatin and Vascepa, good lipids in 6/23.    5. PAD: Minimal claudication with history of significant CAD.  - Due for repeat ABIs, will arrange.    Followup in 4 months with APP.   [name] 01/04/2022

## 2022-01-04 NOTE — Patient Instructions (Addendum)
EKG done today.  No Labs done today.   STOP taking Potassium  No medication changes were made. Please continue all current medications as prescribed.  Your physician has requested that you have an ankle brachial index (ABI). During this test an ultrasound and blood pressure cuff are used to evaluate the arteries that supply the arms and legs with blood. Allow thirty minutes for this exam. There are no restrictions or special instructions. This has to be approved through your insurance company prior to your appointment, once approved we will contact you to schedule an appointment.   Your physician recommends that you schedule a follow-up appointment in: 4 months with our NP/[facility] and soon for an echo  Your physician has requested that you have an echocardiogram. Echocardiography is a painless test that uses sound waves to create images of your heart. It provides your doctor with information about the size and shape of your heart and how well your heart's chambers and valves are working. This procedure takes approximately one hour. There are no restrictions for this procedure.  If you have any questions or concerns before your next appointment please send [place] a message through [place] or call our office at [phone].    TO LEAVE A MESSAGE FOR THE NURSE SELECT OPTION 2, PLEASE LEAVE A MESSAGE INCLUDING: YOUR NAME DATE OF BIRTH CALL BACK NUMBER REASON FOR CALL**this is important as we prioritize the call backs  YOU WILL RECEIVE A CALL BACK THE SAME DAY AS LONG AS YOU CALL BEFORE 4:00 PM   Do the following things EVERYDAY: Weigh yourself in the morning before breakfast. Write it down and keep it in a log. Take your medicines as prescribed Eat low salt foods--Limit salt (sodium) to 2000 mg per day.  Stay as active as you can everyday Limit all fluids for the day to less than 2 liters   At the [facility], you and your health needs are our priority. As part of our  continuing mission to provide you with exceptional heart care, we have created designated Provider Care Teams. These Care Teams include your primary Cardiologist (physician) and Advanced Practice Providers (APPs- Physician Assistants and Nurse Practitioners) who all work together to provide you with the care you need, when you need it.   You may see any of the following providers on your designated Care Team at your next follow up: Dr Glori Bickers Dr Haynes Kerns, NP Lyda Jester, [place] Audry Riles, PharmD   Please be sure to bring in all your medications bottles to every appointment.

## 2022-01-15 ENCOUNTER — Ambulatory Visit (HOSPITAL_COMMUNITY)
Admission: RE | Admit: 2022-01-15 | Discharge: 2022-01-15 | Disposition: A | Discharge status: Home / Self Care | Payer: Medicare HMO | Source: Ambulatory Visit | Attending: Cardiology | Admitting: Cardiology

## 2022-01-15 DIAGNOSIS — I5022 Chronic systolic (congestive) heart failure: Secondary | ICD-10-CM | POA: Diagnosis not present

## 2022-01-15 DIAGNOSIS — I10 Essential (primary) hypertension: Secondary | ICD-10-CM | POA: Diagnosis not present

## 2022-01-15 DIAGNOSIS — I2510 Atherosclerotic heart disease of native coronary artery without angina pectoris: Secondary | ICD-10-CM | POA: Insufficient documentation

## 2022-01-15 DIAGNOSIS — I252 Old myocardial infarction: Secondary | ICD-10-CM | POA: Diagnosis not present

## 2022-01-15 DIAGNOSIS — Z951 Presence of aortocoronary bypass graft: Secondary | ICD-10-CM | POA: Insufficient documentation

## 2022-01-15 LAB — ECHOCARDIOGRAM COMPLETE
AR max vel: 2.65 cm2
AV Area VTI: 2.47 cm2
AV Area mean vel: 2.37 cm2
AV Mean grad: 3 mmHg
AV Peak grad: 4.7 mmHg
Ao pk vel: 1.08 m/s
Area-P 1/2: 3.65 cm2
Calc EF: 49.5 %
S' Lateral: 3.9 cm
Single Plane A2C EF: 52.8 %
Single Plane A4C EF: 50.3 %

## 2022-01-15 NOTE — Progress Notes (Signed)
  Echocardiogram 2D Echocardiogram has been performed.  Merrie Roof F 01/15/2022, 4:26 PM

## 2022-01-20 ENCOUNTER — Ambulatory Visit (HOSPITAL_COMMUNITY)
Admission: RE | Admit: 2022-01-20 | Discharge: 2022-01-20 | Disposition: A | Discharge status: Home / Self Care | Payer: Medicare HMO | Source: Ambulatory Visit | Attending: Cardiovascular Disease | Admitting: Cardiovascular Disease

## 2022-01-20 DIAGNOSIS — I739 Peripheral vascular disease, unspecified: Secondary | ICD-10-CM | POA: Diagnosis not present

## 2022-01-22 ENCOUNTER — Telehealth (HOSPITAL_COMMUNITY): Payer: Self-pay

## 2022-01-22 NOTE — Telephone Encounter (Addendum)
Pt aware of results and denies any pain in legs while walking. He would like to hold off on the referral for now. Informed pt that if he starts having pain in his legs while walking to call us so that we can refer him to either Dr, Gwenlyn Found or Dr. Fletcher Anon.  ----- Message from Larey Dresser, MD sent at 01/20/2022 10:01 PM EDT ----- Moderately abnormal ABIs bilaterally.  Should see Dr. Fletcher Anon or Dr. Gwenlyn Found if he has significant claudication symptoms (pain in legs with walking).

## 2022-02-12 ENCOUNTER — Other Ambulatory Visit (HOSPITAL_COMMUNITY): Payer: Self-pay | Admitting: Cardiology

## 2022-03-14 ENCOUNTER — Other Ambulatory Visit: Payer: Self-pay | Admitting: Cardiology

## 2022-03-15 ENCOUNTER — Other Ambulatory Visit: Payer: Self-pay | Admitting: Cardiology

## 2022-04-13 ENCOUNTER — Telehealth (HOSPITAL_COMMUNITY): Payer: Self-pay | Admitting: *Deleted

## 2022-04-13 NOTE — Telephone Encounter (Signed)
Does pt need to pre med for dental cleanings?  Call back # [phone]

## 2022-04-13 NOTE — Telephone Encounter (Signed)
Left vm at dental office.

## 2022-04-13 NOTE — Telephone Encounter (Signed)
Does not need premedication.

## 2022-04-23 ENCOUNTER — Encounter (INDEPENDENT_AMBULATORY_CARE_PROVIDER_SITE_OTHER): Payer: Medicare HMO | Admitting: Ophthalmology

## 2022-05-07 ENCOUNTER — Ambulatory Visit (HOSPITAL_COMMUNITY)
Admission: RE | Admit: 2022-05-07 | Discharge: 2022-05-07 | Disposition: A | Discharge status: Home / Self Care | Payer: Medicare HMO | Source: Ambulatory Visit | Attending: Family Medicine | Admitting: Family Medicine

## 2022-05-07 ENCOUNTER — Encounter (HOSPITAL_COMMUNITY): Payer: Self-pay

## 2022-05-07 VITALS — BP 146/68 | HR 60 | Wt 216.2 lb

## 2022-05-07 DIAGNOSIS — I2510 Atherosclerotic heart disease of native coronary artery without angina pectoris: Secondary | ICD-10-CM

## 2022-05-07 DIAGNOSIS — E785 Hyperlipidemia, unspecified: Secondary | ICD-10-CM | POA: Diagnosis not present

## 2022-05-07 DIAGNOSIS — J449 Chronic obstructive pulmonary disease, unspecified: Secondary | ICD-10-CM

## 2022-05-07 DIAGNOSIS — I739 Peripheral vascular disease, unspecified: Secondary | ICD-10-CM

## 2022-05-07 DIAGNOSIS — I5022 Chronic systolic (congestive) heart failure: Secondary | ICD-10-CM | POA: Insufficient documentation

## 2022-05-07 LAB — BASIC METABOLIC PANEL
Anion gap: 7 (ref 5–15)
BUN: 14 mg/dL (ref 8–23)
CO2: 29 mmol/L (ref 22–32)
Calcium: 8.8 mg/dL — ABNORMAL LOW (ref 8.9–10.3)
Chloride: 101 mmol/L (ref 98–111)
Creatinine, Ser: 1.1 mg/dL (ref 0.61–1.24)
GFR, Estimated: >60 mL/min (ref >60)
Glucose, Bld: 86 mg/dL (ref 70–99)
Potassium: 4.4 mmol/L (ref 3.5–5.1)
Sodium: 137 mmol/L (ref 135–145)

## 2022-05-07 NOTE — Patient Instructions (Signed)
Labs done today. We will contact you only if your labs are abnormal.  No medication changes were made. Please continue all current medications as prescribed.  Your physician recommends that you schedule a follow-up appointment in: 6 months with Dr. Aundra Dubin Please contact our office in March to schedule a May 2024 appointment.   If you have any questions or concerns before your next appointment please send [place] a message through [place] or call our office at [phone].    TO LEAVE A MESSAGE FOR THE NURSE SELECT OPTION 2, PLEASE LEAVE A MESSAGE INCLUDING: YOUR NAME DATE OF BIRTH CALL BACK NUMBER REASON FOR CALL**this is important as we prioritize the call backs  YOU WILL RECEIVE A CALL BACK THE SAME DAY AS LONG AS YOU CALL BEFORE 4:00 PM   Do the following things EVERYDAY: Weigh yourself in the morning before breakfast. Write it down and keep it in a log. Take your medicines as prescribed Eat low salt foods--Limit salt (sodium) to 2000 mg per day.  Stay as active as you can everyday Limit all fluids for the day to less than 2 liters   At the [facility], you and your health needs are our priority. As part of our continuing mission to provide you with exceptional heart care, we have created designated Provider Care Teams. These Care Teams include your primary Cardiologist (physician) and Advanced Practice Providers (APPs- Physician Assistants and Nurse Practitioners) who all work together to provide you with the care you need, when you need it.   You may see any of the following providers on your designated Care Team at your next follow up: Dr Glori Bickers Dr Haynes Kerns, NP Lyda Jester, [place] Audry Riles, PharmD   Please be sure to bring in all your medications bottles to every appointment.

## 2022-05-07 NOTE — Progress Notes (Signed)
HF Cardiology: Dr. Aundra Dubin PCP: Dr. Forde Dandy  81 y.o. with history of CAD and ischemic cardiomyopathy presents for followup of CHF. Patient had no cardiac history prior to 6/19. However, since around 12/18, he had noted exertional dyspnea.  In 6/19, [place].  He developed very severe dyspnea and went to the hospital in [place] where he was found to have NSTEMI.  LHC was done, showing chronic occlusions of the LAD and RCA with collaterals.  Echo sh//owed EF 25-30%.  No intervention, he was eventually discharged to come home.  He has been seen by Dr. Prescott Gum for CABG evaluation.  Cardiac MRI in 6/19 showed EF 28% with substantial viability.  RHC was done in 7/19, showing mildly elevated filling pressures but low cardiac output, with CI 1.7.  After RHC, I increased his Lasix and added digoxin.    Of note, he has quit smoking since 6/19.   He was admitted for CABG in 9/19.  Volume overload post-op, treated with diuresis.    Echo in 12/19 showed EF 40%, mid anteroseptal and mid inferoseptal akinesis.   He had painful gynecomastia with spironolactone and switched to eplerenone but did not resolve.  Now off eplerenone with resolution of symptoms.   Echo in 3/21 showed EF 40-45%, mild LVH, normal RV.   ABIs in 3/21 showed moderate PAD, he saw Dr. Alvester Chou with plan for conservative management for now.   He had COVID-19 in 2/22.   Echo in 5/22 showed EF up to 45-50%, normal RV.  ABIs in 5/22 with unchanged mild disease on right and moderate on left.   Spinal osteomyelitis in 8/22 with Strep mitis.   Echo 7/23 showed EF 50-55%, grade I DD, normal RV  Today he returns for HF follow up. Overall feeling fine. He has shortness of breath if he rushes. Works part time at [employer], no SOB carrying 20 lb load. No claudication, does have neuropathy in feet. Denies palpitations, abnormal bleeding, CP, dizziness, edema, or PND/Orthopnea. Appetite ok. No fever or  chills. He does not weigh at home. Taking all medications.     ECG (personally reviewed): none ordered today.  Labs (7/19): hgb 14.1, K 4.1, creatinine 1.26, LDL 28 Labs (9/19): K 3.7, creatinine 1.09, LFTs normal, hgb 12.6 Labs (11/19): K 4.4, creatinine 1.31 Labs (12/19): K 4.4, creatinine 1.19 Labs (3/20): K 4.5, creatinine 1.12 Labs (5/20): LDL 41, HDL 27, TGs 176  Labs (8/20): K 4.9, creatinine 1.12 Labs (3/21): K 4.5, creatinine 1.14 Labs (6/21): K 4.2, creatinine 1.13, LDL 35, TGs 142 Labs (9/21): K 4.3, creatinine 1.12 Labs (2/22): BNP 301, K 4.2, creatinine 1.15 Labs (5/22): TGs 75, LDL 26 Labs (8/22): K 3.7,creatinine 0.8 Labs (9/22): K 4.5, creatinine 0.92 Labs (6/23): K 5, creatinine 1.1, LDL 51, TGs 97 Labs (2/23): K 4.3, creatinine 1.17, LDL 36, HDL 37  PMH: 1. H/o retinal detachment 2. H/o transverse myelitis (remote) 3. CAD: NSTEMI 6/19 in [place]. - LHC (6/19): Chronic total occlusion of LAD and chronic total occlusion of RCA with collaterals.  - CABG (9/19): LIMA-LAD, SVG-D, SVG-ramus, SVG-PDA.  4. Chronic systolic CHF: Ischemic cardiomyopathy.   - Echo (6/19): LV moderately dilated, EF 25-30%, mild MR.  - Cardiac MRI (6/19): EF 28%, significant viability noted.  - RHC (7/19): mean RA 8, PA 40/16 mean 26, mean PCWP 23, CI 1.7, PVR 0.83 WU - Echo (12/19): EF 40%, mid inferoseptal and mid anteroseptal akinesis, inferior hypokinesis, normal RV size  and systolic function.  - Painful gynecomastia with spironolactone and eplerenone. - Echo (3/21): EF 40-45%, mild LVH, normal RV  - Echo (5/22): EF 45-50%, normal RV.  - Echo 7/23 showed EF 50-55%, grade I DD, normal RV 5. COPD: PFTs (9/19) with moderate obstruction.  6. PAD: ABIs (3/21) with 0.59 on right, 0.8 on left.  - ABIs (5/22): stable mild disease on right and moderate on left.  - ABIs (7/23): moderate right and left disease 7. Spinal osteomyelitis: Streptococcus mitis, 8/22  SH: Married, lives in  [place].  He quit smoking in 6/19.    Family History  Problem Relation Age of Onset   Heart disease Mother    Cancer Mother    Heart attack Father    ROS: All systems reviewed and negative except as per HPI.   Current Outpatient Medications  Medication Sig Dispense Refill   aspirin EC 81 MG tablet Take 1 tablet (81 mg total) by mouth daily. Swallow whole. 90 tablet 3   atorvastatin (LIPITOR) 40 MG tablet Take 1 tablet (40 mg total) by mouth at bedtime. 90 tablet 3   carvedilol (COREG) 25 MG tablet TAKE 1 TABLET(25 MG) BY MOUTH TWICE DAILY 180 tablet 3   ENTRESTO 97-103 MG TAKE 1 TABLET BY MOUTH TWICE DAILY 60 tablet 11   finasteride (PROSCAR) 5 MG tablet Take 5 mg by mouth daily.     furosemide (LASIX) 20 MG tablet Take 1 tablet (20 mg total) by mouth daily. 90 tablet 3   icosapent Ethyl (VASCEPA) 1 g capsule Take 2 capsules (2 g total) by mouth 2 (two) times daily. 360 capsule 3   Loratadine (CLARITIN) 10 MG CAPS Take 10 mg by mouth as needed.     Polyethyl Glycol-Propyl Glycol 0.4-0.3 % SOLN Place 1 drop into both eyes 2 (two) times daily.     No current facility-administered medications for this encounter.   Wt Readings from Last 3 Encounters:  05/07/22 98.1 kg (216 lb 3.2 oz)  01/04/22 97.5 kg (215 lb)  09/02/21 96.4 kg (212 lb 8 oz)   BP (!) 146/68   Pulse 60   Wt 98.1 kg (216 lb 3.2 oz)   SpO2 95%   BMI 31.93 kg/m  Physical Exam General:  NAD. No resp difficulty HEENT: Normal Neck: Supple. No JVD. Carotids 2+ bilat; no bruits. No lymphadenopathy or thryomegaly appreciated. Cor: PMI nondisplaced. Regular rate & rhythm. No rubs, gallops or murmurs. Lungs: Clear Abdomen: Soft, nontender, nondistended. No hepatosplenomegaly. No bruits or masses. Good bowel sounds. Extremities: No cyanosis, clubbing, rash, trace pedal edema L>R Neuro: Alert & oriented x 3, cranial nerves grossly intact. Moves all 4 extremities w/o difficulty. Affect pleasant.  Assessment/Plan: 1. CAD:  LHC in 6/19 with CTOs of LAD and RCA.  Cardiac MRI showed significant myocardial viability. He is s/p CABG x 4 in 9/19.  No chest pain. - Continue ASA 81 and statin.  2. Chronic systolic CHF: Ischemic cardiomyopathy.  Echo in 6/19 with EF 25-20%.  Cardiac MRI in 7/19 with EF 28%, significant viability noted. RHC in 7/19 showed mildly elevated filling pressure and low cardiac output. Now s/p CABG in 9/19.  Echo post-op in 12/19 showed EF up to 40%.  3/21 echo with EF up to 40-45%, and echo in 5/22 with EF 45-50%.  Echo (7/23) EF 50-55%. NYHA class II symptoms, not volume overloaded.   - Painful gynecomastia with spironolactone and eplerenone.  - Did not tolerate Farxiga due to urinary urgency.  -  Continue Entresto 97/103 bid.  BMET today. - Continue Coreg 25 mg bid.  - Continue Lasix 20 mg daily.  - EF is out of range of ICD.  3. COPD: He has quit smoking since 6/19.  4. Hyperlipidemia: Continue atorvastatin and Vascepa, good lipids in 6/23.    5. PAD: No claudication with history of significant CAD.  - ABI (7/23) showed moderately abnormal bilateral ABI.  No current claudication symptoms. Will refer to Dr. Fletcher Anon if symptoms occur.  Follow up in 6 months with Dr. Wynema Birch [facility] FNP-BC 05/07/2022

## 2022-05-07 NOTE — Addendum Note (Signed)
Encounter addended by: Shonna Chock, CMA on: 80/02/8109 3:14 PM  Actions taken: Charge Capture section accepted

## 2022-06-03 ENCOUNTER — Encounter (INDEPENDENT_AMBULATORY_CARE_PROVIDER_SITE_OTHER): Payer: Medicare HMO | Admitting: Ophthalmology

## 2022-06-03 DIAGNOSIS — H338 Other retinal detachments: Secondary | ICD-10-CM | POA: Diagnosis not present

## 2022-06-03 DIAGNOSIS — H35343 Macular cyst, hole, or pseudohole, bilateral: Secondary | ICD-10-CM

## 2022-06-12 ENCOUNTER — Other Ambulatory Visit (HOSPITAL_COMMUNITY): Payer: Self-pay | Admitting: Cardiology

## 2022-06-15 DIAGNOSIS — R972 Elevated prostate specific antigen [PSA]: Secondary | ICD-10-CM | POA: Diagnosis not present

## 2022-06-16 DIAGNOSIS — I739 Peripheral vascular disease, unspecified: Secondary | ICD-10-CM | POA: Diagnosis not present

## 2022-06-16 DIAGNOSIS — E1151 Type 2 diabetes mellitus with diabetic peripheral angiopathy without gangrene: Secondary | ICD-10-CM | POA: Diagnosis not present

## 2022-06-16 DIAGNOSIS — I5022 Chronic systolic (congestive) heart failure: Secondary | ICD-10-CM | POA: Diagnosis not present

## 2022-06-16 DIAGNOSIS — I10 Essential (primary) hypertension: Secondary | ICD-10-CM | POA: Diagnosis not present

## 2022-06-16 DIAGNOSIS — E785 Hyperlipidemia, unspecified: Secondary | ICD-10-CM | POA: Diagnosis not present

## 2022-06-16 DIAGNOSIS — N1831 Chronic kidney disease, stage 3a: Secondary | ICD-10-CM | POA: Diagnosis not present

## 2022-06-16 DIAGNOSIS — I700 Atherosclerosis of aorta: Secondary | ICD-10-CM | POA: Diagnosis not present

## 2022-06-16 DIAGNOSIS — I25810 Atherosclerosis of coronary artery bypass graft(s) without angina pectoris: Secondary | ICD-10-CM | POA: Diagnosis not present

## 2022-06-16 DIAGNOSIS — E669 Obesity, unspecified: Secondary | ICD-10-CM | POA: Diagnosis not present

## 2022-06-22 DIAGNOSIS — R3912 Poor urinary stream: Secondary | ICD-10-CM | POA: Diagnosis not present

## 2022-06-22 DIAGNOSIS — R350 Frequency of micturition: Secondary | ICD-10-CM | POA: Diagnosis not present

## 2022-06-22 DIAGNOSIS — R972 Elevated prostate specific antigen [PSA]: Secondary | ICD-10-CM | POA: Diagnosis not present

## 2022-06-22 DIAGNOSIS — N401 Enlarged prostate with lower urinary tract symptoms: Secondary | ICD-10-CM | POA: Diagnosis not present

## 2022-07-15 ENCOUNTER — Other Ambulatory Visit (HOSPITAL_COMMUNITY): Payer: Self-pay

## 2022-07-15 MED ORDER — FUROSEMIDE 20 MG PO TABS: 20.0000 mg | ORAL_TABLET | Freq: Every day | ORAL | 3 refills | Status: DC

## 2022-08-31 ENCOUNTER — Other Ambulatory Visit (HOSPITAL_COMMUNITY): Payer: Self-pay

## 2022-08-31 MED ORDER — ICOSAPENT ETHYL 1 G PO CAPS: 2.0000 g | ORAL_CAPSULE | Freq: Two times a day (BID) | ORAL | 3 refills | Status: DC

## 2022-11-24 ENCOUNTER — Ambulatory Visit (HOSPITAL_COMMUNITY)
Admission: RE | Admit: 2022-11-24 | Discharge: 2022-11-24 | Disposition: A | Discharge status: Home / Self Care | Payer: Medicare HMO | Source: Ambulatory Visit | Attending: Cardiology | Admitting: Cardiology

## 2022-11-24 ENCOUNTER — Encounter (HOSPITAL_COMMUNITY): Payer: Self-pay | Admitting: Cardiology

## 2022-11-24 VITALS — BP 120/60 | HR 58 | Wt 215.4 lb

## 2022-11-24 DIAGNOSIS — R3915 Urgency of urination: Secondary | ICD-10-CM | POA: Insufficient documentation

## 2022-11-24 DIAGNOSIS — I255 Ischemic cardiomyopathy: Secondary | ICD-10-CM | POA: Insufficient documentation

## 2022-11-24 DIAGNOSIS — E785 Hyperlipidemia, unspecified: Secondary | ICD-10-CM | POA: Diagnosis not present

## 2022-11-24 DIAGNOSIS — Z87891 Personal history of nicotine dependence: Secondary | ICD-10-CM | POA: Diagnosis not present

## 2022-11-24 DIAGNOSIS — J449 Chronic obstructive pulmonary disease, unspecified: Secondary | ICD-10-CM | POA: Insufficient documentation

## 2022-11-24 DIAGNOSIS — I2510 Atherosclerotic heart disease of native coronary artery without angina pectoris: Secondary | ICD-10-CM | POA: Diagnosis not present

## 2022-11-24 DIAGNOSIS — I5022 Chronic systolic (congestive) heart failure: Secondary | ICD-10-CM | POA: Diagnosis not present

## 2022-11-24 DIAGNOSIS — N62 Hypertrophy of breast: Secondary | ICD-10-CM | POA: Diagnosis not present

## 2022-11-24 DIAGNOSIS — Z951 Presence of aortocoronary bypass graft: Secondary | ICD-10-CM | POA: Insufficient documentation

## 2022-11-24 DIAGNOSIS — Z79899 Other long term (current) drug therapy: Secondary | ICD-10-CM | POA: Diagnosis not present

## 2022-11-24 LAB — BASIC METABOLIC PANEL
Anion gap: 11 (ref 5–15)
BUN: 18 mg/dL (ref 8–23)
CO2: 27 mmol/L (ref 22–32)
Calcium: 9 mg/dL (ref 8.9–10.3)
Chloride: 101 mmol/L (ref 98–111)
Creatinine, Ser: 1.1 mg/dL (ref 0.61–1.24)
GFR, Estimated: >60 mL/min (ref >60)
Glucose, Bld: 99 mg/dL (ref 70–99)
Potassium: 4 mmol/L (ref 3.5–5.1)
Sodium: 139 mmol/L (ref 135–145)

## 2022-11-24 LAB — LIPID PANEL
Cholesterol: 93 mg/dL (ref 0–200)
HDL: 29 mg/dL — ABNORMAL LOW (ref >40)
LDL Cholesterol: 41 mg/dL (ref 0–99)
Total CHOL/HDL Ratio: 3.2 RATIO
Triglycerides: 115 mg/dL (ref <150)
VLDL: 23 mg/dL (ref 0–40)

## 2022-11-24 LAB — BRAIN NATRIURETIC PEPTIDE: B Natriuretic Peptide: 139.1 pg/mL — ABNORMAL HIGH (ref 0.0–100.0)

## 2022-11-24 NOTE — Patient Instructions (Addendum)
There has been no changes to your medications.  Labs done today, your results will be available in MyChart, we will contact you for abnormal readings.  Your physician has requested that you have an echocardiogram. Echocardiography is a painless test that uses sound waves to create images of your heart. It provides your doctor with information about the size and shape of your heart and how well your heart's chambers and valves are working. This procedure takes approximately one hour. There are no restrictions for this procedure. Please do NOT wear cologne, perfume, aftershave, or lotions (deodorant is allowed). Please arrive 15 minutes prior to your appointment time.  Your provider has ordered scan of your legs. You will be called to have this test arranged.  Your physician recommends that you schedule a follow-up appointment in: 4 months with echocardiogram  If you have any questions or concerns before your next appointment please send [place] a message through [place] or call our office at [phone].    TO LEAVE A MESSAGE FOR THE NURSE SELECT OPTION 2, PLEASE LEAVE A MESSAGE INCLUDING: YOUR NAME DATE OF BIRTH CALL BACK NUMBER REASON FOR CALL**this is important as we prioritize the call backs  YOU WILL RECEIVE A CALL BACK THE SAME DAY AS LONG AS YOU CALL BEFORE 4:00 PM  At the [facility], you and your health needs are our priority. As part of our continuing mission to provide you with exceptional heart care, we have created designated Provider Care Teams. These Care Teams include your primary Cardiologist (physician) and Advanced Practice Providers (APPs- Physician Assistants and Nurse Practitioners) who all work together to provide you with the care you need, when you need it.   You may see any of the following providers on your designated Care Team at your next follow up: Dr Arvilla Meres Dr Marca Ancona Dr. Marcos Eke, NP Robbie Lis,  [place] [facility] [place], [place] Brynda Peon, NP Karle Plumber, PharmD   Please be sure to bring in all your medications bottles to every appointment.    Thank you for choosing [facility]

## 2022-11-25 NOTE — Progress Notes (Signed)
HF Cardiology: Dr. Shirlee Latch PCP: Dr. Evlyn Kanner  82 y.o. with history of CAD and ischemic cardiomyopathy presents for followup of CHF. Patient had no cardiac history prior to 6/19. However, since around 12/18, he had noted exertional dyspnea.  In 6/19, [place] was [place].  He developed very severe dyspnea and went to the hospital in [place] where he was found to have NSTEMI.  LHC was done, showing chronic occlusions of the LAD and RCA with collaterals.  Echo sh//owed EF 25-30%.  No intervention, he was eventually discharged to come home.  He has been seen by Dr. Donata Clay for CABG evaluation.  Cardiac MRI in 6/19 showed EF 28% with substantial viability.  RHC was done in 7/19, showing mildly elevated filling pressures but low cardiac output, with CI 1.7.  After RHC, I increased his Lasix and added digoxin.    Of note, he has quit smoking since 6/19.   He was admitted for CABG in 9/19.  Volume overload post-op, treated with diuresis.    Echo in 12/19 showed EF 40%, mid anteroseptal and mid inferoseptal akinesis.   He had painful gynecomastia with spironolactone and switched to eplerenone but did not resolve.  Now off eplerenone with resolution of symptoms.   Echo in 3/21 showed EF 40-45%, mild LVH, normal RV.   ABIs in 3/21 showed moderate PAD, he saw Dr. Gery Pray with plan for conservative management for now.   He had COVID-19 in 2/22.   Echo in 5/22 showed EF up to 45-50%, normal RV.  ABIs in 5/22 with unchanged mild disease on right and moderate on left.   Spinal osteomyelitis in 8/22 with Strep mitis.   Echo 7/23 showed EF 50-55%, grade I DD, normal RV  Today he returns for HF follow up. Weight down 1 lb. Still working 2 days/week. Dyspnea with hurrying or heavy lifting.  Uses recumbent bike for exercise.  No chest pain.  No definite claudication.    ECG (personally reviewed): NSR, nonspecific anterolateral TWIs  Labs (7/19): hgb 14.1, K 4.1, creatinine 1.26, LDL  28 Labs (9/19): K 3.7, creatinine 1.09, LFTs normal, hgb 12.6 Labs (11/19): K 4.4, creatinine 1.31 Labs (12/19): K 4.4, creatinine 1.19 Labs (3/20): K 4.5, creatinine 1.12 Labs (5/20): LDL 41, HDL 27, TGs 176  Labs (8/20): K 4.9, creatinine 1.12 Labs (3/21): K 4.5, creatinine 1.14 Labs (6/21): K 4.2, creatinine 1.13, LDL 35, TGs 142 Labs (9/21): K 4.3, creatinine 1.12 Labs (2/22): BNP 301, K 4.2, creatinine 1.15 Labs (5/22): TGs 75, LDL 26 Labs (8/22): K 3.7,creatinine 0.8 Labs (9/22): K 4.5, creatinine 0.92 Labs (6/23): K 5, creatinine 1.1, LDL 51, TGs 97 Labs (2/23): K 4.3, creatinine 1.17, LDL 36, HDL 37 Labs (11/23): K 4.4, creatinine 1.1  PMH: 1. H/o retinal detachment 2. H/o transverse myelitis (remote) 3. CAD: NSTEMI 6/19 in [place]. - LHC (6/19): Chronic total occlusion of LAD and chronic total occlusion of RCA with collaterals.  - CABG (9/19): LIMA-LAD, SVG-D, SVG-ramus, SVG-PDA.  4. Chronic systolic CHF: Ischemic cardiomyopathy.   - Echo (6/19): LV moderately dilated, EF 25-30%, mild MR.  - Cardiac MRI (6/19): EF 28%, significant viability noted.  - RHC (7/19): mean RA 8, PA 40/16 mean 26, mean PCWP 23, CI 1.7, PVR 0.83 WU - Echo (12/19): EF 40%, mid inferoseptal and mid anteroseptal akinesis, inferior hypokinesis, normal RV size and systolic function.  - Painful gynecomastia with spironolactone and eplerenone. - Echo (3/21): EF 40-45%, mild LVH, normal RV  -  Echo (5/22): EF 45-50%, normal RV.  - Echo 7/23 showed EF 50-55%, grade I DD, normal RV 5. COPD: PFTs (9/19) with moderate obstruction.  6. PAD: ABIs (3/21) with 0.59 on right, 0.8 on left.  - ABIs (5/22): stable mild disease on right and moderate on left.  - ABIs (7/23): moderate right and left disease 7. Spinal osteomyelitis: Streptococcus mitis, 8/22  SH: Married, lives in [place].  He quit smoking in 6/19.    Family History  Problem Relation Age of Onset   Heart disease Mother    Cancer Mother     Heart attack Father    ROS: All systems reviewed and negative except as per HPI.   Current Outpatient Medications  Medication Sig Dispense Refill   aspirin EC 81 MG tablet Take 1 tablet (81 mg total) by mouth daily. Swallow whole. 90 tablet 3   atorvastatin (LIPITOR) 40 MG tablet TAKE 1 TABLET(40 MG) BY MOUTH AT BEDTIME 90 tablet 3   carvedilol (COREG) 25 MG tablet TAKE 1 TABLET(25 MG) BY MOUTH TWICE DAILY 180 tablet 3   ENTRESTO 97-103 MG TAKE 1 TABLET BY MOUTH TWICE DAILY 60 tablet 11   finasteride (PROSCAR) 5 MG tablet Take 5 mg by mouth daily.     furosemide (LASIX) 20 MG tablet Take 1 tablet (20 mg total) by mouth daily. 90 tablet 3   icosapent Ethyl (VASCEPA) 1 g capsule Take 2 capsules (2 g total) by mouth 2 (two) times daily. 360 capsule 3   Loratadine (CLARITIN) 10 MG CAPS Take 10 mg by mouth as needed.     Polyethyl Glycol-Propyl Glycol 0.4-0.3 % SOLN Place 1 drop into both eyes 2 (two) times daily.     No current facility-administered medications for this encounter.   Wt Readings from Last 3 Encounters:  11/24/22 97.7 kg (215 lb 6.4 oz)  05/07/22 98.1 kg (216 lb 3.2 oz)  01/04/22 97.5 kg (215 lb)   BP 120/60   Pulse (!) 58   Wt 97.7 kg (215 lb 6.4 oz)   SpO2 92%   BMI 31.81 kg/m  Physical Exam General:  NAD. No resp difficulty HEENT: Normal Neck: Supple. No JVD. Carotids 2+ bilat; no bruits. No lymphadenopathy or thryomegaly appreciated. Cor: PMI nondisplaced. Regular rate & rhythm. No rubs, gallops or murmurs. Lungs: Clear Abdomen: Soft, nontender, nondistended. No hepatosplenomegaly. No bruits or masses. Good bowel sounds. Extremities: No cyanosis, clubbing, rash, 1+ edema 1/2 to knees.  Neuro: Alert & oriented x 3, cranial nerves grossly intact. Moves all 4 extremities w/o difficulty. Affect pleasant.  Assessment/Plan: 1. CAD: LHC in 6/19 with CTOs of LAD and RCA.  Cardiac MRI showed significant myocardial viability. He is s/p CABG x 4 in 9/19.  No chest  pain. - Continue ASA 81 and statin.  2. Chronic systolic CHF: Ischemic cardiomyopathy.  Echo in 6/19 with EF 25-20%.  Cardiac MRI in 7/19 with EF 28%, significant viability noted. RHC in 7/19 showed mildly elevated filling pressure and low cardiac output. Now s/p CABG in 9/19.  Echo post-op in 12/19 showed EF up to 40%.  3/21 echo with EF up to 40-45%, and echo in 5/22 with EF 45-50%.  Echo (7/23) EF 50-55%. NYHA class II symptoms, not significantly volume overloaded.   - Painful gynecomastia with spironolactone and eplerenone.  - Did not tolerate Farxiga due to urinary urgency.  - Continue Entresto 97/103 bid.  BMET today. - Continue Coreg 25 mg bid.  - Continue Lasix 20 mg daily.  -  I will arrange for echo at followup in 4 months.  3. COPD: He has quit smoking since 6/19.  4. Hyperlipidemia: Continue atorvastatin and Vascepa, check lipids today.     5. PAD: No claudication with history of significant CAD. ABI (7/23) showed moderately abnormal bilateral ABI.  No current claudication symptoms.  - Will refer to Dr. Kirke Corin if symptoms occur. - Will arrange for ABIs this summer.   Follow up in 4 months with echo  Marca Ancona 11/25/2022

## 2022-12-08 ENCOUNTER — Other Ambulatory Visit (HOSPITAL_COMMUNITY): Payer: Self-pay | Admitting: Cardiology

## 2022-12-08 DIAGNOSIS — I5022 Chronic systolic (congestive) heart failure: Secondary | ICD-10-CM

## 2022-12-08 DIAGNOSIS — I739 Peripheral vascular disease, unspecified: Secondary | ICD-10-CM

## 2022-12-13 DIAGNOSIS — R972 Elevated prostate specific antigen [PSA]: Secondary | ICD-10-CM | POA: Diagnosis not present

## 2022-12-15 DIAGNOSIS — E1151 Type 2 diabetes mellitus with diabetic peripheral angiopathy without gangrene: Secondary | ICD-10-CM | POA: Diagnosis not present

## 2022-12-15 DIAGNOSIS — I10 Essential (primary) hypertension: Secondary | ICD-10-CM | POA: Diagnosis not present

## 2022-12-15 DIAGNOSIS — Z125 Encounter for screening for malignant neoplasm of prostate: Secondary | ICD-10-CM | POA: Diagnosis not present

## 2022-12-15 DIAGNOSIS — R7989 Other specified abnormal findings of blood chemistry: Secondary | ICD-10-CM | POA: Diagnosis not present

## 2022-12-15 DIAGNOSIS — I700 Atherosclerosis of aorta: Secondary | ICD-10-CM | POA: Diagnosis not present

## 2022-12-15 DIAGNOSIS — E785 Hyperlipidemia, unspecified: Secondary | ICD-10-CM | POA: Diagnosis not present

## 2022-12-17 ENCOUNTER — Ambulatory Visit (HOSPITAL_COMMUNITY): Payer: Medicare HMO

## 2022-12-20 DIAGNOSIS — N401 Enlarged prostate with lower urinary tract symptoms: Secondary | ICD-10-CM | POA: Diagnosis not present

## 2022-12-20 DIAGNOSIS — R350 Frequency of micturition: Secondary | ICD-10-CM | POA: Diagnosis not present

## 2022-12-20 DIAGNOSIS — R3912 Poor urinary stream: Secondary | ICD-10-CM | POA: Diagnosis not present

## 2022-12-20 DIAGNOSIS — R972 Elevated prostate specific antigen [PSA]: Secondary | ICD-10-CM | POA: Diagnosis not present

## 2022-12-22 DIAGNOSIS — Z1331 Encounter for screening for depression: Secondary | ICD-10-CM | POA: Diagnosis not present

## 2022-12-22 DIAGNOSIS — I700 Atherosclerosis of aorta: Secondary | ICD-10-CM | POA: Diagnosis not present

## 2022-12-22 DIAGNOSIS — Z1339 Encounter for screening examination for other mental health and behavioral disorders: Secondary | ICD-10-CM | POA: Diagnosis not present

## 2022-12-22 DIAGNOSIS — I5022 Chronic systolic (congestive) heart failure: Secondary | ICD-10-CM | POA: Diagnosis not present

## 2022-12-22 DIAGNOSIS — N1831 Chronic kidney disease, stage 3a: Secondary | ICD-10-CM | POA: Diagnosis not present

## 2022-12-22 DIAGNOSIS — I25810 Atherosclerosis of coronary artery bypass graft(s) without angina pectoris: Secondary | ICD-10-CM | POA: Diagnosis not present

## 2022-12-22 DIAGNOSIS — E1151 Type 2 diabetes mellitus with diabetic peripheral angiopathy without gangrene: Secondary | ICD-10-CM | POA: Diagnosis not present

## 2022-12-22 DIAGNOSIS — Z0000 Encounter for general adult medical examination without abnormal findings: Secondary | ICD-10-CM | POA: Diagnosis not present

## 2022-12-22 DIAGNOSIS — I130 Hypertensive heart and chronic kidney disease with heart failure and stage 1 through stage 4 chronic kidney disease, or unspecified chronic kidney disease: Secondary | ICD-10-CM | POA: Diagnosis not present

## 2022-12-22 DIAGNOSIS — Z1212 Encounter for screening for malignant neoplasm of rectum: Secondary | ICD-10-CM | POA: Diagnosis not present

## 2022-12-22 DIAGNOSIS — E785 Hyperlipidemia, unspecified: Secondary | ICD-10-CM | POA: Diagnosis not present

## 2022-12-22 DIAGNOSIS — R82998 Other abnormal findings in urine: Secondary | ICD-10-CM | POA: Diagnosis not present

## 2022-12-23 ENCOUNTER — Other Ambulatory Visit (HOSPITAL_COMMUNITY): Payer: Self-pay | Admitting: Cardiology

## 2022-12-23 DIAGNOSIS — I739 Peripheral vascular disease, unspecified: Secondary | ICD-10-CM

## 2023-01-05 ENCOUNTER — Ambulatory Visit (HOSPITAL_COMMUNITY)
Admission: RE | Admit: 2023-01-05 | Discharge: 2023-01-05 | Disposition: A | Discharge status: Home / Self Care | Payer: Medicare HMO | Source: Ambulatory Visit | Attending: Cardiovascular Disease | Admitting: Cardiovascular Disease

## 2023-01-05 DIAGNOSIS — I739 Peripheral vascular disease, unspecified: Secondary | ICD-10-CM | POA: Diagnosis not present

## 2023-02-03 ENCOUNTER — Other Ambulatory Visit (HOSPITAL_COMMUNITY): Payer: Self-pay | Admitting: Cardiology

## 2023-02-04 IMAGING — CT CT CHEST W/ CM
1 series · 15 of 34 positions shown, 19 images · IV contrast (APPLIED)
Comparison: None.

CLINICAL DATA: Cough, chest pain, nonsmoker, COVID August 2020

EXAM:
CT CHEST WITH CONTRAST
TECHNIQUE: Multidetector CT imaging of the chest was performed during
intravenous contrast administration.
CONTRAST:  50mL [id number]-OHH IOPAMIDOL ([id number]-OHH) INJECTION 61%

[Series 2: chest w/cm · axial · 0.82mm/px · z∈[-322,-50]mm · 15 of 160 slices shown, 19 images]
[im 12/160  mediastinal]
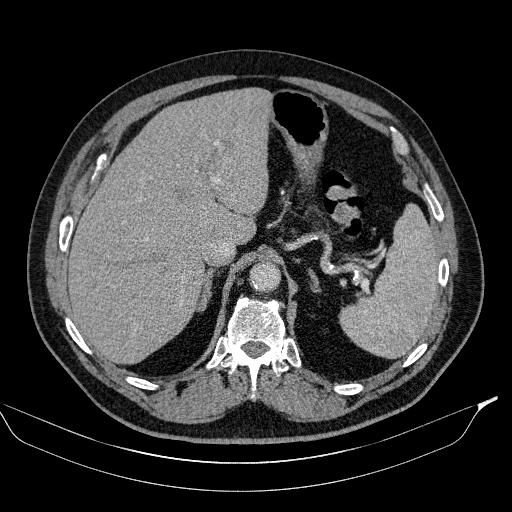
[im 12/160  lung]
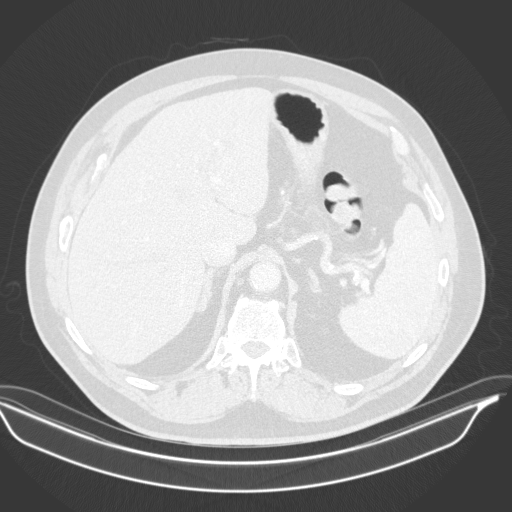
[im 24/160  lung]
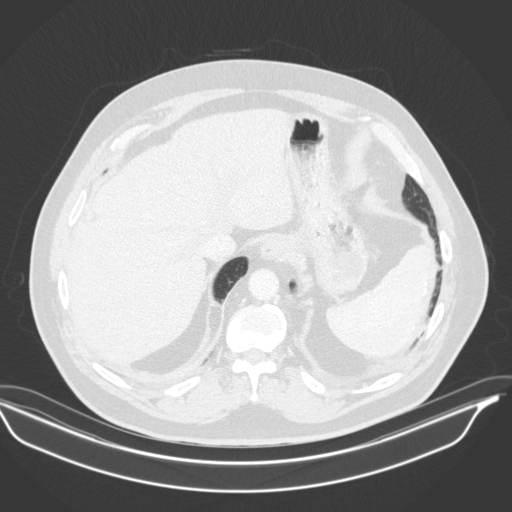
[im 32/160  lung]
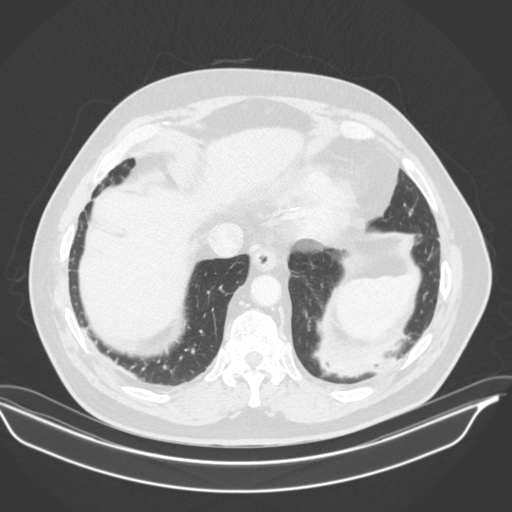
[im 42/160  lung]
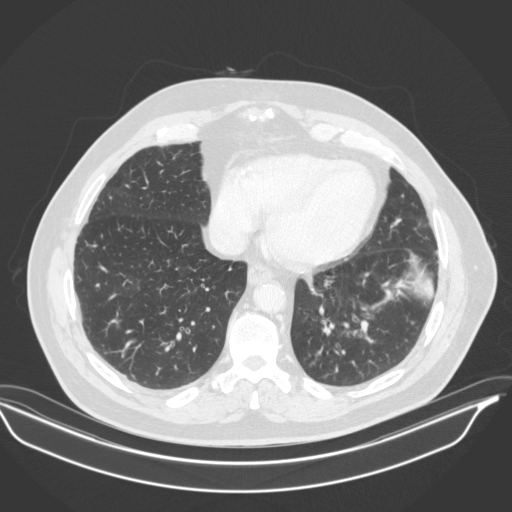
[im 54/160  mediastinal]
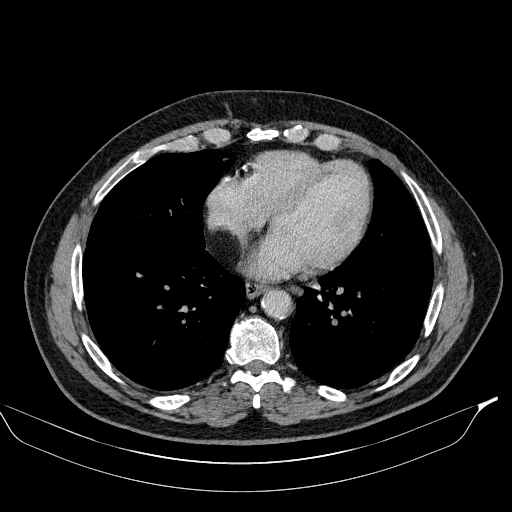
[im 54/160  lung]
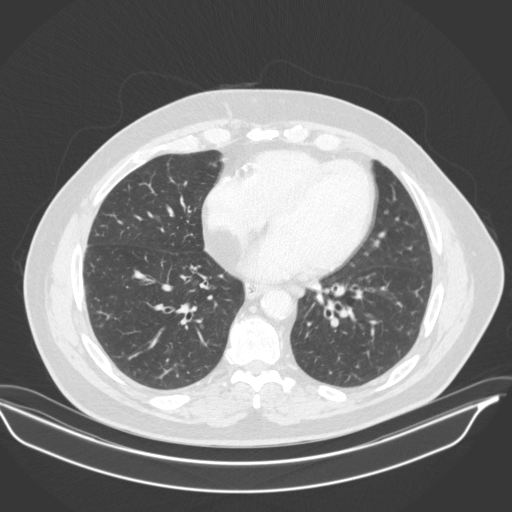
[im 64/160  lung]
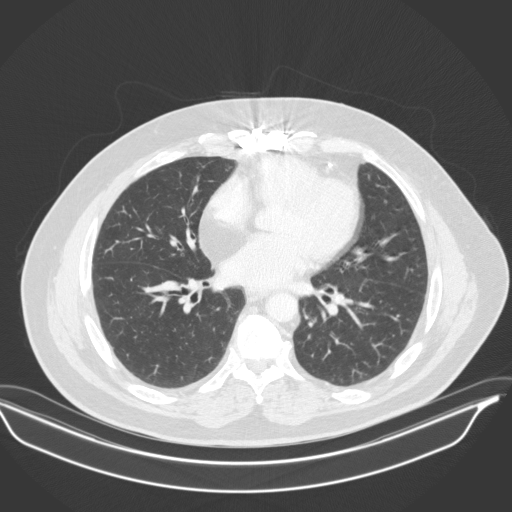
[im 71/160  lung]
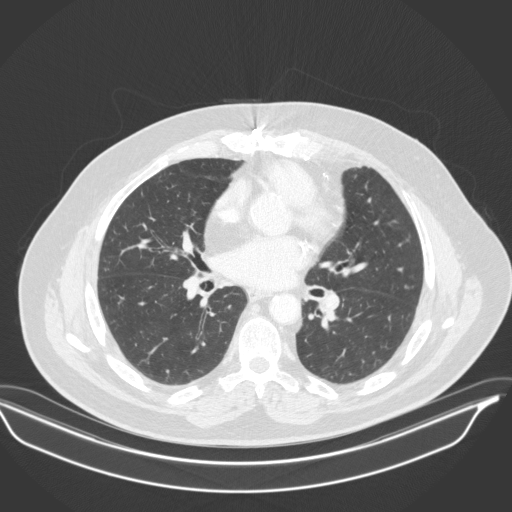
[im 83/160  lung]
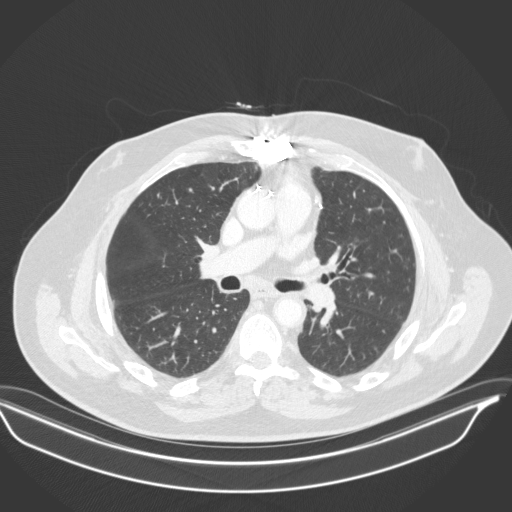
[im 89/160  mediastinal]
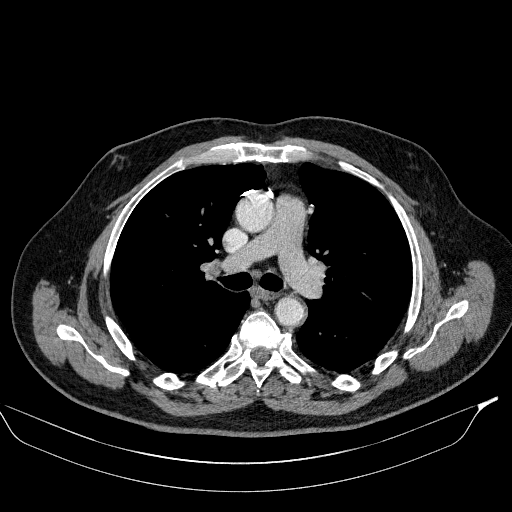
[im 89/160  lung]
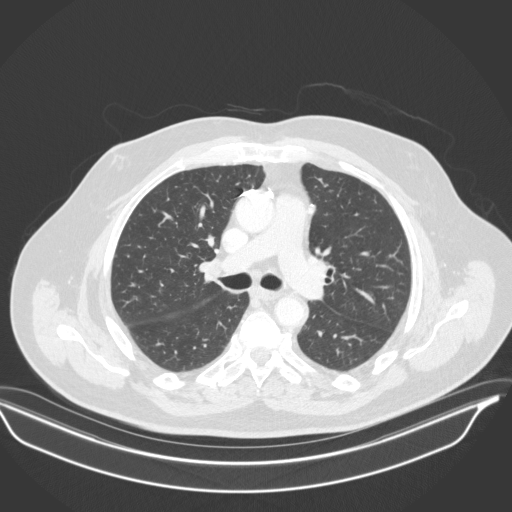
[im 96/160  lung]
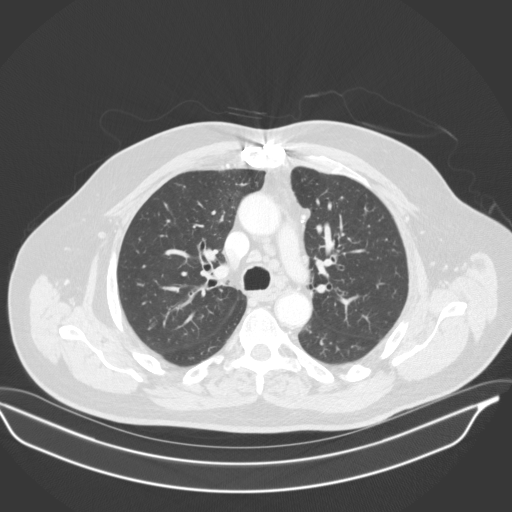
[im 107/160  lung]
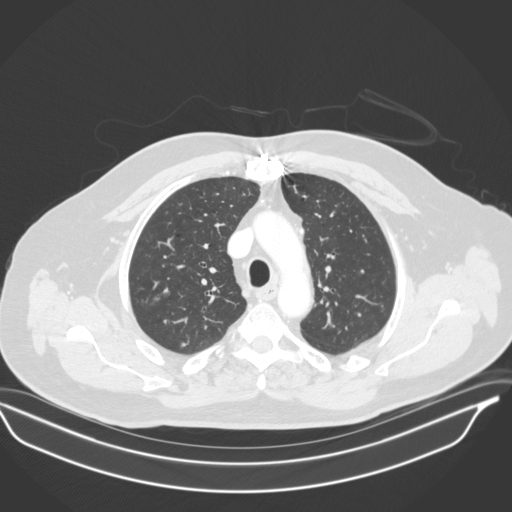
[im 118/160  lung]
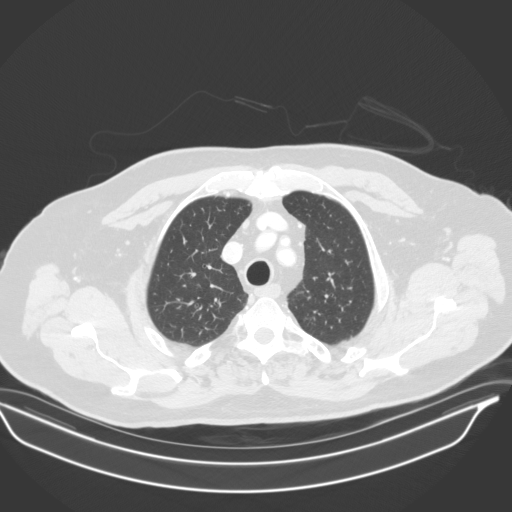
[im 128/160  mediastinal]
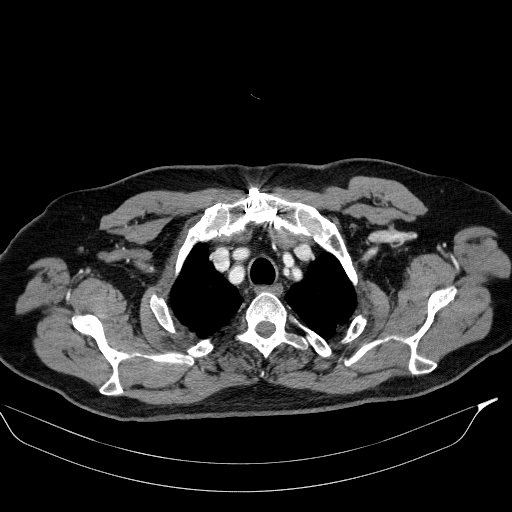
[im 128/160  lung]
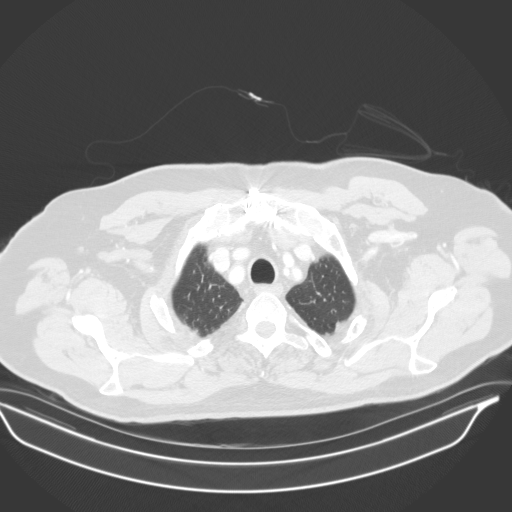
[im 136/160  lung]
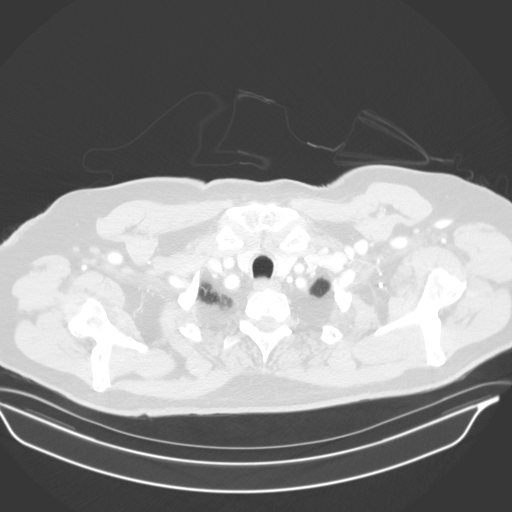
[im 148/160  lung]
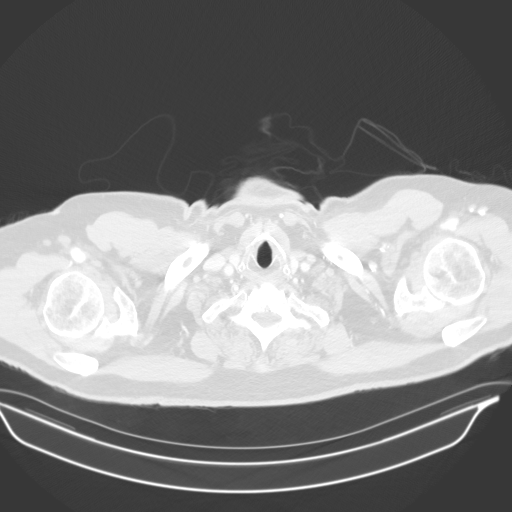

[15 of 34 positions shown; findings below may reference images not displayed]

FINDINGS: Cardiovascular: Aortic atherosclerosis. Normal heart size.
Three-vessel coronary artery calcifications. Status post median
sternotomy and CABG. Lipomatous hypertrophy of the intra-atrial
septum (series 2, image 97). No pericardial effusion.

Mediastinum/Nodes: Prominent mediastinal lymph nodes (series 2,
image 64) thyroid gland, trachea, and esophagus demonstrate no
significant findings.

Lungs/Pleura: Diffuse bilateral bronchial wall thickening. Bandlike
atelectasis or consolidation of the left lung base (series 3, image
129). There are numerous small pulmonary nodules scattered
throughout the upper lobes, measuring 3 mm or smaller (e.g. Series
3, image 54). No pleural effusion or pneumothorax.

Upper Abdomen: No acute abnormality.

Musculoskeletal: No chest wall mass or suspicious bone lesions
identified.
IMPRESSION: 1. Diffuse bilateral bronchial wall thickening, consistent with
nonspecific infectious or inflammatory bronchitis.
2. Bandlike atelectasis or consolidation of the left lung base.
3. There are numerous small pulmonary nodules scattered throughout
the upper lobes, measuring 3 mm or smaller, nonspecific and
infectious or inflammatory.
4. Coronary artery disease.

Aortic Atherosclerosis ([id number]).

## 2023-02-07 DIAGNOSIS — H26491 Other secondary cataract, right eye: Secondary | ICD-10-CM | POA: Diagnosis not present

## 2023-02-07 DIAGNOSIS — H35373 Puckering of macula, bilateral: Secondary | ICD-10-CM | POA: Diagnosis not present

## 2023-02-07 DIAGNOSIS — H35343 Macular cyst, hole, or pseudohole, bilateral: Secondary | ICD-10-CM | POA: Diagnosis not present

## 2023-02-07 DIAGNOSIS — H524 Presbyopia: Secondary | ICD-10-CM | POA: Diagnosis not present

## 2023-03-05 ENCOUNTER — Other Ambulatory Visit: Payer: Self-pay | Admitting: Cardiology

## 2023-03-28 ENCOUNTER — Ambulatory Visit (HOSPITAL_BASED_OUTPATIENT_CLINIC_OR_DEPARTMENT_OTHER)
Admission: RE | Admit: 2023-03-28 | Discharge: 2023-03-28 | Disposition: A | Discharge status: Home / Self Care | Payer: Medicare HMO | Source: Ambulatory Visit | Attending: Cardiology | Admitting: Cardiology

## 2023-03-28 ENCOUNTER — Encounter (HOSPITAL_COMMUNITY): Payer: Self-pay | Admitting: Cardiology

## 2023-03-28 ENCOUNTER — Ambulatory Visit (HOSPITAL_COMMUNITY)
Admission: RE | Admit: 2023-03-28 | Discharge: 2023-03-28 | Disposition: A | Discharge status: Home / Self Care | Payer: Medicare HMO | Source: Ambulatory Visit | Attending: Cardiology | Admitting: Cardiology

## 2023-03-28 VITALS — BP 124/60 | HR 58 | Wt 218.0 lb

## 2023-03-28 DIAGNOSIS — I110 Hypertensive heart disease with heart failure: Secondary | ICD-10-CM | POA: Diagnosis not present

## 2023-03-28 DIAGNOSIS — Z951 Presence of aortocoronary bypass graft: Secondary | ICD-10-CM | POA: Diagnosis not present

## 2023-03-28 DIAGNOSIS — I739 Peripheral vascular disease, unspecified: Secondary | ICD-10-CM | POA: Diagnosis not present

## 2023-03-28 DIAGNOSIS — J449 Chronic obstructive pulmonary disease, unspecified: Secondary | ICD-10-CM | POA: Insufficient documentation

## 2023-03-28 DIAGNOSIS — Z7982 Long term (current) use of aspirin: Secondary | ICD-10-CM | POA: Insufficient documentation

## 2023-03-28 DIAGNOSIS — I255 Ischemic cardiomyopathy: Secondary | ICD-10-CM | POA: Insufficient documentation

## 2023-03-28 DIAGNOSIS — Z87891 Personal history of nicotine dependence: Secondary | ICD-10-CM | POA: Insufficient documentation

## 2023-03-28 DIAGNOSIS — R9431 Abnormal electrocardiogram [ECG] [EKG]: Secondary | ICD-10-CM | POA: Diagnosis not present

## 2023-03-28 DIAGNOSIS — I5022 Chronic systolic (congestive) heart failure: Secondary | ICD-10-CM

## 2023-03-28 DIAGNOSIS — I2510 Atherosclerotic heart disease of native coronary artery without angina pectoris: Secondary | ICD-10-CM | POA: Diagnosis not present

## 2023-03-28 DIAGNOSIS — I447 Left bundle-branch block, unspecified: Secondary | ICD-10-CM | POA: Diagnosis not present

## 2023-03-28 DIAGNOSIS — E785 Hyperlipidemia, unspecified: Secondary | ICD-10-CM | POA: Insufficient documentation

## 2023-03-28 LAB — ECHOCARDIOGRAM COMPLETE
AR max vel: 3.19 cm2
AV Area VTI: 3.08 cm2
AV Area mean vel: 2.91 cm2
AV Mean grad: 2 mmHg
AV Peak grad: 3.8 mmHg
Ao pk vel: 0.98 m/s
Area-P 1/2: 3.12 cm2
Est EF: 50
S' Lateral: 4 cm

## 2023-03-28 LAB — BRAIN NATRIURETIC PEPTIDE: B Natriuretic Peptide: 123.2 pg/mL — ABNORMAL HIGH (ref 0.0–100.0)

## 2023-03-28 LAB — BASIC METABOLIC PANEL
Anion gap: 9 (ref 5–15)
BUN: 14 mg/dL (ref 8–23)
CO2: 28 mmol/L (ref 22–32)
Calcium: 8.6 mg/dL — ABNORMAL LOW (ref 8.9–10.3)
Chloride: 104 mmol/L (ref 98–111)
Creatinine, Ser: 1.15 mg/dL (ref 0.61–1.24)
GFR, Estimated: >60 mL/min (ref >60)
Glucose, Bld: 88 mg/dL (ref 70–99)
Potassium: 4.1 mmol/L (ref 3.5–5.1)
Sodium: 141 mmol/L (ref 135–145)

## 2023-03-28 NOTE — Patient Instructions (Addendum)
Good to see you today!   No medication changes   Labs done today, your results will be available in MyChart, we will contact you for abnormal readings.  Your physician recommends that you schedule a follow-up appointment in: as schedule   If you have any questions or concerns before your next appointment please send [place] a message through [place] or call our office at [phone].    TO LEAVE A MESSAGE FOR THE NURSE SELECT OPTION 2, PLEASE LEAVE A MESSAGE INCLUDING: YOUR NAME DATE OF BIRTH CALL BACK NUMBER REASON FOR CALL**this is important as we prioritize the call backs  YOU WILL RECEIVE A CALL BACK THE SAME DAY AS LONG AS YOU CALL BEFORE 4:00 PM.  At the [facility], you and your health needs are our priority. As part of our continuing mission to provide you with exceptional heart care, we have created designated Provider Care Teams. These Care Teams include your primary Cardiologist (physician) and Advanced Practice Providers (APPs- Physician Assistants and Nurse Practitioners) who all work together to provide you with the care you need, when you need it.   You may see any of the following providers on your designated Care Team at your next follow up: Dr Arvilla Meres Dr Marca Ancona Dr. Marcos Eke, NP Robbie Lis, [place] [facility] [place], [place] Brynda Peon, NP Karle Plumber, PharmD   Please be sure to bring in all your medications bottles to every appointment.    Thank you for choosing [facility]

## 2023-03-28 NOTE — Progress Notes (Signed)
*  PRELIMINARY RESULTS* Echocardiogram 2D Echocardiogram has been performed.  Earlie Server Doranne Schmutz 03/28/2023, 2:25 PM

## 2023-03-29 NOTE — Progress Notes (Signed)
HF Cardiology: Dr. Shirlee Latch PCP: Dr. Evlyn Kanner  82 y.o. with history of CAD and ischemic cardiomyopathy presents for followup of CHF. Patient had no cardiac history prior to 6/19. However, since around 12/18, he had noted exertional dyspnea.  In 6/19, [place]  He developed very severe dyspnea and went to the hospital in [place] where he was found to have NSTEMI.  LHC was done, showing chronic occlusions of the LAD and RCA with collaterals.  Echo sh//owed EF 25-30%.  No intervention, he was eventually discharged to come home.  He has been seen by Dr. Donata Clay for CABG evaluation.  Cardiac MRI in 6/19 showed EF 28% with substantial viability.  RHC was done in 7/19, showing mildly elevated filling pressures but low cardiac output, with CI 1.7.  After RHC, I increased his Lasix and added digoxin.    Of note, he has quit smoking since 6/19.   He was admitted for CABG in 9/19.  Volume overload post-op, treated with diuresis.    Echo in 12/19 showed EF 40%, mid anteroseptal and mid inferoseptal akinesis.   He had painful gynecomastia with spironolactone and switched to eplerenone but did not resolve.  Now off eplerenone with resolution of symptoms.   Echo in 3/21 showed EF 40-45%, mild LVH, normal RV.   ABIs in 3/21 showed moderate PAD, he saw Dr. Gery Pray with plan for conservative management for now.   He had COVID-19 in 2/22.   Echo in 5/22 showed EF up to 45-50%, normal RV.  ABIs in 5/22 with unchanged mild disease on right and moderate on left.   Spinal osteomyelitis in 8/22 with Strep mitis.   Echo 7/23 showed EF 50-55%, grade I DD, normal RV.  Echo was done today and reviewed, EF 50% with basal inferior/basal to mid inferoseptal severe hypokinesis, mildly decreased RV systolic function.   Today he returns for HF follow up. Weight is up 3 lb.  Still working 2 days/week.  Uses recumbent bike on days he is not working.  He denies claudication though ABIs moderately  abnormal.  He gets short of breath "hurrying" or bending over.  No chest pain.  No orthopnea/PND.  No lightheadedness or palpitations. Has a chronic cough that is likely related to COPD.    ECG (personally reviewed): NSR, anteroseptal Qs, inferior ST changes.   Labs (7/19): hgb 14.1, K 4.1, creatinine 1.26, LDL 28 Labs (9/19): K 3.7, creatinine 1.09, LFTs normal, hgb 12.6 Labs (11/19): K 4.4, creatinine 1.31 Labs (12/19): K 4.4, creatinine 1.19 Labs (3/20): K 4.5, creatinine 1.12 Labs (5/20): LDL 41, HDL 27, TGs 176  Labs (8/20): K 4.9, creatinine 1.12 Labs (3/21): K 4.5, creatinine 1.14 Labs (6/21): K 4.2, creatinine 1.13, LDL 35, TGs 142 Labs (9/21): K 4.3, creatinine 1.12 Labs (2/22): BNP 301, K 4.2, creatinine 1.15 Labs (5/22): TGs 75, LDL 26 Labs (8/22): K 3.7,creatinine 0.8 Labs (9/22): K 4.5, creatinine 0.92 Labs (6/23): K 5, creatinine 1.1, LDL 51, TGs 97 Labs (2/23): K 4.3, creatinine 1.17, LDL 36, HDL 37 Labs (11/23): K 4.4, creatinine 1.1 Labs (5/24): LDL 41, BNP 139, K 4, creatinine 1.1  PMH: 1. H/o retinal detachment 2. H/o transverse myelitis (remote) 3. CAD: NSTEMI 6/19 in [place]. - LHC (6/19): Chronic total occlusion of LAD and chronic total occlusion of RCA with collaterals.  - CABG (9/19): LIMA-LAD, SVG-D, SVG-ramus, SVG-PDA.  4. Chronic systolic CHF: Ischemic cardiomyopathy.   - Echo (6/19): LV moderately dilated, EF 25-30%,  mild MR.  - Cardiac MRI (6/19): EF 28%, significant viability noted.  - RHC (7/19): mean RA 8, PA 40/16 mean 26, mean PCWP 23, CI 1.7, PVR 0.83 WU - Echo (12/19): EF 40%, mid inferoseptal and mid anteroseptal akinesis, inferior hypokinesis, normal RV size and systolic function.  - Painful gynecomastia with spironolactone and eplerenone. - Echo (3/21): EF 40-45%, mild LVH, normal RV  - Echo (5/22): EF 45-50%, normal RV.  - Echo 7/23 showed EF 50-55%, grade I DD, normal RV - Echo (9/24): EF 50% with basal inferior/basal to mid  inferoseptal severe hypokinesis, mildly decreased RV systolic function.  5. COPD: PFTs (9/19) with moderate obstruction.  6. PAD: ABIs (3/21) with 0.59 on right, 0.8 on left.  - ABIs (5/22): stable mild disease on right and moderate on left.  - ABIs (7/23): moderate right and left disease - ABIs (7/24): 0.67 right, 0.75 left 7. Spinal osteomyelitis: Streptococcus mitis, 8/22  SH: Married, lives in [place].  He quit smoking in 6/19.    Family History  Problem Relation Age of Onset   Heart disease Mother    Cancer Mother    Heart attack Father    ROS: All systems reviewed and negative except as per HPI.   Current Outpatient Medications  Medication Sig Dispense Refill   aspirin EC 81 MG tablet Take 1 tablet (81 mg total) by mouth daily. Swallow whole. 90 tablet 3   atorvastatin (LIPITOR) 40 MG tablet TAKE 1 TABLET(40 MG) BY MOUTH AT BEDTIME 90 tablet 3   carvedilol (COREG) 25 MG tablet TAKE 1 TABLET(25 MG) BY MOUTH TWICE DAILY 180 tablet 3   ENTRESTO 97-103 MG TAKE 1 TABLET BY MOUTH TWICE DAILY 60 tablet 11   finasteride (PROSCAR) 5 MG tablet Take 5 mg by mouth daily.     furosemide (LASIX) 20 MG tablet Take 1 tablet (20 mg total) by mouth daily. 90 tablet 3   icosapent Ethyl (VASCEPA) 1 g capsule Take 2 capsules (2 g total) by mouth 2 (two) times daily. 360 capsule 3   Loratadine (CLARITIN) 10 MG CAPS Take 10 mg by mouth as needed.     Polyethyl Glycol-Propyl Glycol 0.4-0.3 % SOLN Place 1 drop into both eyes 2 (two) times daily.     No current facility-administered medications for this encounter.   Wt Readings from Last 3 Encounters:  03/28/23 98.9 kg (218 lb)  11/24/22 97.7 kg (215 lb 6.4 oz)  05/07/22 98.1 kg (216 lb 3.2 oz)   BP 124/60   Pulse (!) 58   Wt 98.9 kg (218 lb)   SpO2 93%   BMI 32.19 kg/m  General: NAD Neck: No JVD, no thyromegaly or thyroid nodule.  Lungs: Distant BS CV: Nondisplaced PMI.  Heart regular S1/S2, no S3/S4, no murmur.  1+ ankle edema  bilaterally.  No carotid bruit.  Unable to palpate pedal pulses.  Abdomen: Soft, nontender, no hepatosplenomegaly, no distention.  Skin: Intact without lesions or rashes.  Neurologic: Alert and oriented x 3.  Psych: Normal affect. Extremities: No clubbing or cyanosis.  HEENT: Normal.   Assessment/Plan: 1. CAD: LHC in 6/19 with CTOs of LAD and RCA.  Cardiac MRI showed significant myocardial viability. He is s/p CABG x 4 in 9/19.  No chest pain. - Continue ASA 81 and statin.  2. Chronic systolic CHF: Ischemic cardiomyopathy.  Echo in 6/19 with EF 25-20%.  Cardiac MRI in 7/19 with EF 28%, significant viability noted. RHC in 7/19 showed mildly elevated filling pressure  and low cardiac output. Now s/p CABG in 9/19.  Echo post-op in 12/19 showed EF up to 40%.  3/21 echo with EF up to 40-45%, and echo in 5/22 with EF 45-50%.  Echo today with EF 50% with basal inferior/basal to mid inferoseptal severe hypokinesis, mildly decreased RV systolic function. NYHA class II symptoms, not significantly volume overloaded on exam.    - Painful gynecomastia with spironolactone and eplerenone.  - Did not tolerate Farxiga due to urinary urgency.  - Continue Entresto 97/103 bid.  BMET/BNP today. - Continue Coreg 25 mg bid.  - Continue Lasix 20 mg daily, suspect ankle edema is venous insufficiency.  3. COPD: He has quit smoking since 6/19.  Has chronic cough.  4. Hyperlipidemia: Continue atorvastatin and Vascepa, good lipids in 5/24.     5. PAD: No claudication with history of significant CAD. ABI (7/24) showed moderately abnormal bilateral ABI.  No current claudication symptoms or rest pain.  - Will refer to Dr. Kirke Corin if symptoms occur.   Follow up in 4 months with APP  Marca Ancona 03/29/2023

## 2023-06-06 ENCOUNTER — Encounter (INDEPENDENT_AMBULATORY_CARE_PROVIDER_SITE_OTHER): Payer: Medicare HMO | Admitting: Ophthalmology

## 2023-06-06 DIAGNOSIS — H338 Other retinal detachments: Secondary | ICD-10-CM | POA: Diagnosis not present

## 2023-06-06 DIAGNOSIS — H35343 Macular cyst, hole, or pseudohole, bilateral: Secondary | ICD-10-CM

## 2023-06-08 ENCOUNTER — Other Ambulatory Visit (HOSPITAL_COMMUNITY): Payer: Self-pay | Admitting: Cardiology

## 2023-06-08 DIAGNOSIS — E785 Hyperlipidemia, unspecified: Secondary | ICD-10-CM | POA: Diagnosis not present

## 2023-06-08 DIAGNOSIS — E1151 Type 2 diabetes mellitus with diabetic peripheral angiopathy without gangrene: Secondary | ICD-10-CM | POA: Diagnosis not present

## 2023-06-08 DIAGNOSIS — I5022 Chronic systolic (congestive) heart failure: Secondary | ICD-10-CM | POA: Diagnosis not present

## 2023-06-08 DIAGNOSIS — I130 Hypertensive heart and chronic kidney disease with heart failure and stage 1 through stage 4 chronic kidney disease, or unspecified chronic kidney disease: Secondary | ICD-10-CM | POA: Diagnosis not present

## 2023-06-08 DIAGNOSIS — N1831 Chronic kidney disease, stage 3a: Secondary | ICD-10-CM | POA: Diagnosis not present

## 2023-06-08 DIAGNOSIS — I25810 Atherosclerosis of coronary artery bypass graft(s) without angina pectoris: Secondary | ICD-10-CM | POA: Diagnosis not present

## 2023-06-08 DIAGNOSIS — I700 Atherosclerosis of aorta: Secondary | ICD-10-CM | POA: Diagnosis not present

## 2023-06-08 DIAGNOSIS — J449 Chronic obstructive pulmonary disease, unspecified: Secondary | ICD-10-CM | POA: Diagnosis not present

## 2023-07-07 ENCOUNTER — Other Ambulatory Visit (HOSPITAL_COMMUNITY): Payer: Self-pay | Admitting: Cardiology

## 2023-07-27 ENCOUNTER — Encounter (HOSPITAL_COMMUNITY): Payer: Medicare HMO

## 2023-08-09 DIAGNOSIS — R350 Frequency of micturition: Secondary | ICD-10-CM | POA: Diagnosis not present

## 2023-08-09 DIAGNOSIS — R972 Elevated prostate specific antigen [PSA]: Secondary | ICD-10-CM | POA: Diagnosis not present

## 2023-08-09 DIAGNOSIS — N401 Enlarged prostate with lower urinary tract symptoms: Secondary | ICD-10-CM | POA: Diagnosis not present

## 2023-08-11 ENCOUNTER — Telehealth (HOSPITAL_COMMUNITY): Payer: Self-pay

## 2023-08-11 NOTE — Telephone Encounter (Signed)
 Called to confirm/remind patient of their appointment at the [facility] on 08/12/23.   Patient reminded to bring all medications and/or complete list.  Confirmed patient has transportation. Gave directions, instructed to utilize valet parking.  Confirmed appointment prior to ending call.

## 2023-08-12 ENCOUNTER — Encounter (HOSPITAL_COMMUNITY): Payer: Self-pay

## 2023-08-12 ENCOUNTER — Ambulatory Visit (HOSPITAL_COMMUNITY)
Admission: RE | Admit: 2023-08-12 | Discharge: 2023-08-12 | Disposition: A | Discharge status: Home / Self Care | Payer: Medicare HMO | Source: Ambulatory Visit | Attending: Family Medicine

## 2023-08-12 VITALS — BP 128/56 | HR 57 | Wt 217.0 lb

## 2023-08-12 DIAGNOSIS — Z79899 Other long term (current) drug therapy: Secondary | ICD-10-CM | POA: Diagnosis not present

## 2023-08-12 DIAGNOSIS — I5022 Chronic systolic (congestive) heart failure: Secondary | ICD-10-CM | POA: Diagnosis not present

## 2023-08-12 DIAGNOSIS — J449 Chronic obstructive pulmonary disease, unspecified: Secondary | ICD-10-CM | POA: Diagnosis not present

## 2023-08-12 DIAGNOSIS — I255 Ischemic cardiomyopathy: Secondary | ICD-10-CM | POA: Diagnosis not present

## 2023-08-12 DIAGNOSIS — E785 Hyperlipidemia, unspecified: Secondary | ICD-10-CM | POA: Insufficient documentation

## 2023-08-12 DIAGNOSIS — Z951 Presence of aortocoronary bypass graft: Secondary | ICD-10-CM | POA: Insufficient documentation

## 2023-08-12 DIAGNOSIS — I252 Old myocardial infarction: Secondary | ICD-10-CM | POA: Diagnosis not present

## 2023-08-12 DIAGNOSIS — I2510 Atherosclerotic heart disease of native coronary artery without angina pectoris: Secondary | ICD-10-CM | POA: Diagnosis not present

## 2023-08-12 DIAGNOSIS — Z87891 Personal history of nicotine dependence: Secondary | ICD-10-CM | POA: Diagnosis not present

## 2023-08-12 DIAGNOSIS — R600 Localized edema: Secondary | ICD-10-CM | POA: Insufficient documentation

## 2023-08-12 DIAGNOSIS — I739 Peripheral vascular disease, unspecified: Secondary | ICD-10-CM

## 2023-08-12 DIAGNOSIS — R0602 Shortness of breath: Secondary | ICD-10-CM | POA: Insufficient documentation

## 2023-08-12 LAB — BASIC METABOLIC PANEL
Anion gap: 9 (ref 5–15)
BUN: 17 mg/dL (ref 8–23)
CO2: 28 mmol/L (ref 22–32)
Calcium: 9 mg/dL (ref 8.9–10.3)
Chloride: 102 mmol/L (ref 98–111)
Creatinine, Ser: 1.11 mg/dL (ref 0.61–1.24)
GFR, Estimated: >60 mL/min (ref >60)
Glucose, Bld: 73 mg/dL (ref 70–99)
Potassium: 4.5 mmol/L (ref 3.5–5.1)
Sodium: 139 mmol/L (ref 135–145)

## 2023-08-12 LAB — BRAIN NATRIURETIC PEPTIDE: B Natriuretic Peptide: 182.2 pg/mL — ABNORMAL HIGH (ref 0.0–100.0)

## 2023-08-12 NOTE — Progress Notes (Signed)
 ReDS Vest / Clip - 08/12/23 1200       ReDS Vest / Clip   Station Marker D    Ruler Value 32    ReDS Value Range Low volume    ReDS Actual Value 32

## 2023-08-12 NOTE — Patient Instructions (Addendum)
Thank you for coming in today  If you had labs drawn today, any labs that are abnormal the clinic will call you No news is good news  Medications: No changes  Follow up appointments:  Your physician recommends that you schedule a follow-up appointment in:  4 months With Dr. Earlean Shawl will receive a reminder letter in the mail a few months in advance. If you don't receive a letter, please call our office to schedule the follow-up appointment.    Do the following things EVERYDAY: Weigh yourself in the morning before breakfast. Write it down and keep it in a log. Take your medicines as prescribed Eat low salt foods--Limit salt (sodium) to 2000 mg per day.  Stay as active as you can everyday Limit all fluids for the day to less than 2 liters   At the [facility], you and your health needs are our priority. As part of our continuing mission to provide you with exceptional heart care, we have created designated Provider Care Teams. These Care Teams include your primary Cardiologist (physician) and Advanced Practice Providers (APPs- Physician Assistants and Nurse Practitioners) who all work together to provide you with the care you need, when you need it.   You may see any of the following providers on your designated Care Team at your next follow up: Dr Arvilla Meres Dr Marca Ancona Dr. Marcos Eke, NP Robbie Lis, [place] [facility] [place], [place] Brynda Peon, NP Karle Plumber, PharmD   Please be sure to bring in all your medications bottles to every appointment.    Thank you for choosing [facility]  If you have any questions or concerns before your next appointment please send [place] a message through [place] or call our office at [phone].    TO LEAVE A MESSAGE FOR THE NURSE SELECT OPTION 2, PLEASE LEAVE A MESSAGE INCLUDING: YOUR NAME DATE OF BIRTH CALL BACK NUMBER REASON FOR CALL**this  is important as we prioritize the call backs  YOU WILL RECEIVE A CALL BACK THE SAME DAY AS LONG AS YOU CALL BEFORE 4:00 PM

## 2023-08-12 NOTE — Progress Notes (Addendum)
 HF Cardiology: Dr. Rolan PCP: Dr. Nichole  83 y.o. with history of CAD and ischemic cardiomyopathy presents for followup of CHF. Patient had no cardiac history prior to 6/19. However, since around 12/18, he had noted exertional dyspnea.  In 6/19, [place]  He developed very severe dyspnea and went to the hospital in [place] where he was found to have NSTEMI.  LHC was done, showing chronic occlusions of the LAD and RCA with collaterals.  Echo sh//owed EF 25-30%.  No intervention, he was eventually discharged to come home.  He has been seen by Dr. Fleeta Ochoa for CABG evaluation.  Cardiac MRI in 6/19 showed EF 28% with substantial viability.  RHC was done in 7/19, showing mildly elevated filling pressures but low cardiac output, with CI 1.7.  After RHC, I increased his Lasix  and added digoxin .    Of note, he has quit smoking since 6/19.   He was admitted for CABG in 9/19.  Volume overload post-op, treated with diuresis.    Echo in 12/19 showed EF 40%, mid anteroseptal and mid inferoseptal akinesis.   He had painful gynecomastia with spironolactone  and switched to eplerenone  but did not resolve.  Now off eplerenone  with resolution of symptoms.   Echo in 3/21 showed EF 40-45%, mild LVH, normal RV.   ABIs in 3/21 showed moderate PAD, he saw Dr. Wadie with plan for conservative management for now.   He had COVID-19 in 2/22.   Echo in 5/22 showed EF up to 45-50%, normal RV.  ABIs in 5/22 with unchanged mild disease on right and moderate on left.   Spinal osteomyelitis in 8/22 with Strep mitis.   Echo 7/23 showed EF 50-55%, grade I DD, normal RV.  Echo 9/24 showed EF 50% with basal inferior/basal to mid inferoseptal severe hypokinesis, mildly decreased RV systolic function.   Today he returns for HF follow up. Overall feeling fine. He has SOB bending over or if he hurries. Has LLE edema, not wearing compression hose. Denies palpitations, abnormal bleeding, CP,  dizziness, or PND/Orthopnea. Appetite ok. No fever or chills. Weight at home 217 pounds. Taking all medications. Works 2 days a week at [employer].  ReDs reading: 32 %, normal  ECG (personally reviewed): none ordered today  Labs (6/23): K 5, creatinine 1.1, LDL 51, TGs 97 Labs (2/23): K 4.3, creatinine 1.17, LDL 36, HDL 37 Labs (11/23): K 4.4, creatinine 1.1 Labs (5/24): LDL 41, BNP 139, K 4, creatinine 1.1 Labs (9/24): K 4.1, creatinine 1.15  PMH: 1. H/o retinal detachment 2. H/o transverse myelitis (remote) 3. CAD: NSTEMI 6/19 in [place]. - LHC (6/19): Chronic total occlusion of LAD and chronic total occlusion of RCA with collaterals.  - CABG (9/19): LIMA-LAD, SVG-D, SVG-ramus, SVG-PDA.  4. Chronic systolic CHF: Ischemic cardiomyopathy.   - Echo (6/19): LV moderately dilated, EF 25-30%, mild MR.  - Cardiac MRI (6/19): EF 28%, significant viability noted.  - RHC (7/19): mean RA 8, PA 40/16 mean 26, mean PCWP 23, CI 1.7, PVR 0.83 WU - Echo (12/19): EF 40%, mid inferoseptal and mid anteroseptal akinesis, inferior hypokinesis, normal RV size and systolic function.  - Painful gynecomastia with spironolactone  and eplerenone . - Echo (3/21): EF 40-45%, mild LVH, normal RV  - Echo (5/22): EF 45-50%, normal RV.  - Echo 7/23 showed EF 50-55%, grade I DD, normal RV - Echo (9/24): EF 50% with basal inferior/basal to mid inferoseptal severe hypokinesis, mildly decreased RV systolic function.  5.  COPD: PFTs (9/19) with moderate obstruction.  6. PAD: ABIs (3/21) with 0.59 on right, 0.8 on left.  - ABIs (5/22): stable mild disease on right and moderate on left.  - ABIs (7/23): moderate right and left disease - ABIs (7/24): 0.67 right, 0.75 left 7. Spinal osteomyelitis: Streptococcus mitis, 8/22  SH: Married, lives in [place].  He quit smoking in 6/19.    Family History  Problem Relation Age of Onset   Heart disease Mother    Cancer Mother    Heart attack Father     ROS: All systems reviewed and negative except as per HPI.   Current Outpatient Medications  Medication Sig Dispense Refill   aspirin  EC 81 MG tablet Take 1 tablet (81 mg total) by mouth daily. Swallow whole. 90 tablet 3   atorvastatin  (LIPITOR) 40 MG tablet TAKE 1 TABLET(40 MG) BY MOUTH AT BEDTIME 90 tablet 3   carvedilol  (COREG ) 25 MG tablet TAKE 1 TABLET(25 MG) BY MOUTH TWICE DAILY 180 tablet 3   ENTRESTO  97-103 MG TAKE 1 TABLET BY MOUTH TWICE DAILY 60 tablet 11   finasteride (PROSCAR) 5 MG tablet Take 5 mg by mouth daily.     furosemide  (LASIX ) 20 MG tablet TAKE 1 TABLET(20 MG) BY MOUTH DAILY 90 tablet 3   icosapent  Ethyl (VASCEPA ) 1 g capsule Take 2 capsules (2 g total) by mouth 2 (two) times daily. 360 capsule 3   montelukast (SINGULAIR) 10 MG tablet Take 10 mg by mouth at bedtime.     Polyethyl Glycol-Propyl Glycol 0.4-0.3 % SOLN Place 1 drop into both eyes 2 (two) times daily.     No current facility-administered medications for this encounter.   Wt Readings from Last 3 Encounters:  08/12/23 98.4 kg (217 lb)  03/28/23 98.9 kg (218 lb)  11/24/22 97.7 kg (215 lb 6.4 oz)   BP (!) 128/56   Pulse (!) 57   Wt 98.4 kg (217 lb)   SpO2 93%   BMI 32.05 kg/m  Physical Exam General:  NAD. No resp difficulty HEENT: Normal Neck: Supple. No JVD. Cor: Regular rate & rhythm. No rubs, gallops or murmurs. Lungs: Clear Abdomen: Soft, nontender, nondistended.  Extremities: No cyanosis, clubbing, rash, edema Neuro: Alert & oriented x 3, moves all 4 extremities w/o difficulty. Affect pleasant.  Assessment/Plan: 1. CAD: LHC in 6/19 with CTOs of LAD and RCA.  Cardiac MRI showed significant myocardial viability. He is s/p CABG x 4 in 9/19.  No chest pain. - Continue ASA 81 and statin.  2. Chronic systolic CHF: Ischemic cardiomyopathy.  Echo in 6/19 with EF 25-20%.  Cardiac MRI in 7/19 with EF 28%, significant viability noted. RHC in 7/19 showed mildly elevated filling pressure and low  cardiac output. Now s/p CABG in 9/19.  Echo post-op in 12/19 showed EF up to 40%.  3/21 echo with EF up to 40-45%, and echo in 5/22 with EF 45-50%.  Echo 9/24 with EF 50% with basal inferior/basal to mid inferoseptal severe hypokinesis, mildly decreased RV systolic function. NYHA class II, not significantly volume overloaded on exam.  ReDs normal at 32%.  - Continue Entresto  97/103 bid.  BMET/BNP today. - Continue Coreg  25 mg bid.  - Continue Lasix  20 mg daily, suspect ankle edema is venous insufficiency.  - Painful gynecomastia with spironolactone  and eplerenone .  - Did not tolerate Farxiga  due to urinary urgency.  3. COPD: He has quit smoking since 6/19.  Has chronic cough.  4. Hyperlipidemia: Continue atorvastatin  and Vascepa , good  lipids in 5/24.     5. PAD: No claudication with history of significant CAD. ABI (7/24) showed moderately abnormal bilateral ABI.  No current claudication symptoms or rest pain.  - Will refer to Dr. Darron if symptoms occur.   Follow up in 4 months with Dr. Rolan Raisin [facility] FNP-BC 08/12/2023

## 2023-08-29 ENCOUNTER — Other Ambulatory Visit (HOSPITAL_COMMUNITY): Payer: Self-pay | Admitting: Cardiology

## 2023-12-21 DIAGNOSIS — I130 Hypertensive heart and chronic kidney disease with heart failure and stage 1 through stage 4 chronic kidney disease, or unspecified chronic kidney disease: Secondary | ICD-10-CM | POA: Diagnosis not present

## 2023-12-21 DIAGNOSIS — I5022 Chronic systolic (congestive) heart failure: Secondary | ICD-10-CM | POA: Diagnosis not present

## 2023-12-21 DIAGNOSIS — E1151 Type 2 diabetes mellitus with diabetic peripheral angiopathy without gangrene: Secondary | ICD-10-CM | POA: Diagnosis not present

## 2023-12-21 DIAGNOSIS — R972 Elevated prostate specific antigen [PSA]: Secondary | ICD-10-CM | POA: Diagnosis not present

## 2023-12-21 DIAGNOSIS — N1831 Chronic kidney disease, stage 3a: Secondary | ICD-10-CM | POA: Diagnosis not present

## 2023-12-21 DIAGNOSIS — E785 Hyperlipidemia, unspecified: Secondary | ICD-10-CM | POA: Diagnosis not present

## 2023-12-28 DIAGNOSIS — I739 Peripheral vascular disease, unspecified: Secondary | ICD-10-CM | POA: Diagnosis not present

## 2023-12-28 DIAGNOSIS — Z0000 Encounter for general adult medical examination without abnormal findings: Secondary | ICD-10-CM | POA: Diagnosis not present

## 2023-12-28 DIAGNOSIS — I130 Hypertensive heart and chronic kidney disease with heart failure and stage 1 through stage 4 chronic kidney disease, or unspecified chronic kidney disease: Secondary | ICD-10-CM | POA: Diagnosis not present

## 2023-12-28 DIAGNOSIS — I25810 Atherosclerosis of coronary artery bypass graft(s) without angina pectoris: Secondary | ICD-10-CM | POA: Diagnosis not present

## 2023-12-28 DIAGNOSIS — N1831 Chronic kidney disease, stage 3a: Secondary | ICD-10-CM | POA: Diagnosis not present

## 2023-12-28 DIAGNOSIS — Z1331 Encounter for screening for depression: Secondary | ICD-10-CM | POA: Diagnosis not present

## 2023-12-28 DIAGNOSIS — E1151 Type 2 diabetes mellitus with diabetic peripheral angiopathy without gangrene: Secondary | ICD-10-CM | POA: Diagnosis not present

## 2023-12-28 DIAGNOSIS — J449 Chronic obstructive pulmonary disease, unspecified: Secondary | ICD-10-CM | POA: Diagnosis not present

## 2023-12-28 DIAGNOSIS — E1122 Type 2 diabetes mellitus with diabetic chronic kidney disease: Secondary | ICD-10-CM | POA: Diagnosis not present

## 2023-12-28 DIAGNOSIS — I5022 Chronic systolic (congestive) heart failure: Secondary | ICD-10-CM | POA: Diagnosis not present

## 2023-12-28 DIAGNOSIS — R82998 Other abnormal findings in urine: Secondary | ICD-10-CM | POA: Diagnosis not present

## 2023-12-28 DIAGNOSIS — Z1339 Encounter for screening examination for other mental health and behavioral disorders: Secondary | ICD-10-CM | POA: Diagnosis not present

## 2024-01-09 DIAGNOSIS — R972 Elevated prostate specific antigen [PSA]: Secondary | ICD-10-CM | POA: Diagnosis not present

## 2024-01-16 ENCOUNTER — Telehealth (HOSPITAL_COMMUNITY): Payer: Self-pay | Admitting: Cardiology

## 2024-01-16 NOTE — Telephone Encounter (Signed)
 Called to confirm/remind patient of their appointment at the [facility] on 01/16/2024.   Appointment:   [x] Confirmed  [] Left mess   [] No answer/No voice mail  [] VM Full/unable to leave message  [] Phone not in service  Patient reminded to bring all medications and/or complete list.  Confirmed patient has transportation. Gave directions, instructed to utilize valet parking.

## 2024-01-17 ENCOUNTER — Encounter (HOSPITAL_COMMUNITY): Payer: Self-pay | Admitting: Cardiology

## 2024-01-17 ENCOUNTER — Ambulatory Visit (HOSPITAL_COMMUNITY)
Admission: RE | Admit: 2024-01-17 | Discharge: 2024-01-17 | Disposition: A | Discharge status: Home / Self Care | Source: Ambulatory Visit | Attending: Cardiology | Admitting: Cardiology

## 2024-01-17 ENCOUNTER — Ambulatory Visit (HOSPITAL_COMMUNITY): Payer: Self-pay | Admitting: Cardiology

## 2024-01-17 VITALS — BP 130/70 | HR 62 | Wt 217.4 lb

## 2024-01-17 DIAGNOSIS — Z79899 Other long term (current) drug therapy: Secondary | ICD-10-CM | POA: Insufficient documentation

## 2024-01-17 DIAGNOSIS — I5022 Chronic systolic (congestive) heart failure: Secondary | ICD-10-CM | POA: Diagnosis not present

## 2024-01-17 DIAGNOSIS — N401 Enlarged prostate with lower urinary tract symptoms: Secondary | ICD-10-CM | POA: Diagnosis not present

## 2024-01-17 DIAGNOSIS — Z87891 Personal history of nicotine dependence: Secondary | ICD-10-CM | POA: Diagnosis not present

## 2024-01-17 DIAGNOSIS — Z7982 Long term (current) use of aspirin: Secondary | ICD-10-CM | POA: Insufficient documentation

## 2024-01-17 DIAGNOSIS — I252 Old myocardial infarction: Secondary | ICD-10-CM | POA: Insufficient documentation

## 2024-01-17 DIAGNOSIS — N62 Hypertrophy of breast: Secondary | ICD-10-CM | POA: Diagnosis not present

## 2024-01-17 DIAGNOSIS — I2510 Atherosclerotic heart disease of native coronary artery without angina pectoris: Secondary | ICD-10-CM | POA: Insufficient documentation

## 2024-01-17 DIAGNOSIS — J449 Chronic obstructive pulmonary disease, unspecified: Secondary | ICD-10-CM | POA: Diagnosis not present

## 2024-01-17 DIAGNOSIS — I739 Peripheral vascular disease, unspecified: Secondary | ICD-10-CM | POA: Diagnosis not present

## 2024-01-17 DIAGNOSIS — I255 Ischemic cardiomyopathy: Secondary | ICD-10-CM | POA: Diagnosis not present

## 2024-01-17 DIAGNOSIS — Z951 Presence of aortocoronary bypass graft: Secondary | ICD-10-CM | POA: Insufficient documentation

## 2024-01-17 DIAGNOSIS — I447 Left bundle-branch block, unspecified: Secondary | ICD-10-CM | POA: Diagnosis not present

## 2024-01-17 DIAGNOSIS — E785 Hyperlipidemia, unspecified: Secondary | ICD-10-CM | POA: Diagnosis not present

## 2024-01-17 DIAGNOSIS — R350 Frequency of micturition: Secondary | ICD-10-CM | POA: Diagnosis not present

## 2024-01-17 DIAGNOSIS — R972 Elevated prostate specific antigen [PSA]: Secondary | ICD-10-CM | POA: Diagnosis not present

## 2024-01-17 LAB — BASIC METABOLIC PANEL WITH GFR
Anion gap: 10 (ref 5–15)
BUN: 16 mg/dL (ref 8–23)
CO2: 29 mmol/L (ref 22–32)
Calcium: 9.3 mg/dL (ref 8.9–10.3)
Chloride: 101 mmol/L (ref 98–111)
Creatinine, Ser: 1.14 mg/dL (ref 0.61–1.24)
GFR, Estimated: >60 mL/min (ref >60)
Glucose, Bld: 117 mg/dL — ABNORMAL HIGH (ref 70–99)
Potassium: 4.8 mmol/L (ref 3.5–5.1)
Sodium: 140 mmol/L (ref 135–145)

## 2024-01-17 LAB — BRAIN NATRIURETIC PEPTIDE: B Natriuretic Peptide: 129.5 pg/mL — ABNORMAL HIGH (ref 0.0–100.0)

## 2024-01-17 NOTE — Patient Instructions (Signed)
 Good to see you today!   Labs done today, your results will be available in MyChart, we will contact you for abnormal readings.  Your physician has requested that you have an ankle brachial index (ABI). During this test an ultrasound and blood pressure cuff are used to evaluate the arteries that supply the arms and legs with blood. Allow thirty minutes for this exam. There are no restrictions or special instructions.  Please note: We ask at that you not bring children with you during ultrasound (echo/ vascular) testing. Due to room size and safety concerns, children are not allowed in the ultrasound rooms during exams. Our front office staff cannot provide observation of children in our lobby area while testing is being conducted. An adult accompanying a patient to their appointment will only be allowed in the ultrasound room at the discretion of the ultrasound technician under special circumstances. We apologize for any inconvenience.   Your physician has requested that you have an echocardiogram. Echocardiography is a painless test that uses sound waves to create images of your heart. It provides your doctor with information about the size and shape of your heart and how well your heart's chambers and valves are working. This procedure takes approximately one hour. There are no restrictions for this procedure. Please do NOT wear cologne, perfume, aftershave, or lotions (deodorant is allowed). Please arrive 15 minutes prior to your appointment time.  Please note: We ask at that you not bring children with you during ultrasound (echo/ vascular) testing. Due to room size and safety concerns, children are not allowed in the ultrasound rooms during exams. Our front office staff cannot provide observation of children in our lobby area while testing is being conducted. An adult accompanying a patient to their appointment will only be allowed in the ultrasound room at the discretion of the ultrasound  technician under special circumstances. We apologize for any inconvenience.  Your physician recommends that you schedule a follow-up appointment in: AS SCEDULED If you have any questions or concerns before your next appointment please send us  a message through [place] or call our office at [phone].    TO LEAVE A MESSAGE FOR THE NURSE SELECT OPTION 2, PLEASE LEAVE A MESSAGE INCLUDING: YOUR NAME DATE OF BIRTH CALL BACK NUMBER REASON FOR CALL**this is important as we prioritize the call backs  YOU WILL RECEIVE A CALL BACK THE SAME DAY AS LONG AS YOU CALL BEFORE 4:00 PM At the [facility], you and your health needs are our priority. As part of our continuing mission to provide you with exceptional heart care, we have created designated Provider Care Teams. These Care Teams include your primary Cardiologist (physician) and Advanced Practice Providers (APPs- Physician Assistants and Nurse Practitioners) who all work together to provide you with the care you need, when you need it.   You may see any of the following providers on your designated Care Team at your next follow up: Dr Toribio Fuel Dr Ezra Shuck Dr. Ria Commander Dr. Morene Brownie Amy Lenetta, NP Caffie Shed, [place] [facility] [place], [place] Beckey Coe, NP [place] Lee, NP Ellouise Class, NP Tinnie Redman, PharmD Jaun Bash, PharmD   Please be sure to bring in all your medications bottles to every appointment.    Thank you for choosing [facility]

## 2024-01-17 NOTE — Progress Notes (Signed)
 HF Cardiology: Dr. Rolan PCP: Dr. Nichole  Chief complaint: CHF  83 y.o. with history of CAD and ischemic cardiomyopathy presents for followup of CHF. Patient had no cardiac history prior to 6/19. However, since around 12/18, he had noted exertional dyspnea.  In 6/19, [place]  He developed very severe dyspnea and went to the hospital in [place] where he was found to have NSTEMI.  LHC was done, showing chronic occlusions of the LAD and RCA with collaterals.  Echo sh//owed EF 25-30%.  No intervention, he was eventually discharged to come home.  He has been seen by Dr. Fleeta [name] for CABG evaluation.  Cardiac MRI in 6/19 showed EF 28% with substantial viability.  RHC was done in 7/19, showing mildly elevated filling pressures but low cardiac output, with CI 1.7.  After RHC, I increased his Lasix  and added digoxin .    Of note, he has quit smoking since 6/19.   He was admitted for CABG in 9/19.  Volume overload post-op, treated with diuresis.    Echo in 12/19 showed EF 40%, mid anteroseptal and mid inferoseptal akinesis.   He had painful gynecomastia with spironolactone  and switched to eplerenone  but did not resolve.  Now off eplerenone  with resolution of symptoms.   Echo in 3/21 showed EF 40-45%, mild LVH, normal RV.   ABIs in 3/21 showed moderate PAD, he saw Dr. Wadie with plan for conservative management for now.   He had COVID-19 in 2/22.   Echo in 5/22 showed EF up to 45-50%, normal RV.  ABIs in 5/22 with unchanged mild disease on right and moderate on left.   Spinal osteomyelitis in 8/22 with Strep mitis.   Echo 7/23 showed EF 50-55%, grade I DD, normal RV.  Echo 9/24 showed EF 50% with basal inferior/basal to mid inferoseptal severe hypokinesis, mildly decreased RV systolic function.   Today he returns for HF follow up. Still working 2 days/week. Short of breath if he hurries.  Generally does ok with 1 flight of stairs.  No chest pain.  No  lightheadedness or palpitations. No claudication-type pain in his legs but he gets some tingling and burning in his feet bilaterally.   ECG (personally reviewed): NSR, LBBB 144 msec  Labs (5/24): LDL 41, BNP 139, K 4, creatinine 1.1 Labs (9/24): K 4.1, creatinine 1.15 Labs (6/25): LDL 30, K 4.3, creatinine 1.0  PMH: 1. H/o retinal detachment 2. H/o transverse myelitis (remote) 3. CAD: NSTEMI 6/19 in [place]. - LHC (6/19): Chronic total occlusion of LAD and chronic total occlusion of RCA with collaterals.  - CABG (9/19): LIMA-LAD, SVG-D, SVG-ramus, SVG-PDA.  4. Chronic systolic CHF: Ischemic cardiomyopathy.   - Echo (6/19): LV moderately dilated, EF 25-30%, mild MR.  - Cardiac MRI (6/19): EF 28%, significant viability noted.  - RHC (7/19): mean RA 8, PA 40/16 mean 26, mean PCWP 23, CI 1.7, PVR 0.83 WU - Echo (12/19): EF 40%, mid inferoseptal and mid anteroseptal akinesis, inferior hypokinesis, normal RV size and systolic function.  - Painful gynecomastia with spironolactone  and eplerenone . - Echo (3/21): EF 40-45%, mild LVH, normal RV  - Echo (5/22): EF 45-50%, normal RV.  - Echo 7/23 showed EF 50-55%, grade I DD, normal RV - Echo (9/24): EF 50% with basal inferior/basal to mid inferoseptal severe hypokinesis, mildly decreased RV systolic function.  5. COPD: PFTs (9/19) with moderate obstruction.  6. PAD: ABIs (3/21) with 0.59 on right, 0.8 on left.  - ABIs (5/22): stable  mild disease on right and moderate on left.  - ABIs (7/23): moderate right and left disease - ABIs (7/24): 0.67 right, 0.75 left 7. Spinal osteomyelitis: Streptococcus mitis, 8/22  SH: Married, lives in [place].  He quit smoking in 6/19.    Family History  Problem Relation Age of Onset   Heart disease Mother    Cancer Mother    Heart attack Father    ROS: All systems reviewed and negative except as per HPI.   Current Outpatient Medications  Medication Sig Dispense Refill   aspirin  EC 81 MG tablet  Take 1 tablet (81 mg total) by mouth daily. Swallow whole. 90 tablet 3   atorvastatin  (LIPITOR) 40 MG tablet TAKE 1 TABLET(40 MG) BY MOUTH AT BEDTIME 90 tablet 3   carvedilol  (COREG ) 25 MG tablet TAKE 1 TABLET(25 MG) BY MOUTH TWICE DAILY 180 tablet 3   ENTRESTO  97-103 MG TAKE 1 TABLET BY MOUTH TWICE DAILY 60 tablet 11   finasteride (PROSCAR) 5 MG tablet Take 5 mg by mouth daily.     furosemide  (LASIX ) 20 MG tablet TAKE 1 TABLET(20 MG) BY MOUTH DAILY 90 tablet 3   montelukast (SINGULAIR) 10 MG tablet Take 10 mg by mouth at bedtime.     Polyethyl Glycol-Propyl Glycol 0.4-0.3 % SOLN Place 1 drop into both eyes 2 (two) times daily.     VASCEPA  1 g capsule TAKE 2 CAPSULES(2 GRAMS) BY MOUTH TWICE DAILY 360 capsule 3   No current facility-administered medications for this encounter.   Wt Readings from Last 3 Encounters:  01/17/24 98.6 kg (217 lb 6.4 oz)  08/12/23 98.4 kg (217 lb)  03/28/23 98.9 kg (218 lb)   BP 130/70   Pulse 62   Wt 98.6 kg (217 lb 6.4 oz)   SpO2 94%   BMI 32.10 kg/m  General: NAD Neck: No JVD, no thyromegaly or thyroid  nodule.  Lungs: Clear to auscultation bilaterally with normal respiratory effort. CV: Nondisplaced PMI.  Heart regular S1/S2, no S3/S4, no murmur.  1+ ankle edema L>R.  No carotid bruit. Unable to palpate pedal pulses.  Abdomen: Soft, nontender, no hepatosplenomegaly, no distention.  Skin: Intact without lesions or rashes.  Neurologic: Alert and oriented x 3.  Psych: Normal affect. Extremities: No clubbing or cyanosis.  HEENT: Normal.   Assessment/Plan: 1. CAD: LHC in 6/19 with CTOs of LAD and RCA.  Cardiac MRI showed significant myocardial viability. He is s/p CABG x 4 in 9/19.  No chest pain. - Continue ASA 81 and statin.  2. Chronic systolic CHF: Ischemic cardiomyopathy.  Echo in 6/19 with EF 25-20%.  Cardiac MRI in 7/19 with EF 28%, significant viability noted. RHC in 7/19 showed mildly elevated filling pressure and low cardiac output. Now s/p CABG  in 9/19.  Echo post-op in 12/19 showed EF up to 40%.  3/21 echo with EF up to 40-45%, and echo in 5/22 with EF 45-50%.  Echo 9/24 with EF 50% with basal inferior/basal to mid inferoseptal severe hypokinesis, mildly decreased RV systolic function. NYHA class II, he has some peripheral edema but suspect this is venous insufficiency.  No JVD.  - Continue Entresto  97/103 bid.  BMET/BNP today. - Continue Coreg  25 mg bid.  - Continue Lasix  20 mg daily.  - Painful gynecomastia with spironolactone  and eplerenone .  - Did not tolerate Farxiga  due to urinary urgency.  - I will arrange for repeat echo in 9/25.  3. COPD: He has quit smoking since 6/19.   4. Hyperlipidemia: Continue  atorvastatin  and Vascepa .  Good lipids in 6/25.     5. PAD: No claudication with history of significant CAD. ABI (7/24) showed moderately abnormal bilateral ABI.  No current claudication symptoms, suspect that tingling/burning in his feet is due to neuropathy.   - Repeat ABIs this year.   Follow up in 6 months with APP.   I spent 31 minutes reviewing records, interviewing/examining patient, and managing orders.   Robert [name]  01/17/2024

## 2024-02-07 ENCOUNTER — Other Ambulatory Visit (HOSPITAL_COMMUNITY): Payer: Self-pay | Admitting: Cardiology

## 2024-02-08 DIAGNOSIS — H26491 Other secondary cataract, right eye: Secondary | ICD-10-CM | POA: Diagnosis not present

## 2024-02-08 DIAGNOSIS — H35373 Puckering of macula, bilateral: Secondary | ICD-10-CM | POA: Diagnosis not present

## 2024-02-08 DIAGNOSIS — H35343 Macular cyst, hole, or pseudohole, bilateral: Secondary | ICD-10-CM | POA: Diagnosis not present

## 2024-02-08 DIAGNOSIS — H31093 Other chorioretinal scars, bilateral: Secondary | ICD-10-CM | POA: Diagnosis not present

## 2024-02-08 DIAGNOSIS — Z961 Presence of intraocular lens: Secondary | ICD-10-CM | POA: Diagnosis not present

## 2024-02-10 ENCOUNTER — Ambulatory Visit (HOSPITAL_COMMUNITY)
Admission: RE | Admit: 2024-02-10 | Discharge: 2024-02-10 | Disposition: A | Discharge status: Home / Self Care | Source: Ambulatory Visit | Attending: Cardiology | Admitting: Cardiology

## 2024-02-10 DIAGNOSIS — I739 Peripheral vascular disease, unspecified: Secondary | ICD-10-CM | POA: Insufficient documentation

## 2024-02-11 LAB — VAS US ABI WITH/WO TBI
Left ABI: 0.78
Right ABI: 0.56

## 2024-02-15 NOTE — Progress Notes (Signed)
 Patient notified

## 2024-02-23 ENCOUNTER — Other Ambulatory Visit: Payer: Self-pay | Admitting: Cardiology

## 2024-03-16 ENCOUNTER — Ambulatory Visit (HOSPITAL_COMMUNITY)
Admission: RE | Admit: 2024-03-16 | Discharge: 2024-03-16 | Disposition: A | Discharge status: Home / Self Care | Source: Ambulatory Visit | Attending: Cardiology | Admitting: Cardiology

## 2024-03-16 DIAGNOSIS — I2510 Atherosclerotic heart disease of native coronary artery without angina pectoris: Secondary | ICD-10-CM | POA: Insufficient documentation

## 2024-03-16 DIAGNOSIS — I358 Other nonrheumatic aortic valve disorders: Secondary | ICD-10-CM | POA: Diagnosis not present

## 2024-03-16 DIAGNOSIS — I5022 Chronic systolic (congestive) heart failure: Secondary | ICD-10-CM | POA: Insufficient documentation

## 2024-03-16 LAB — ECHOCARDIOGRAM COMPLETE
Area-P 1/2: 3.87 cm2
Calc EF: 44.8 %
S' Lateral: 4.3 cm
Single Plane A2C EF: 50.1 %
Single Plane A4C EF: 35.9 %

## 2024-03-16 NOTE — Progress Notes (Signed)
  Echocardiogram 2D Echocardiogram has been performed.  Robert [name] 03/16/2024, 1:21 PM

## 2024-03-19 ENCOUNTER — Telehealth (HOSPITAL_COMMUNITY): Payer: Self-pay | Admitting: *Deleted

## 2024-03-19 NOTE — Telephone Encounter (Signed)
 Called patient per Dr. Rolan with following echo results:  LV function is lower than in the past, 35-40%. Any chest pain? Would arrange followup.  Pt verbalized understanding of above; denies any chest pain. Appointment made to see Dr. Rolan 04/18/24 at 10:40. Asked patient to call us  if any issues prior to that appointment.

## 2024-04-18 ENCOUNTER — Encounter (HOSPITAL_COMMUNITY): Admitting: Cardiology

## 2024-05-01 ENCOUNTER — Telehealth (HOSPITAL_COMMUNITY): Payer: Self-pay | Admitting: Cardiology

## 2024-05-01 NOTE — Telephone Encounter (Signed)
 Called to confirm/remind patient of their appointment at [facility] on 05/01/24.   Appointment:   [] Confirmed  [] Left mess   [] No answer/No voice mail  [] VM Full/unable to leave message  [] Phone not in service  Patient reminded to bring all medications and/or complete list.  Confirmed patient has transportation. Gave directions, instructed to utilize valet parking.

## 2024-05-02 ENCOUNTER — Encounter (HOSPITAL_COMMUNITY): Payer: Self-pay | Admitting: Cardiology

## 2024-05-02 ENCOUNTER — Ambulatory Visit (HOSPITAL_COMMUNITY)
Admission: RE | Admit: 2024-05-02 | Discharge: 2024-05-02 | Disposition: A | Discharge status: Home / Self Care | Source: Ambulatory Visit | Attending: Cardiology | Admitting: Cardiology

## 2024-05-02 ENCOUNTER — Ambulatory Visit (HOSPITAL_COMMUNITY): Payer: Self-pay | Admitting: Cardiology

## 2024-05-02 VITALS — BP 152/70 | HR 59 | Ht 69.0 in | Wt 222.6 lb

## 2024-05-02 DIAGNOSIS — Z951 Presence of aortocoronary bypass graft: Secondary | ICD-10-CM | POA: Diagnosis not present

## 2024-05-02 DIAGNOSIS — Z87891 Personal history of nicotine dependence: Secondary | ICD-10-CM | POA: Insufficient documentation

## 2024-05-02 DIAGNOSIS — I5022 Chronic systolic (congestive) heart failure: Secondary | ICD-10-CM

## 2024-05-02 DIAGNOSIS — I739 Peripheral vascular disease, unspecified: Secondary | ICD-10-CM | POA: Diagnosis not present

## 2024-05-02 DIAGNOSIS — E785 Hyperlipidemia, unspecified: Secondary | ICD-10-CM | POA: Insufficient documentation

## 2024-05-02 DIAGNOSIS — I2510 Atherosclerotic heart disease of native coronary artery without angina pectoris: Secondary | ICD-10-CM

## 2024-05-02 DIAGNOSIS — J449 Chronic obstructive pulmonary disease, unspecified: Secondary | ICD-10-CM | POA: Insufficient documentation

## 2024-05-02 DIAGNOSIS — I255 Ischemic cardiomyopathy: Secondary | ICD-10-CM | POA: Insufficient documentation

## 2024-05-02 DIAGNOSIS — Z7982 Long term (current) use of aspirin: Secondary | ICD-10-CM | POA: Diagnosis not present

## 2024-05-02 DIAGNOSIS — I252 Old myocardial infarction: Secondary | ICD-10-CM | POA: Insufficient documentation

## 2024-05-02 DIAGNOSIS — I509 Heart failure, unspecified: Secondary | ICD-10-CM | POA: Diagnosis present

## 2024-05-02 DIAGNOSIS — Z79899 Other long term (current) drug therapy: Secondary | ICD-10-CM | POA: Diagnosis not present

## 2024-05-02 LAB — BRAIN NATRIURETIC PEPTIDE: B Natriuretic Peptide: 283 pg/mL — ABNORMAL HIGH (ref 0.0–100.0)

## 2024-05-02 LAB — LIPID PANEL
Cholesterol: 87 mg/dL (ref 0–200)
HDL: 29 mg/dL — ABNORMAL LOW (ref >40)
LDL Cholesterol: 37 mg/dL (ref 0–99)
Total CHOL/HDL Ratio: 3 ratio
Triglycerides: 103 mg/dL (ref <150)
VLDL: 21 mg/dL (ref 0–40)

## 2024-05-02 LAB — BASIC METABOLIC PANEL WITH GFR
Anion gap: 17 — ABNORMAL HIGH (ref 5–15)
BUN: 17 mg/dL (ref 8–23)
CO2: 26 mmol/L (ref 22–32)
Calcium: 8.9 mg/dL (ref 8.9–10.3)
Chloride: 98 mmol/L (ref 98–111)
Creatinine, Ser: 1.03 mg/dL (ref 0.61–1.24)
GFR, Estimated: >60 mL/min (ref >60)
Glucose, Bld: 94 mg/dL (ref 70–99)
Potassium: 4.8 mmol/L (ref 3.5–5.1)
Sodium: 141 mmol/L (ref 135–145)

## 2024-05-02 MED ORDER — FUROSEMIDE 20 MG PO TABS: 40.0000 mg | ORAL_TABLET | Freq: Every day | ORAL | 5 refills | Status: DC

## 2024-05-02 MED ORDER — EPLERENONE 25 MG PO TABS: 25.0000 mg | ORAL_TABLET | Freq: Every day | ORAL | 5 refills | Status: DC

## 2024-05-02 NOTE — Progress Notes (Signed)
 HF Cardiology: Dr. Rolan PCP: Dr. Nichole  Chief complaint: CHF  83 y.o. with history of CAD and ischemic cardiomyopathy presents for followup of CHF. Patient had no cardiac history prior to 6/19. However, since around 12/18, he had noted exertional dyspnea.  In 6/19, he was [place] the [place].  He developed very severe dyspnea and went to the hospital in [place] where he was found to have NSTEMI.  LHC was done, showing chronic occlusions of the LAD and RCA with collaterals.  Echo sh//owed EF 25-30%.  No intervention, he was eventually discharged to come home.  He has been seen by Dr. Fleeta Ochoa for CABG evaluation.  Cardiac MRI in 6/19 showed EF 28% with substantial viability.  RHC was done in 7/19, showing mildly elevated filling pressures but low cardiac output, with CI 1.7.  After RHC, I increased his Lasix  and added digoxin .    Of note, he has quit smoking since 6/19.   He was admitted for CABG in 9/19.  Volume overload post-op, treated with diuresis.    Echo in 12/19 showed EF 40%, mid anteroseptal and mid inferoseptal akinesis.   He had painful gynecomastia with spironolactone  and switched to eplerenone  but did not resolve.  Now off eplerenone  with resolution of symptoms.   Echo in 3/21 showed EF 40-45%, mild LVH, normal RV.   ABIs in 3/21 showed moderate PAD, he saw Dr. Wadie with plan for conservative management for now.   He had COVID-19 in 2/22.   Echo in 5/22 showed EF up to 45-50%, normal RV.  ABIs in 5/22 with unchanged mild disease on right and moderate on left.   Spinal osteomyelitis in 8/22 with Strep mitis.   Echo 7/23 showed EF 50-55%, grade I DD, normal RV.  Echo 9/24 showed EF 50% with basal inferior/basal to mid inferoseptal severe hypokinesis, mildly decreased RV systolic function.   Echo in 9/25 showed EF lower at 35-40% with septal, apical and basal inferior akinesis, mild RV dysfunction, IVC normal.   Today he returns for HF follow up. Still  working 2 days/week. Short of breath with hurrying.  Uses exercise bike.  Can climb a flight of stairs with no problems. Weight up about 5 lbs.  No chest pain.  No orthopnea/PND.  No typical claudication but he continues to have neuropathic-type pain in feet.   ECG (personally reviewed): NSR, LBBB 148 msec  Labs (5/24): LDL 41, BNP 139, K 4, creatinine 1.1 Labs (9/24): K 4.1, creatinine 1.15 Labs (6/25): LDL 30, K 4.3, creatinine 1.0 Labs (7/25): BNP 130, K 4.8, creatinine 1.14  PMH: 1. H/o retinal detachment 2. H/o transverse myelitis (remote) 3. CAD: NSTEMI 6/19 in [place]. - LHC (6/19): Chronic total occlusion of LAD and chronic total occlusion of RCA with collaterals.  - CABG (9/19): LIMA-LAD, SVG-D, SVG-ramus, SVG-PDA.  4. Chronic systolic CHF: Ischemic cardiomyopathy.   - Echo (6/19): LV moderately dilated, EF 25-30%, mild MR.  - Cardiac MRI (6/19): EF 28%, significant viability noted.  - RHC (7/19): mean RA 8, PA 40/16 mean 26, mean PCWP 23, CI 1.7, PVR 0.83 WU - Echo (12/19): EF 40%, mid inferoseptal and mid anteroseptal akinesis, inferior hypokinesis, normal RV size and systolic function.  - Painful gynecomastia with spironolactone  and eplerenone . - Echo (3/21): EF 40-45%, mild LVH, normal RV  - Echo (5/22): EF 45-50%, normal RV.  - Echo 7/23 showed EF 50-55%, grade I DD, normal RV - Echo (9/24): EF 50% with basal inferior/basal to mid  inferoseptal severe hypokinesis, mildly decreased RV systolic function.  - Echo (9/25): EF lower at 35-40% with septal, apical and basal inferior akinesis, mild RV dysfunction, IVC normal.  5. COPD: PFTs (9/19) with moderate obstruction.  6. PAD: ABIs (3/21) with 0.59 on right, 0.8 on left.  - ABIs (5/22): stable mild disease on right and moderate on left.  - ABIs (7/23): moderate right and left disease - ABIs (7/24): 0.67 right, 0.75 left - ABIs (8/25): Moderately decreased bilaterally.  7. Spinal osteomyelitis: Streptococcus mitis,  8/22  SH: Married, lives in [place].  He quit smoking in 6/19.    Family History  Problem Relation Age of Onset   Heart disease Mother    Cancer Mother    Heart attack Father    ROS: All systems reviewed and negative except as per HPI.   Current Outpatient Medications  Medication Sig Dispense Refill   aspirin  EC 81 MG tablet Take 1 tablet (81 mg total) by mouth daily. Swallow whole. 90 tablet 3   atorvastatin  (LIPITOR) 40 MG tablet TAKE 1 TABLET(40 MG) BY MOUTH AT BEDTIME 90 tablet 3   carvedilol  (COREG ) 25 MG tablet TAKE 1 TABLET(25 MG) BY MOUTH TWICE DAILY 180 tablet 3   eplerenone  (INSPRA ) 25 MG tablet Take 1 tablet (25 mg total) by mouth daily. 30 tablet 5   finasteride (PROSCAR) 5 MG tablet Take 5 mg by mouth daily.     montelukast (SINGULAIR) 10 MG tablet Take 10 mg by mouth at bedtime.     Polyethyl Glycol-Propyl Glycol 0.4-0.3 % SOLN Place 1 drop into both eyes 2 (two) times daily.     sacubitril -valsartan  (ENTRESTO ) 97-103 MG TAKE 1 TABLET BY MOUTH TWICE DAILY 60 tablet 11   VASCEPA  1 g capsule TAKE 2 CAPSULES(2 GRAMS) BY MOUTH TWICE DAILY 360 capsule 3   furosemide  (LASIX ) 20 MG tablet Take 2 tablets (40 mg total) by mouth daily. 60 tablet 5   No current facility-administered medications for this encounter.   Wt Readings from Last 3 Encounters:  05/02/24 101 kg (222 lb 9.6 oz)  01/17/24 98.6 kg (217 lb 6.4 oz)  08/12/23 98.4 kg (217 lb)   BP (!) 152/70   Pulse (!) 59   Ht 5' 9 (1.753 m)   Wt 101 kg (222 lb 9.6 oz)   SpO2 96%   BMI 32.87 kg/m  General: NAD Neck: No JVD, no thyromegaly or thyroid  nodule.  Lungs: Distant BS CV: Nondisplaced PMI.  Heart regular S1/S2, no S3/S4, no murmur.  1+ edema 3/4 to knees.  No carotid bruit.  Normal pedal pulses.  Abdomen: Soft, nontender, no hepatosplenomegaly, no distention.  Skin: Intact without lesions or rashes.  Neurologic: Alert and oriented x 3.  Psych: Normal affect. Extremities: No clubbing or cyanosis.  HEENT:  Normal.   Assessment/Plan: 1. CAD: LHC in 6/19 with CTOs of LAD and RCA.  Cardiac MRI showed significant myocardial viability. He is s/p CABG x 4 in 9/19.  No chest pain but EF has dropped.  - Continue ASA 81 and statin.  - With fall in EF, I will arrange for cardiac PET to rule out significant ischemia.  2. Chronic systolic CHF: Ischemic cardiomyopathy.  Echo in 6/19 with EF 25-20%.  Cardiac MRI in 7/19 with EF 28%, significant viability noted. RHC in 7/19 showed mildly elevated filling pressure and low cardiac output. Now s/p CABG in 9/19.  Echo post-op in 12/19 showed EF up to 40%.  3/21 echo with EF up  to 40-45%, and echo in 5/22 with EF 45-50%.  Echo 9/24 with EF 50% with basal inferior/basal to mid inferoseptal severe hypokinesis, mildly decreased RV systolic function. Echo in 9/25 showed EF lower at 35-40% with septal, apical and basal inferior akinesis, mild RV dysfunction, IVC normal.  Symptoms are stable NYHA class II, possible mild volume overload with weight up.  No chest pain.  - Continue Entresto  97/103 bid.  BMET/BNP today. - Continue Coreg  25 mg bid.  - Increase Lasix  to 40 mg daily with BMET/BNP today and again in 10 days.   - Painful gynecomastia with spironolactone , also stopped eplerenone  but for less clear reasons.  I will try him again on eplerenone  25 mg daily.   - Did not tolerate Farxiga  due to urinary urgency.  - In future, may arrange for a cardiac MRI for better quantification of EF.  3. COPD: He has quit smoking since 6/19.   4. Hyperlipidemia: Continue atorvastatin  and Vascepa .   - Check lipids today.      5. PAD: No claudication with history of significant CAD. ABIs in 8/25 showed moderately abnormal bilateral ABI.  No current claudication symptoms, suspect that tingling/burning in his feet is due to neuropathy.    Follow up in 6 wks with APP.   I spent 32 minutes reviewing records, interviewing/examining patient, and managing orders.   Ezra Shuck   05/02/2024

## 2024-05-02 NOTE — Patient Instructions (Signed)
 Medication Changes:  START EPLERENONE  25MG  ONCE DAILY   INCREASE LASIX  (FUROSEMIDE ) TO 40MG  ONCE DAILY   Lab Work:  Labs done today, your results will be available in MyChart, we will contact you for abnormal readings.  AND THEN LABS AGAIN IN 10 DAYS AS SCHEDULED   Testing/Procedures:     Please report to Radiology at the [facility] Main Entrance 30 minutes early for your test.  [address] [place], [place] [address]  How to Prepare for Your Cardiac PET/CT Stress Test:  Nothing to eat or drink, except water , 3 hours prior to arrival time.  NO caffeine/decaffeinated products, or chocolate 12 hours prior to arrival. (Please note decaffeinated beverages (teas/coffees) still contain caffeine).  If you have caffeine within 12 hours prior, the test will need to be rescheduled.  Medication instructions: Do not take erectile dysfunction medications for 72 hours prior to test (sildenafil, tadalafil) Do not take nitrates (isosorbide  mononitrate, Ranexa) the day before or day of test Do not take tamsulosin the day before or morning of test Hold theophylline containing medications for 12 hours. Hold Dipyridamole 48 hours prior to the test.  Diabetic Preparation: If able to eat breakfast prior to 3 hour fasting, you may take all medications, including your insulin . Do not worry if you miss your breakfast dose of insulin  - start at your next meal. If you do not eat prior to 3 hour fast-Hold all diabetes (oral and insulin ) medications. Patients who wear a continuous glucose monitor MUST remove the device prior to scanning.  You may take your remaining medications with water .  NO perfume, cologne or lotion on chest or abdomen area. FEMALES - Please avoid wearing dresses to this appointment.  Total time is 1 to 2 hours; you may want to bring reading material for the waiting time.  IF YOU THINK YOU MAY BE PREGNANT, OR ARE NURSING PLEASE INFORM THE TECHNOLOGIST.  In  preparation for your appointment, medication and supplies will be purchased.  Appointment availability is limited, so if you need to cancel or reschedule, please call the Radiology Department Scheduler at [phone] 24 hours in advance to avoid a cancellation fee of $100.00  What to Expect When you Arrive:  Once you arrive and check in for your appointment, you will be taken to a preparation room within the Radiology Department.  A technologist or Nurse will obtain your medical history, verify that you are correctly prepped for the exam, and explain the procedure.  Afterwards, an IV will be started in your arm and electrodes will be placed on your skin for EKG monitoring during the stress portion of the exam. Then you will be escorted to the PET/CT scanner.  There, staff will get you positioned on the scanner and obtain a blood pressure and EKG.  During the exam, you will continue to be connected to the EKG and blood pressure machines.  A small, safe amount of a radioactive tracer will be injected in your IV to obtain a series of pictures of your heart along with an injection of a stress agent.    After your Exam:  It is recommended that you eat a meal and drink a caffeinated beverage to counter act any effects of the stress agent.  Drink plenty of fluids for the remainder of the day and urinate frequently for the first couple of hours after the exam.  Your doctor will inform you of your test results within 7-10 business days.  For more information and  frequently asked questions, please visit our website: [email or web address]  For questions about your test or how to prepare for your test, please call: Cardiac Imaging Nurse Navigators Office: [phone]  Follow-Up in: 6 WEEKS AS SCHEDULED WITH APP CLINIC  At the [facility], you and your health needs are our priority. We have a designated team specialized in the treatment of Heart Failure. This Care Team includes  your primary Heart Failure Specialized Cardiologist (physician), Advanced Practice Providers (APPs- Physician Assistants and Nurse Practitioners), and Pharmacist who all work together to provide you with the care you need, when you need it.   You may see any of the following providers on your designated Care Team at your next follow up:  Dr. Toribio Fuel Dr. Ezra Shuck Dr. Odis Brownie Greig Mosses, NP Caffie Shed, [place] [facility] [place], [place] Beckey Coe, NP Jordan Lee, NP Tinnie Redman, PharmD   Please be sure to bring in all your medications bottles to every appointment.   Need to Contact Us :  If you have any questions or concerns before your next appointment please send us  a message through [place] or call our office at [phone].    TO LEAVE A MESSAGE FOR THE NURSE SELECT OPTION 2, PLEASE LEAVE A MESSAGE INCLUDING: YOUR NAME DATE OF BIRTH CALL BACK NUMBER REASON FOR CALL**this is important as we prioritize the call backs  YOU WILL RECEIVE A CALL BACK THE SAME DAY AS LONG AS YOU CALL BEFORE 4:00 PM

## 2024-05-03 NOTE — Addendum Note (Signed)
 Encounter addended by: Alesia Oshields B, RN on: 05/03/2024 8:06 AM  Actions taken: Order list changed

## 2024-05-09 ENCOUNTER — Other Ambulatory Visit (HOSPITAL_COMMUNITY): Payer: Self-pay | Admitting: Cardiology

## 2024-05-09 MED ORDER — EPLERENONE 25 MG PO TABS: 25.0000 mg | ORAL_TABLET | Freq: Every day | ORAL | 5 refills | Status: DC

## 2024-05-10 NOTE — Addendum Note (Signed)
 Encounter addended by: Rolan Ezra RAMAN, MD on: 05/10/2024 1:29 PM  Actions taken: Visit diagnoses modified, Order list changed, Diagnosis association updated

## 2024-05-14 ENCOUNTER — Ambulatory Visit (HOSPITAL_COMMUNITY)

## 2024-05-18 ENCOUNTER — Ambulatory Visit (HOSPITAL_COMMUNITY)
Admission: RE | Admit: 2024-05-18 | Discharge: 2024-05-18 | Disposition: A | Discharge status: Home / Self Care | Source: Ambulatory Visit | Attending: Internal Medicine | Admitting: Internal Medicine

## 2024-05-18 DIAGNOSIS — I5022 Chronic systolic (congestive) heart failure: Secondary | ICD-10-CM | POA: Diagnosis not present

## 2024-05-18 LAB — BASIC METABOLIC PANEL WITH GFR
Anion gap: 11 (ref 5–15)
BUN: 30 mg/dL — ABNORMAL HIGH (ref 8–23)
CO2: 27 mmol/L (ref 22–32)
Calcium: 8.6 mg/dL — ABNORMAL LOW (ref 8.9–10.3)
Chloride: 102 mmol/L (ref 98–111)
Creatinine, Ser: 1.44 mg/dL — ABNORMAL HIGH (ref 0.61–1.24)
GFR, Estimated: 49 mL/min — ABNORMAL LOW (ref >60)
Glucose, Bld: 95 mg/dL (ref 70–99)
Potassium: 4.5 mmol/L (ref 3.5–5.1)
Sodium: 140 mmol/L (ref 135–145)

## 2024-05-23 ENCOUNTER — Other Ambulatory Visit (HOSPITAL_COMMUNITY): Payer: Self-pay | Admitting: Cardiology

## 2024-05-30 ENCOUNTER — Ambulatory Visit (HOSPITAL_COMMUNITY)

## 2024-06-04 ENCOUNTER — Encounter (INDEPENDENT_AMBULATORY_CARE_PROVIDER_SITE_OTHER): Payer: Medicare HMO | Admitting: Ophthalmology

## 2024-06-04 DEATH — deceased

## 2024-06-06 ENCOUNTER — Ambulatory Visit (HOSPITAL_COMMUNITY)

## 2024-06-13 ENCOUNTER — Ambulatory Visit (HOSPITAL_COMMUNITY)

## 2024-07-13 ENCOUNTER — Encounter (HOSPITAL_COMMUNITY)
